# Patient Record
Sex: Female | Born: 1990 | Race: White | Hispanic: No | Marital: Married | Smoking: Former smoker
Health system: Southern US, Community
[De-identification: ages and names within clinical notes are randomized; demographics above are authoritative.]

## PROBLEM LIST (undated history)

## (undated) ENCOUNTER — Inpatient Hospital Stay (HOSPITAL_COMMUNITY): Payer: Self-pay

## (undated) DIAGNOSIS — F50029 Anorexia nervosa, binge eating/purging type, unspecified: Secondary | ICD-10-CM

## (undated) DIAGNOSIS — F5002 Anorexia nervosa, binge eating/purging type: Secondary | ICD-10-CM

## (undated) DIAGNOSIS — F32A Depression, unspecified: Secondary | ICD-10-CM

## (undated) DIAGNOSIS — F329 Major depressive disorder, single episode, unspecified: Secondary | ICD-10-CM

## (undated) DIAGNOSIS — E669 Obesity, unspecified: Secondary | ICD-10-CM

## (undated) DIAGNOSIS — B1920 Unspecified viral hepatitis C without hepatic coma: Secondary | ICD-10-CM

## (undated) DIAGNOSIS — F419 Anxiety disorder, unspecified: Secondary | ICD-10-CM

## (undated) DIAGNOSIS — N750 Cyst of Bartholin's gland: Secondary | ICD-10-CM

## (undated) DIAGNOSIS — E119 Type 2 diabetes mellitus without complications: Secondary | ICD-10-CM

## (undated) DIAGNOSIS — F4310 Post-traumatic stress disorder, unspecified: Secondary | ICD-10-CM

## (undated) HISTORY — PX: TONSILLECTOMY: SUR1361

## (undated) HISTORY — DX: Post-traumatic stress disorder, unspecified: F43.10

## (undated) HISTORY — DX: Cyst of Bartholin's gland: N75.0

## (undated) HISTORY — PX: WISDOM TOOTH EXTRACTION: SHX21

## (undated) HISTORY — DX: Unspecified viral hepatitis C without hepatic coma: B19.20

---
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
## 2012-05-31 ENCOUNTER — Telehealth (INDEPENDENT_AMBULATORY_CARE_PROVIDER_SITE_OTHER): Payer: Self-pay

## 2012-05-31 ENCOUNTER — Encounter (INDEPENDENT_AMBULATORY_CARE_PROVIDER_SITE_OTHER): Payer: Self-pay

## 2012-05-31 ENCOUNTER — Ambulatory Visit (INDEPENDENT_AMBULATORY_CARE_PROVIDER_SITE_OTHER): Payer: Medicaid Other | Admitting: General Surgery

## 2012-05-31 ENCOUNTER — Encounter (INDEPENDENT_AMBULATORY_CARE_PROVIDER_SITE_OTHER): Payer: Self-pay | Admitting: General Surgery

## 2012-05-31 VITALS — BP 126/78 | HR 86 | Resp 18 | Ht 67.0 in | Wt 289.0 lb

## 2012-05-31 DIAGNOSIS — G8929 Other chronic pain: Secondary | ICD-10-CM

## 2012-05-31 DIAGNOSIS — R1011 Right upper quadrant pain: Secondary | ICD-10-CM

## 2012-05-31 MED ORDER — OMEPRAZOLE 20 MG PO CPDR: 20.0000 mg | DELAYED_RELEASE_CAPSULE | Freq: Every day | ORAL | Status: DC

## 2012-05-31 NOTE — Telephone Encounter (Signed)
Called patient with appointment date & time for Hida Scan scheduled for Friday 06/10/12 @ 7:00 am, patient need's to arrive @ 6:45 am, nothing to eat or drink after midnight the night prior to the test also, patient cannot take any pain medication after midnight the night before the test.

## 2012-05-31 NOTE — Progress Notes (Signed)
Patient ID: Crystal Castillo, female   DOB: 10/31/1990, 22 y.o.   MRN: [id number]  Chief Complaint  Patient presents with  . Other    Eval gallbladder    HPI Crystal Castillo is a 22 y.o. female.  This patient was referred by Dr. Cyndia Castillo for evaluation of right upper quadrant pain. She first had this in October and was seen in the emergency room at that time. Ultrasound demonstrated some possible sludge but no obvious gallstones and she was discharged. The pain got better over the next several months but returned about one month ago and now she describes this as a constant "aching" pain located just under her right rib and breast. This is occasionally worsened with eating and is relieved with some Vicodin which was prescribed by her physician but she says that this does not help the nausea and which is associated with pain. She has vomited once. She denies any radiation of discomfort and says that her bowels are normal and denies any blood in stool or melena. She does have some occasional heartburn and takes some of her dad's Prilosec which helped with her heartburn symptoms but is unsure if this has helped with her pain since she does not take this routinely.  HPI  Past Medical History  Diagnosis Date  . Anorexia nervosa with bulimia     just finished treatment in August 2012 for this  . Diabetes mellitus     not currently treated for this. Pt states she was told she had DM, but lost @  150 lbs in 9 months.  . Depression   . Anxiety     Past Surgical History  Procedure Laterality Date  . Tonsillectomy    . Wisdom tooth extraction      History reviewed. No pertinent family history.  Social History History  Substance Use Topics  . Smoking status: Current Every Day Smoker -- 0.50 packs/day    Types: Cigarettes  . Smokeless tobacco: Never Used  . Alcohol Use: Yes     Comment: occasional drinker - 2 or 3 times a week    Allergies  Allergen Reactions  . Sulfa Antibiotics Nausea Only     Current Outpatient Prescriptions  Medication Sig Dispense Refill  . clonazePAM (KLONOPIN) 0.5 MG tablet Take 0.5 mg by mouth 2 (two) times daily as needed. anxiety      . topiramate (TOPAMAX) 100 MG tablet Take 100-200 mg by mouth 2 (two) times daily. Takes 100 mg in the morning and 200 mg at bedtime       . venlafaxine (EFFEXOR-XR) 150 MG 24 hr capsule Take 150 mg by mouth daily.      Marland Kitchen HYDROcodone-acetaminophen (NORCO/VICODIN) 5-325 MG per tablet Take 1 tablet by mouth every 8 (eight) hours as needed for pain.      Marland Kitchen omeprazole (PRILOSEC) 20 MG capsule Take 1 capsule (20 mg total) by mouth daily.  30 capsule  1   No current facility-administered medications for this visit.    Review of Systems Review of Systems All other review of systems negative or noncontributory except as stated in the HPI  Blood pressure 126/78, pulse 86, resp. rate 18, height 5\' 7"  (1.702 m), weight 289 lb (131.09 kg).  Physical Exam Physical Exam Physical Exam  Nursing note and vitals reviewed. Constitutional: She is oriented to person, place, and time. She appears well-developed and well-nourished. No distress.  HENT:  Head: Normocephalic and atraumatic.  Mouth/Throat: No oropharyngeal exudate.  Eyes: Conjunctivae  and EOM are normal. Pupils are equal, round, and reactive to light. Right eye exhibits no discharge. Left eye exhibits no discharge. No scleral icterus.  Neck: Normal range of motion. Neck supple. No tracheal deviation present.  Cardiovascular: Normal rate, regular rhythm, normal heart sounds and intact distal pulses.   Pulmonary/Chest: Effort normal and breath sounds normal. No stridor. No respiratory distress. She has no wheezes.  Abdominal: Soft. Bowel sounds are normal. She exhibits no distension and no mass. There is mild tenderness in the RUQ and right flank. There is no rebound and no guarding.  Musculoskeletal: Normal range of motion. She exhibits no edema and no tenderness.   Neurological: She is alert and oriented to person, place, and time.  Skin: Skin is warm and dry. No rash noted but she has several linear scars on her left arm. She is not diaphoretic. No erythema. No pallor.  Psychiatric: She has a normal mood and affect. Her behavior is normal. Judgment and thought content normal.    Data Reviewed [place], labs, outside records  Assessment    Abdominal pain, biliary dyskinesia GERD I do agree that this is most likely biliary dyskinesia or possibly due to GB sludge. This could be due topeptic ulcer disease or gastritis as well, given the absence of obvious gallstones. I think it is reasonable to obtain a HIDA scan to evaluate for gallbladder function and ejection fraction and if this is abnormal, proceed with cholecystectomy. However, she does have some reflux of air this could be gastritis as well and I will go ahead and prescribe some Prilosec In the meantime. I will see her back in about 2 weeks and we will see how she has done with the Prilosec. If she has improvement with Prilosec and her high scan is negative then we will hold off on surgery for now unless we can get some more objective evidence that this is the gallbladder. If she gets no improvement with Prilosec, and her HIDA scan is negative, then we will have to have a discussion about whether it is worth attempting cholecystectomy for possible relief.       Plan    Prilosec 20 mg daily HIDA scan Followup in 2 weeks        Genevive Printup DAVID 05/31/2012, 11:51 AM

## 2012-06-10 ENCOUNTER — Encounter (HOSPITAL_COMMUNITY)
Admission: RE | Admit: 2012-06-10 | Discharge: 2012-06-10 | Disposition: A | Discharge status: Home / Self Care | Payer: Medicaid Other | Source: Ambulatory Visit | Attending: General Surgery | Admitting: General Surgery

## 2012-06-10 DIAGNOSIS — R1011 Right upper quadrant pain: Secondary | ICD-10-CM

## 2012-06-10 DIAGNOSIS — R109 Unspecified abdominal pain: Secondary | ICD-10-CM | POA: Insufficient documentation

## 2012-06-10 MED ORDER — SINCALIDE 5 MCG IJ SOLR
INTRAMUSCULAR | Status: AC
  Administered 2012-06-10: 2.63 ug via INTRAVENOUS
  Filled 2012-06-10: qty 10

## 2012-06-10 MED ORDER — TECHNETIUM TC 99M MEBROFENIN IV KIT
5.0000 | PACK | Freq: Once | INTRAVENOUS | Status: AC | PRN
  Administered 2012-06-10: 5 via INTRAVENOUS

## 2012-06-10 MED ORDER — SINCALIDE 5 MCG IJ SOLR
0.0200 ug/kg | Freq: Once | INTRAMUSCULAR | Status: AC
  Administered 2012-06-10: 2.63 ug via INTRAVENOUS

## 2012-09-06 ENCOUNTER — Encounter (HOSPITAL_COMMUNITY): Payer: Self-pay | Admitting: Nurse Practitioner

## 2012-09-06 ENCOUNTER — Emergency Department (HOSPITAL_COMMUNITY): Payer: Medicaid Other

## 2012-09-06 ENCOUNTER — Emergency Department (HOSPITAL_COMMUNITY)
Admission: EM | Admit: 2012-09-06 | Discharge: 2012-09-06 | Disposition: A | Discharge status: Home / Self Care | Payer: Medicaid Other | Attending: Emergency Medicine | Admitting: Emergency Medicine

## 2012-09-06 DIAGNOSIS — R5381 Other malaise: Secondary | ICD-10-CM | POA: Insufficient documentation

## 2012-09-06 DIAGNOSIS — Z79899 Other long term (current) drug therapy: Secondary | ICD-10-CM | POA: Insufficient documentation

## 2012-09-06 DIAGNOSIS — R739 Hyperglycemia, unspecified: Secondary | ICD-10-CM

## 2012-09-06 DIAGNOSIS — R300 Dysuria: Secondary | ICD-10-CM | POA: Insufficient documentation

## 2012-09-06 DIAGNOSIS — R110 Nausea: Secondary | ICD-10-CM | POA: Insufficient documentation

## 2012-09-06 DIAGNOSIS — Z8659 Personal history of other mental and behavioral disorders: Secondary | ICD-10-CM | POA: Insufficient documentation

## 2012-09-06 DIAGNOSIS — R109 Unspecified abdominal pain: Secondary | ICD-10-CM | POA: Insufficient documentation

## 2012-09-06 DIAGNOSIS — Z3202 Encounter for pregnancy test, result negative: Secondary | ICD-10-CM | POA: Insufficient documentation

## 2012-09-06 DIAGNOSIS — N390 Urinary tract infection, site not specified: Secondary | ICD-10-CM | POA: Insufficient documentation

## 2012-09-06 DIAGNOSIS — M545 Low back pain, unspecified: Secondary | ICD-10-CM | POA: Insufficient documentation

## 2012-09-06 DIAGNOSIS — E1169 Type 2 diabetes mellitus with other specified complication: Secondary | ICD-10-CM | POA: Insufficient documentation

## 2012-09-06 DIAGNOSIS — F5000 Anorexia nervosa, unspecified: Secondary | ICD-10-CM | POA: Insufficient documentation

## 2012-09-06 DIAGNOSIS — R61 Generalized hyperhidrosis: Secondary | ICD-10-CM | POA: Insufficient documentation

## 2012-09-06 DIAGNOSIS — F17200 Nicotine dependence, unspecified, uncomplicated: Secondary | ICD-10-CM | POA: Insufficient documentation

## 2012-09-06 DIAGNOSIS — R0789 Other chest pain: Secondary | ICD-10-CM | POA: Insufficient documentation

## 2012-09-06 DIAGNOSIS — R6883 Chills (without fever): Secondary | ICD-10-CM | POA: Insufficient documentation

## 2012-09-06 LAB — URINE MICROSCOPIC-ADD ON

## 2012-09-06 LAB — POCT I-STAT, CHEM 8
Creatinine, Ser: 0.6 mg/dL (ref 0.50–1.10)
Glucose, Bld: 235 mg/dL — ABNORMAL HIGH (ref 70–99)
Hemoglobin: 13.3 g/dL (ref 12.0–15.0)
TCO2: 23 mmol/L (ref 0–100)

## 2012-09-06 LAB — CBC
Hemoglobin: 13.5 g/dL (ref 12.0–15.0)
MCH: 28.5 pg (ref 26.0–34.0)
Platelets: 256 10*3/uL (ref 150–400)
RBC: 4.73 MIL/uL (ref 3.87–5.11)
WBC: 9.6 10*3/uL (ref 4.0–10.5)

## 2012-09-06 LAB — URINALYSIS, ROUTINE W REFLEX MICROSCOPIC
Bilirubin Urine: NEGATIVE
Nitrite: NEGATIVE
Specific Gravity, Urine: 1.031 — ABNORMAL HIGH (ref 1.005–1.030)
Urobilinogen, UA: 0.2 mg/dL (ref 0.0–1.0)

## 2012-09-06 LAB — POCT I-STAT TROPONIN I: Troponin i, poc: 0 ng/mL (ref 0.00–0.08)

## 2012-09-06 LAB — COMPREHENSIVE METABOLIC PANEL
AST: 12 U/L (ref 0–37)
Albumin: 3.4 g/dL — ABNORMAL LOW (ref 3.5–5.2)
Alkaline Phosphatase: 88 U/L (ref 39–117)
BUN: 8 mg/dL (ref 6–23)
CO2: 22 mEq/L (ref 19–32)
Chloride: 98 mEq/L (ref 96–112)
GFR calc non Af Amer: >90 mL/min (ref >90)
Potassium: 3.9 mEq/L (ref 3.5–5.1)
Total Bilirubin: 0.3 mg/dL (ref 0.3–1.2)

## 2012-09-06 LAB — PRO B NATRIURETIC PEPTIDE: Pro B Natriuretic peptide (BNP): 55.4 pg/mL (ref 0–125)

## 2012-09-06 MED ORDER — IBUPROFEN 800 MG PO TABS: 800.0000 mg | ORAL_TABLET | Freq: Three times a day (TID) | ORAL | Status: DC

## 2012-09-06 MED ORDER — HYDROCODONE-ACETAMINOPHEN 5-325 MG PO TABS
1.0000 | ORAL_TABLET | Freq: Once | ORAL | Status: AC
  Administered 2012-09-06: 1 via ORAL
  Filled 2012-09-06: qty 1

## 2012-09-06 MED ORDER — SODIUM CHLORIDE 0.9 % IV BOLUS (SEPSIS)
1500.0000 mL | Freq: Once | INTRAVENOUS | Status: AC
  Administered 2012-09-06: 1500 mL via INTRAVENOUS

## 2012-09-06 MED ORDER — CYCLOBENZAPRINE HCL 10 MG PO TABS: 10.0000 mg | ORAL_TABLET | Freq: Two times a day (BID) | ORAL | Status: DC | PRN

## 2012-09-06 MED ORDER — METFORMIN HCL 500 MG PO TABS: 500.0000 mg | ORAL_TABLET | Freq: Two times a day (BID) | ORAL | Status: DC

## 2012-09-06 MED ORDER — CEPHALEXIN 500 MG PO CAPS: 500.0000 mg | ORAL_CAPSULE | Freq: Four times a day (QID) | ORAL | Status: DC

## 2012-09-06 MED ORDER — SODIUM CHLORIDE 0.9 % IV BOLUS (SEPSIS)
500.0000 mL | Freq: Once | INTRAVENOUS | Status: AC
  Administered 2012-09-06: 500 mL via INTRAVENOUS

## 2012-09-06 MED ORDER — HYDROCODONE-ACETAMINOPHEN 5-325 MG PO TABS
2.0000 | ORAL_TABLET | Freq: Once | ORAL | Status: AC
  Administered 2012-09-06: 2 via ORAL
  Filled 2012-09-06: qty 2

## 2012-09-06 NOTE — ED Provider Notes (Signed)
History    CSN: [id number] Arrival date & time 09/06/12  1429  First MD Initiated Contact with Patient 09/06/12 1508     Chief Complaint  Patient presents with  . Chest Pain    pain with deep breath   (Consider location/radiation/quality/duration/timing/severity/associated sxs/prior Treatment) Patient is a 22 y.o. female presenting with chest pain. The history is provided by the patient and medical records. No language interpreter was used.  Chest Pain Associated symptoms: back pain (low)   Associated symptoms: no abdominal pain, no cough, no diaphoresis, no fatigue, no fever, no headache, no nausea, no shortness of breath and not vomiting     Crystal Castillo is a 22 y.o. female  with a hx of DM (uncontrolled), bulimia, depression, anxiety presents to the Emergency Department complaining of gradual, persistent, progressively worsening riht sided chest pain which radiates to her back.  Pt states the pain began about 11PM while lying in bed withj associated sweating and chills, nausea, myalgias, diarrhea (x1 last PM with normal stool this AM).  Deep breathing increased her pain, and sleep  Made the pain better.  Pain persisted with breathing this morning and has persisted as an ache in the right side.  Pt also c/o dysuria, flank pain, low back pain for 2 weeks.  Vaginal wipes and pyridium both help with the symptoms and nothing makes them worse.  Pt denies fever, SOB, abd pain, nausea, vomiting, weakness, dizziness, syncope, hematuria.  Pt had nexplanon put in in May and has not had a period since that time.     Past Medical History  Diagnosis Date  . Anorexia nervosa with bulimia     just finished treatment in August 2012 for this  . Diabetes mellitus     not currently treated for this. Pt states she was told she had DM, but lost @  150 lbs in 9 months.  . Depression   . Anxiety    Past Surgical History  Procedure Laterality Date  . Tonsillectomy    . Wisdom tooth extraction      Family History  Problem Relation Age of Onset  . Diabetes Mother    History  Substance Use Topics  . Smoking status: Current Every Day Smoker -- 0.50 packs/day    Types: Cigarettes  . Smokeless tobacco: Never Used  . Alcohol Use: Yes     Comment: occasional drinker - 2 or 3 times a week   OB History   Grav Para Term Preterm Abortions TAB SAB Ect Mult Living            0     Review of Systems  Constitutional: Positive for chills. Negative for fever, diaphoresis, appetite change, fatigue and unexpected weight change.  HENT: Negative for mouth sores and neck stiffness.   Eyes: Negative for visual disturbance.  Respiratory: Negative for cough, chest tightness, shortness of breath and wheezing.   Cardiovascular: Positive for chest pain.  Gastrointestinal: Negative for nausea, vomiting, abdominal pain, diarrhea and constipation.  Endocrine: Negative for polydipsia, polyphagia and polyuria.  Genitourinary: Positive for dysuria, urgency, frequency and flank pain. Negative for hematuria.  Musculoskeletal: Positive for back pain (low).  Skin: Negative for rash.  Allergic/Immunologic: Negative for immunocompromised state.  Neurological: Negative for syncope, light-headedness and headaches.  Hematological: Does not bruise/bleed easily.  Psychiatric/Behavioral: Negative for sleep disturbance. The patient is not nervous/anxious.     Allergies  Sulfa antibiotics  Home Medications   Current Outpatient Rx  Name  Route  Sig  Dispense  Refill  . clonazePAM (KLONOPIN) 0.5 MG tablet   Oral   Take 0.5 mg by mouth 2 (two) times daily as needed for anxiety.          Marland Kitchen etonogestrel (NEXPLANON) 68 MG IMPL implant   Subcutaneous   Inject 1 each into the skin once.         . Multiple Vitamin (MULTIVITAMIN WITH MINERALS) TABS   Oral   Take 1 tablet by mouth every morning.         . naproxen sodium (ANAPROX) 220 MG tablet   Oral   Take 440 mg by mouth 2 (two) times daily as needed  (pain).         . cephALEXin (KEFLEX) 500 MG capsule   Oral   Take 1 capsule (500 mg total) by mouth 4 (four) times daily.   40 capsule   0   . cyclobenzaprine (FLEXERIL) 10 MG tablet   Oral   Take 1 tablet (10 mg total) by mouth 2 (two) times daily as needed for muscle spasms.   20 tablet   0   . ibuprofen (ADVIL,MOTRIN) 800 MG tablet   Oral   Take 1 tablet (800 mg total) by mouth 3 (three) times daily.   21 tablet   0   . metFORMIN (GLUCOPHAGE) 500 MG tablet   Oral   Take 1 tablet (500 mg total) by mouth 2 (two) times daily with a meal.   60 tablet   1    BP 130/54  Pulse 109  Temp(Src) 98.1 F (36.7 C) (Oral)  Resp 18  Ht 5\' 7"  (1.702 m)  Wt 300 lb (136.079 kg)  BMI 46.98 kg/m2  SpO2 96%  LMP 06/19/2012 Physical Exam  Nursing note and vitals reviewed. Constitutional: She is oriented to person, place, and time. She appears well-developed and well-nourished. No distress.  Awake, alert, nontoxic appearance Obese  HENT:  Head: Normocephalic and atraumatic.  Mouth/Throat: Oropharynx is clear and moist. No oropharyngeal exudate.  Eyes: Conjunctivae and EOM are normal. Pupils are equal, round, and reactive to light. No scleral icterus.  Neck: Normal range of motion. Neck supple.  Cardiovascular: Normal rate, regular rhythm, normal heart sounds and intact distal pulses.   No murmur heard. Pulmonary/Chest: Effort normal and breath sounds normal. No respiratory distress. She has no wheezes. She has no rales.  Abdominal: Soft. Bowel sounds are normal. She exhibits no distension and no mass. There is no tenderness. There is no rebound and no guarding.  Musculoskeletal: Normal range of motion. She exhibits no edema.  Neurological: She is alert and oriented to person, place, and time. She exhibits normal muscle tone. Coordination normal.  Speech is clear and goal oriented Moves extremities without ataxia  Skin: Skin is warm and dry. She is not diaphoretic.  Well-healed  marks from cutting on the left arm  Psychiatric: She has a normal mood and affect. Her behavior is normal.    ED Course  Procedures (including critical care time) Labs Reviewed  URINALYSIS, ROUTINE W REFLEX MICROSCOPIC - Abnormal; Notable for the following:    APPearance CLOUDY (*)    Specific Gravity, Urine 1.031 (*)    Glucose, UA >1000 (*)    Hgb urine dipstick TRACE (*)    Leukocytes, UA SMALL (*)    All other components within normal limits  COMPREHENSIVE METABOLIC PANEL - Abnormal; Notable for the following:    Glucose, Bld 378 (*)    Albumin 3.4 (*)  All other components within normal limits  POCT I-STAT, CHEM 8 - Abnormal; Notable for the following:    Glucose, Bld 235 (*)    Calcium, Ion 1.09 (*)    All other components within normal limits  CBC  PRO B NATRIURETIC PEPTIDE  URINE MICROSCOPIC-ADD ON  POCT PREGNANCY, URINE  POCT I-STAT TROPONIN I   ECG:  Date: 09/06/2012  Rate: 117  Rhythm: sinus tachycardia  QRS Axis: normal  Intervals: normal  ST/T Wave abnormalities: normal  Conduction Disutrbances:none  Narrative Interpretation: sinus tachycardia, nonischemic ECG, unchanged from preious on 12/09/09  Old EKG Reviewed: unchanged    Dg Chest 2 View  09/06/2012   *RADIOLOGY REPORT*  Clinical Data: Chest pain, shortness of breath  CHEST - 2 VIEW  Comparison:  None.  Findings:  The heart size and mediastinal contours are within normal limits.  Both lungs are clear.  The visualized skeletal structures are unremarkable.  IMPRESSION: No active cardiopulmonary disease.   Original Report Authenticated By: Judie Petit. Shick, M.D.   1. Hyperglycemia without ketosis   2. UTI (lower urinary tract infection)   3. Chest wall pain   4. Atypical chest pain     MDM  Crystal Castillo presents with chest pain made worse with movement.  Pt low risk for cardiac causes and likely chest wall pain based on presentation but will proceed with cardiac work-up.  Pt also with urinary complaints.   A-febrile, without CVA tenderness, unlikely pyelonephritis. Pt also with hx of DM, uncontrolled; will assess for DKA.  Will give fluids and pain control and reassess.    6:18 PM Patient with evidence of urinary tract infection.  BMP within normal limits, CBC without leukocytosis, troponin negative and ECG nonischemic, pregnancy test negative.  CMP with hyperglycemia 378 and anion gap of 16. No ketones and patient's urine. We'll give  Fluids.  Chest x-ray without evidence of infiltration, pneumothorax or cardiomegaly.      8:20 PM Pt with repeat chem 8 just prior to 2L bolus completion with AG of 13.  NO evidence of DKA.  Pt states her CP decreased after narcotic administration.  Will d/c home with medications for chest wall pain and have her f/u with PCP for glucose monitoring.  Will also begin pt with metformin.  Pt to be d/c with abx for UTI.  I personally reviewed the imaging tests through PACS system.  I reviewed available ER/hospitalization records through the EMR.    I explained the diagnosis and have given explicit precautions to return to the ER including for any other new or worsening symptoms. The patient understands and accepts the medical plan as it's been dictated and I have answered their questions. Discharge instructions concerning home care and prescriptions have been given. The patient is STABLE and is discharged to home in good condition.  Dr. Jeraldine Loots was consulted, evaluated this patient with me and agrees with the plan.      Dahlia Client Sylva Overley, PA-C 09/06/12 2021

## 2012-09-06 NOTE — ED Notes (Signed)
Patient transported to X-ray 

## 2012-09-06 NOTE — ED Notes (Addendum)
Per pt:  Last night around 11:30, pt began having sharp chest pains on the right side associated with some nausea, chills, sweating and malaise (achy).  Pt did not take any medication.  Pt states that she has had frequent small amounts of urination, lower back pain and cloudy urine; pt is to see her MD tomorrow for the possible UTI and regular check up.  Months ago, pt had a workup for possible "sludge" build up in gallbladder.  Pt had a test done and she never received information regarding the gallbladder test.  Pt is diabetic and has not been checking glucose for about a year.  Pt is concerned that the frequent urinating may be associated with diabetes.

## 2012-09-06 NOTE — ED Notes (Signed)
Spoke with Danielle in lab; she is running the CMP from the prior blood sent down.

## 2012-09-06 NOTE — ED Provider Notes (Signed)
Medical screening examination/treatment/procedure(s) were conducted as a shared visit with non-physician practitioner(s) and myself.  I personally evaluated the patient during the encounter On my exam this young F w awake, alert, but seemingly uncomfortable.  She had notable hyperglycemia, mild anion gap and glucosuria.  She received IVF, was improved, and d/c to f/u w PMD.  Gerhard Munch, MD 09/06/12 2211

## 2013-12-22 ENCOUNTER — Emergency Department (HOSPITAL_COMMUNITY)
Admission: EM | Admit: 2013-12-22 | Discharge: 2013-12-22 | Disposition: A | Discharge status: Home / Self Care | Payer: Self-pay | Attending: Emergency Medicine | Admitting: Emergency Medicine

## 2013-12-22 ENCOUNTER — Emergency Department (HOSPITAL_COMMUNITY): Payer: Self-pay

## 2013-12-22 ENCOUNTER — Encounter (HOSPITAL_COMMUNITY): Payer: Self-pay | Admitting: *Deleted

## 2013-12-22 DIAGNOSIS — E119 Type 2 diabetes mellitus without complications: Secondary | ICD-10-CM | POA: Insufficient documentation

## 2013-12-22 DIAGNOSIS — R05 Cough: Secondary | ICD-10-CM

## 2013-12-22 DIAGNOSIS — Z720 Tobacco use: Secondary | ICD-10-CM | POA: Insufficient documentation

## 2013-12-22 DIAGNOSIS — Z792 Long term (current) use of antibiotics: Secondary | ICD-10-CM | POA: Insufficient documentation

## 2013-12-22 DIAGNOSIS — J40 Bronchitis, not specified as acute or chronic: Secondary | ICD-10-CM

## 2013-12-22 DIAGNOSIS — R059 Cough, unspecified: Secondary | ICD-10-CM

## 2013-12-22 DIAGNOSIS — R112 Nausea with vomiting, unspecified: Secondary | ICD-10-CM | POA: Insufficient documentation

## 2013-12-22 DIAGNOSIS — Z79899 Other long term (current) drug therapy: Secondary | ICD-10-CM | POA: Insufficient documentation

## 2013-12-22 DIAGNOSIS — J209 Acute bronchitis, unspecified: Secondary | ICD-10-CM | POA: Insufficient documentation

## 2013-12-22 DIAGNOSIS — E669 Obesity, unspecified: Secondary | ICD-10-CM | POA: Insufficient documentation

## 2013-12-22 HISTORY — DX: Obesity, unspecified: E66.9

## 2013-12-22 HISTORY — DX: Type 2 diabetes mellitus without complications: E11.9

## 2013-12-22 LAB — CBG MONITORING, ED: GLUCOSE-CAPILLARY: 105 mg/dL — AB (ref 70–99)

## 2013-12-22 MED ORDER — HYDROCOD POLST-CHLORPHEN POLST 10-8 MG/5ML PO LQCR
5.0000 mL | Freq: Once | ORAL | Status: AC
  Administered 2013-12-22: 5 mL via ORAL

## 2013-12-22 MED ORDER — HYDROCOD POLST-CHLORPHEN POLST 10-8 MG/5ML PO LQCR
5.0000 mL | Freq: Three times a day (TID) | ORAL | Status: DC | PRN
  Filled 2013-12-22: qty 5

## 2013-12-22 MED ORDER — HYDROCODONE-HOMATROPINE 5-1.5 MG/5ML PO SYRP: 5.0000 mL | ORAL_SOLUTION | ORAL | Status: DC | PRN

## 2013-12-22 MED ORDER — CEPHALEXIN 500 MG PO CAPS: 500.0000 mg | ORAL_CAPSULE | Freq: Three times a day (TID) | ORAL | Status: DC

## 2013-12-22 MED ORDER — HYDROCODONE-HOMATROPINE 5-1.5 MG/5ML PO SYRP: 5.0000 mL | ORAL_SOLUTION | Freq: Four times a day (QID) | ORAL | Status: DC | PRN

## 2013-12-22 NOTE — Discharge Instructions (Signed)
Cough, Adult ° A cough is a reflex that helps clear your throat and airways. It can help heal the body or may be a reaction to an irritated airway. A cough may only last 2 or 3 weeks (acute) or may last more than 8 weeks (chronic).  °CAUSES °Acute cough: °· Viral or bacterial infections. °Chronic cough: °· Infections. °· Allergies. °· Asthma. °· Post-nasal drip. °· Smoking. °· Heartburn or acid reflux. °· Some medicines. °· Chronic lung problems (COPD). °· Cancer. °SYMPTOMS  °· Cough. °· Fever. °· Chest pain. °· Increased breathing rate. °· High-pitched whistling sound when breathing (wheezing). °· Colored mucus that you cough up (sputum). °TREATMENT  °· A bacterial cough may be treated with antibiotic medicine. °· A viral cough must run its course and will not respond to antibiotics. °· Your caregiver may recommend other treatments if you have a chronic cough. °HOME CARE INSTRUCTIONS  °· Only take over-the-counter or prescription medicines for pain, discomfort, or fever as directed by your caregiver. Use cough suppressants only as directed by your caregiver. °· Use a cold steam vaporizer or humidifier in your bedroom or home to help loosen secretions. °· Sleep in a semi-upright position if your cough is worse at night. °· Rest as needed. °· Stop smoking if you smoke. °SEEK IMMEDIATE MEDICAL CARE IF:  °· You have pus in your sputum. °· Your cough starts to worsen. °· You cannot control your cough with suppressants and are losing sleep. °· You begin coughing up blood. °· You have difficulty breathing. °· You develop pain which is getting worse or is uncontrolled with medicine. °· You have a fever. °MAKE SURE YOU:  °· Understand these instructions. °· Will watch your condition. °· Will get help right away if you are not doing well or get worse. °Document Released: 08/01/2010 Document Revised: 04/27/2011 Document Reviewed: 08/01/2010 °ExitCare® Patient Information ©2015 [employer]. This information is not intended  to replace advice given to you by your health care provider. Make sure you discuss any questions you have with your health care provider. ° °Fever, Adult °A fever is a higher than normal body temperature. In an adult, an oral temperature around 98.6° F (37° C) is considered normal. A temperature of 100.4° F (38° C) or higher is generally considered a fever. Mild or moderate fevers generally have no long-term effects and often do not require treatment. Extreme fever (greater than or equal to 106° F or 41.1° C) can cause seizures. The sweating that may occur with repeated or prolonged fever may cause dehydration. Elderly people can develop confusion during a fever. °A measured temperature can vary with: °· Age. °· Time of day. °· Method of measurement (mouth, underarm, rectal, or ear). °The fever is confirmed by taking a temperature with a thermometer. Temperatures can be taken different ways. Some methods are accurate and some are not. °· An oral temperature is used most commonly. Electronic thermometers are fast and accurate. °· An ear temperature will only be accurate if the thermometer is positioned as recommended by the manufacturer. °· A rectal temperature is accurate and done for those adults who have a condition where an oral temperature cannot be taken. °· An underarm (axillary) temperature is not accurate and not recommended. °Fever is a symptom, not a disease.  °CAUSES  °· Infections commonly cause fever. °· Some noninfectious causes for fever include: °¨ Some arthritis conditions. °¨ Some thyroid or adrenal gland conditions. °¨ Some immune system conditions. °¨ Some types of cancer. °¨   A medicine reaction. °¨ High doses of certain street drugs such as methamphetamine. °¨ Dehydration. °¨ Exposure to high outside or room temperatures. °· Occasionally, the source of a fever cannot be determined. This is sometimes called a "fever of unknown origin" (FUO). °· Some situations may lead to a temporary rise in body  temperature that may go away on its own. Examples are: °¨ Childbirth. °¨ Surgery. °¨ Intense exercise. °HOME CARE INSTRUCTIONS  °· Take appropriate medicines for fever. Follow dosing instructions carefully. If you use acetaminophen to reduce the fever, be careful to avoid taking other medicines that also contain acetaminophen. Do not take aspirin for a fever if you are younger than age 19. There is an association with Reye's syndrome. Reye's syndrome is a rare but potentially deadly disease. °· If an infection is present and antibiotics have been prescribed, take them as directed. Finish them even if you start to feel better. °· Rest as needed. °· Maintain an adequate fluid intake. To prevent dehydration during an illness with prolonged or recurrent fever, you may need to drink extra fluid. Drink enough fluids to keep your urine clear or pale yellow. °· Sponging or bathing with room temperature water may help reduce body temperature. Do not use ice water or alcohol sponge baths. °· Dress comfortably, but do not over-bundle. °SEEK MEDICAL CARE IF:  °· You are unable to keep fluids down. °· You develop vomiting or diarrhea. °· You are not feeling at least partly better after 3 days. °· You develop new symptoms or problems. °SEEK IMMEDIATE MEDICAL CARE IF:  °· You have shortness of breath or trouble breathing. °· You develop excessive weakness. °· You are dizzy or you faint. °· You are extremely thirsty or you are making little or no urine. °· You develop new pain that was not there before (such as in the head, neck, chest, back, or abdomen). °· You have persistent vomiting and diarrhea for more than 1 to 2 days. °· You develop a stiff neck or your eyes become sensitive to light. °· You develop a skin rash. °· You have a fever or persistent symptoms for more than 2 to 3 days. °· You have a fever and your symptoms suddenly get worse. °MAKE SURE YOU:  °· Understand these instructions. °· Will watch your  condition. °· Will get help right away if you are not doing well or get worse. °Document Released: 07/29/2000 Document Revised: 06/19/2013 Document Reviewed: 12/04/2010 °ExitCare® Patient Information ©2015 [employer]. This information is not intended to replace advice given to you by your health care provider. Make sure you discuss any questions you have with your health care provider. ° °

## 2013-12-22 NOTE — ED Provider Notes (Signed)
CSN: [id number]     Arrival date & time 12/22/13  1347 History   First MD Initiated Contact with Patient 12/22/13 1619     Chief Complaint  Patient presents with  . Cough  . Fever      HPI Pt reports not feeling well for several days. Having sore throat, fever/chills, bodyaches, n/v and productive cough with green sputum. Past Medical History  Diagnosis Date  . Obesity   . Diabetes mellitus without complication    History reviewed. No pertinent past surgical history. History reviewed. No pertinent family history. History  Substance Use Topics  . Smoking status: Current Every Day Smoker    Types: Cigarettes  . Smokeless tobacco: Not on file  . Alcohol Use: Yes     Comment: occ   OB History    No data available     Review of Systems  Constitutional: Negative for fever, chills and unexpected weight change.  HENT: Negative for rhinorrhea and sinus pressure.   Gastrointestinal: Negative for nausea and vomiting.  All other systems reviewed and are negative.    All other systems reviewed and are negative Allergies  Sulfa antibiotics  Home Medications   Prior to Admission medications   Medication Sig Start Date End Date Taking? Authorizing Provider  metFORMIN (GLUCOPHAGE) 500 MG tablet Take 500 mg by mouth 2 (two) times daily with a meal.   Yes Historical Provider, MD  cephALEXin (KEFLEX) 500 MG capsule Take 1 capsule (500 mg total) by mouth 3 (three) times daily. 12/22/13   Nelia Shiobert L Ancil Dewan, MD  HYDROcodone-homatropine [facility](HYCODAN) 5-1.5 MG/5ML syrup Take 5 mLs by mouth every 6 (six) hours as needed for cough. 12/22/13   Nelia Shiobert L Ciana Simmon, MD   BP 133/77 mmHg  Pulse 107  Temp(Src) 97.6 F (36.4 C) (Oral)  Resp 19  SpO2 96% Physical Exam Physical Exam  Nursing note and vitals reviewed. Constitutional: She is oriented to person, place, and time. She appears well-developed and well-nourished. No distress.  HENT:  Head: Normocephalic and atraumatic.  Eyes: Pupils are  equal, round, and reactive to light.  Neck: Normal range of motion.  Cardiovascular: Normal rate and intact distal pulses.   Pulmonary/Chest: No respiratory distress. scattered expiratory wheezes in all lung fields.  No use of accessory muscles. Abdominal: Normal appearance. She exhibits no distension.  Musculoskeletal: Normal range of motion.  Neurological: She is alert and oriented to person, place, and time. No cranial nerve deficit.  Skin: Skin is warm and dry. No rash noted.  Psychiatric: She has a normal mood and affect. Her behavior is normal.   ED Course  Procedures (including critical care time) Labs Review Labs Reviewed  CBG MONITORING, ED - Abnormal; Notable for the following:    Glucose-Capillary 105 (*)    All other components within normal limits    Imaging Review Results for orders placed or performed during the hospital encounter of 12/22/13  POC CBG, ED  Result Value Ref Range   Glucose-Capillary 105 (H) 70 - 99 mg/dL   Dg Chest 2 View  40/9/811911/07/2013   CLINICAL DATA:  Ten day history of cough, worsening over the last 4 days. Patient had fever several days ago. Patient also complaining of nausea and vomiting today. Sore throat, head pressure and dizziness today. History of hypertension, diabetes and smoking.  EXAM: CHEST  2 VIEW  COMPARISON:  None.  FINDINGS: Normal heart, mediastinum hila.  Lungs are clear.  No pleural effusion or pneumothorax.  Bony thorax is intact.  IMPRESSION: No active cardiopulmonary disease.   Electronically Signed   By: Amie Portlandavid  Ormond M.D.   On: 12/22/2013 14:59       MDM   Final diagnoses:  Bronchitis        Nelia Shiobert L Jordann Grime, MD 12/24/13 [phone]

## 2013-12-22 NOTE — ED Notes (Signed)
MD at bedside. 

## 2013-12-22 NOTE — ED Notes (Signed)
Pt reports not feeling well for several days. Having sore throat, fever/chills, bodyaches, n/v and productive cough with green sputum. Airway intact at triage.

## 2013-12-22 NOTE — ED Notes (Signed)
Pt getting undressed and into a gown at this time; family at bedside; Amy, NT and ED, RN aware

## 2014-01-05 ENCOUNTER — Encounter (HOSPITAL_COMMUNITY): Payer: Self-pay | Admitting: Nurse Practitioner

## 2015-01-09 ENCOUNTER — Emergency Department (HOSPITAL_COMMUNITY)
Admission: EM | Admit: 2015-01-09 | Discharge: 2015-01-09 | Disposition: A | Discharge status: Home / Self Care | Payer: Medicaid Other | Attending: Emergency Medicine | Admitting: Emergency Medicine

## 2015-01-09 ENCOUNTER — Encounter (HOSPITAL_COMMUNITY): Payer: Self-pay | Admitting: Emergency Medicine

## 2015-01-09 DIAGNOSIS — Z791 Long term (current) use of non-steroidal anti-inflammatories (NSAID): Secondary | ICD-10-CM | POA: Insufficient documentation

## 2015-01-09 DIAGNOSIS — Z3202 Encounter for pregnancy test, result negative: Secondary | ICD-10-CM | POA: Insufficient documentation

## 2015-01-09 DIAGNOSIS — M545 Low back pain, unspecified: Secondary | ICD-10-CM

## 2015-01-09 DIAGNOSIS — Z79899 Other long term (current) drug therapy: Secondary | ICD-10-CM | POA: Insufficient documentation

## 2015-01-09 DIAGNOSIS — F419 Anxiety disorder, unspecified: Secondary | ICD-10-CM | POA: Insufficient documentation

## 2015-01-09 DIAGNOSIS — F17210 Nicotine dependence, cigarettes, uncomplicated: Secondary | ICD-10-CM | POA: Insufficient documentation

## 2015-01-09 DIAGNOSIS — Z792 Long term (current) use of antibiotics: Secondary | ICD-10-CM | POA: Insufficient documentation

## 2015-01-09 DIAGNOSIS — F329 Major depressive disorder, single episode, unspecified: Secondary | ICD-10-CM | POA: Insufficient documentation

## 2015-01-09 DIAGNOSIS — R197 Diarrhea, unspecified: Secondary | ICD-10-CM | POA: Insufficient documentation

## 2015-01-09 DIAGNOSIS — R109 Unspecified abdominal pain: Secondary | ICD-10-CM | POA: Insufficient documentation

## 2015-01-09 DIAGNOSIS — R000 Tachycardia, unspecified: Secondary | ICD-10-CM | POA: Insufficient documentation

## 2015-01-09 DIAGNOSIS — R112 Nausea with vomiting, unspecified: Secondary | ICD-10-CM | POA: Insufficient documentation

## 2015-01-09 DIAGNOSIS — E119 Type 2 diabetes mellitus without complications: Secondary | ICD-10-CM | POA: Insufficient documentation

## 2015-01-09 LAB — BASIC METABOLIC PANEL
ANION GAP: 9 (ref 5–15)
BUN: 10 mg/dL (ref 6–20)
CALCIUM: 8.7 mg/dL — AB (ref 8.9–10.3)
CO2: 24 mmol/L (ref 22–32)
Chloride: 106 mmol/L (ref 101–111)
Creatinine, Ser: 0.58 mg/dL (ref 0.44–1.00)
Glucose, Bld: 148 mg/dL — ABNORMAL HIGH (ref 65–99)
POTASSIUM: 3.8 mmol/L (ref 3.5–5.1)
SODIUM: 139 mmol/L (ref 135–145)

## 2015-01-09 LAB — URINALYSIS, ROUTINE W REFLEX MICROSCOPIC
Bilirubin Urine: NEGATIVE
Glucose, UA: NEGATIVE mg/dL
Hgb urine dipstick: NEGATIVE
KETONES UR: NEGATIVE mg/dL
LEUKOCYTES UA: NEGATIVE
NITRITE: NEGATIVE
PROTEIN: NEGATIVE mg/dL
Specific Gravity, Urine: 1.031 — ABNORMAL HIGH (ref 1.005–1.030)
pH: 6.5 (ref 5.0–8.0)

## 2015-01-09 LAB — CBC
HEMATOCRIT: 44.4 % (ref 36.0–46.0)
HEMOGLOBIN: 15.3 g/dL — AB (ref 12.0–15.0)
MCH: 31.9 pg (ref 26.0–34.0)
MCHC: 34.5 g/dL (ref 30.0–36.0)
MCV: 92.7 fL (ref 78.0–100.0)
Platelets: 269 10*3/uL (ref 150–400)
RBC: 4.79 MIL/uL (ref 3.87–5.11)
RDW: 12.3 % (ref 11.5–15.5)
WBC: 10 10*3/uL (ref 4.0–10.5)

## 2015-01-09 LAB — I-STAT BETA HCG BLOOD, ED (MC, WL, AP ONLY)

## 2015-01-09 MED ORDER — MORPHINE SULFATE (PF) 4 MG/ML IV SOLN
4.0000 mg | Freq: Once | INTRAVENOUS | Status: AC
  Administered 2015-01-09: 4 mg via INTRAVENOUS
  Filled 2015-01-09: qty 1

## 2015-01-09 MED ORDER — SODIUM CHLORIDE 0.9 % IV BOLUS (SEPSIS)
1000.0000 mL | Freq: Once | INTRAVENOUS | Status: AC
  Administered 2015-01-09: 1000 mL via INTRAVENOUS

## 2015-01-09 MED ORDER — ONDANSETRON HCL 4 MG/2ML IJ SOLN
4.0000 mg | Freq: Once | INTRAMUSCULAR | Status: AC
  Administered 2015-01-09: 4 mg via INTRAVENOUS
  Filled 2015-01-09: qty 2

## 2015-01-09 MED ORDER — METHOCARBAMOL 500 MG PO TABS: 500.0000 mg | ORAL_TABLET | Freq: Two times a day (BID) | ORAL | Status: DC

## 2015-01-09 MED ORDER — NAPROXEN SODIUM 220 MG PO TABS: 440.0000 mg | ORAL_TABLET | Freq: Two times a day (BID) | ORAL | Status: DC | PRN

## 2015-01-09 NOTE — ED Notes (Signed)
Pt ambulated to RR unassisted 

## 2015-01-09 NOTE — ED Provider Notes (Signed)
CSN: [id number]     Arrival date & time 01/09/15  1124 History   First MD Initiated Contact with Patient 01/09/15 1140     Chief Complaint  Patient presents with  . Flank Pain  . Abdominal Pain  . Emesis  . Diarrhea     (Consider location/radiation/quality/duration/timing/severity/associated sxs/prior Treatment) HPI   24 year old obese female with history of non-insulin-dependent diabetes, depression, anxiety presenting with back pain. Patient woke up this morning with sharp pain to her right low back. Pain is persistent, worsening with movement and no specific treatment tried. Pain is currently moderate in intensity and nonradiating. For the past several days she has been having decrease in appetite with occasional nausea and vomiting. She denies having any fever, chills, lightheadedness, chest pain, shortness of breath, productive cough, dysuria, hematuria, vaginal bleeding, vaginal discharge. Her last menstrual period was November 11. Patient felt symptoms reminded her of a muscle pull but denies any specific injury. She report remote history of kidney injury due to anorexia.  Past Medical History  Diagnosis Date  . Anorexia nervosa with bulimia     just finished treatment in August 2012 for this  . Diabetes mellitus     not currently treated for this. Pt states she was told she had DM, but lost @  150 lbs in 9 months.  . Depression   . Anxiety   . Obesity   . Diabetes mellitus without complication [facility])    Past Surgical History  Procedure Laterality Date  . Tonsillectomy    . Wisdom tooth extraction     Family History  Problem Relation Age of Onset  . Diabetes Mother    Social History  Substance Use Topics  . Smoking status: Current Every Day Smoker -- 0.50 packs/day    Types: Cigarettes  . Smokeless tobacco: None  . Alcohol Use: Yes     Comment: occasional drinker - 2 or 3 times a week   OB History    Gravida Para Term Preterm AB TAB SAB Ectopic Multiple Living    0 0 0 0 0 0 0 0       Review of Systems  All other systems reviewed and are negative.     Allergies  Sulfa antibiotics and Sulfa antibiotics  Home Medications   Prior to Admission medications   Medication Sig Start Date End Date Taking? Authorizing Provider  cephALEXin (KEFLEX) 500 MG capsule Take 1 capsule (500 mg total) by mouth 4 (four) times daily. 09/06/12   Hannah Muthersbaugh, PA-C  cephALEXin (KEFLEX) 500 MG capsule Take 1 capsule (500 mg total) by mouth 3 (three) times daily. 12/22/13   Nelva Nay, MD  clonazePAM (KLONOPIN) 0.5 MG tablet Take 0.5 mg by mouth 2 (two) times daily as needed for anxiety.     Historical Provider, MD  cyclobenzaprine (FLEXERIL) 10 MG tablet Take 1 tablet (10 mg total) by mouth 2 (two) times daily as needed for muscle spasms. 09/06/12   Hannah Muthersbaugh, PA-C  etonogestrel (NEXPLANON) 68 MG IMPL implant Inject 1 each into the skin once.    Historical Provider, MD  HYDROcodone-homatropine (HYCODAN) 5-1.5 MG/5ML syrup Take 5 mLs by mouth every 6 (six) hours as needed for cough. 12/22/13   Nelva Nay, MD  ibuprofen (ADVIL,MOTRIN) 800 MG tablet Take 1 tablet (800 mg total) by mouth 3 (three) times daily. 09/06/12   Hannah Muthersbaugh, PA-C  metFORMIN (GLUCOPHAGE) 500 MG tablet Take 1 tablet (500 mg total) by mouth 2 (two) times daily with a  meal. 09/06/12   Hannah Muthersbaugh, PA-C  metFORMIN (GLUCOPHAGE) 500 MG tablet Take 500 mg by mouth 2 (two) times daily with a meal.    Historical Provider, MD  Multiple Vitamin (MULTIVITAMIN WITH MINERALS) TABS Take 1 tablet by mouth every morning.    Historical Provider, MD  naproxen sodium (ANAPROX) 220 MG tablet Take 440 mg by mouth 2 (two) times daily as needed (pain).    Historical Provider, MD   BP 134/91 mmHg  Pulse 105  Temp(Src) 97.7 F (36.5 C) (Oral)  Resp 18  SpO2 99% Physical Exam  Constitutional: She appears well-developed and well-nourished. No distress.  Morbidly obese Caucasian female,  nontoxic  HENT:  Head: Atraumatic.  Eyes: Conjunctivae are normal.  Neck: Neck supple.  Cardiovascular:  Mild tachycardia without murmur rubs or gallops  Pulmonary/Chest: Effort normal and breath sounds normal. No respiratory distress. She has no wheezes.  Abdominal: Soft. Bowel sounds are normal. She exhibits no distension. There is no tenderness. There is no rebound and no guarding.  Negative Murphy sign, no pain at McBurney's point  Genitourinary:  No CVA tenderness. Tenderness to right para spinal muscle on palpation  Neurological: She is alert.  Skin: No rash noted.  Psychiatric: She has a normal mood and affect.  Nursing note and vitals reviewed.   ED Course  Procedures (including critical care time)  Patient with reproducible right low back pain on exam. Does have decrease in appetite with nausea vomiting diarrhea. Suspect viral GI etiology. Workup initiated. Abdomen nontender on exam.  1:11 PM UA shows no evidence of urinary tract infection. Normal renal function. Labs are reassuring. Patient is not pregnant. She is able to ambulate without difficulty. Pain improves. Patient is stable for discharge. Return precaution discussed.  Labs Review Labs Reviewed  URINALYSIS, ROUTINE W REFLEX MICROSCOPIC (NOT AT [facility]) - Abnormal; Notable for the following:    Color, Urine AMBER (*)    APPearance HAZY (*)    Specific Gravity, Urine 1.031 (*)    All other components within normal limits  BASIC METABOLIC PANEL - Abnormal; Notable for the following:    Glucose, Bld 148 (*)    Calcium 8.7 (*)    All other components within normal limits  CBC - Abnormal; Notable for the following:    Hemoglobin 15.3 (*)    All other components within normal limits  I-STAT BETA HCG BLOOD, ED ([facility], [facility], [facility] ONLY)    Imaging Review No results found. I have personally reviewed and evaluated these images and lab results as part of my medical decision-making.   EKG Interpretation None      MDM    Final diagnoses:  Right-sided low back pain without sciatica    BP 134/91 mmHg  Pulse 105  Temp(Src) 97.7 F (36.5 C) (Oral)  Resp 18  SpO2 99%     Fayrene HelperBowie Coral Soler, PA-C 01/09/15 1312  Lorre NickAnthony Allen, MD 01/10/15 [phone]

## 2015-01-09 NOTE — Discharge Instructions (Signed)

## 2015-01-09 NOTE — ED Notes (Signed)
Woke up with excruciating lower right back pain. Hx of kidney problems. States the pain has been coming and going over the last week. Has lost appetite d/t nausea,vomiting, and diarrhea increasing over the last week. Flank and RLQ tenderness present on palpation.

## 2015-09-13 ENCOUNTER — Ambulatory Visit (INDEPENDENT_AMBULATORY_CARE_PROVIDER_SITE_OTHER): Payer: Self-pay | Admitting: Family Medicine

## 2015-09-13 ENCOUNTER — Encounter: Payer: Self-pay | Admitting: Family Medicine

## 2015-09-13 VITALS — BP 119/80 | HR 99 | Ht 67.0 in | Wt 186.7 lb

## 2015-09-13 DIAGNOSIS — L732 Hidradenitis suppurativa: Secondary | ICD-10-CM

## 2015-09-13 DIAGNOSIS — N390 Urinary tract infection, site not specified: Secondary | ICD-10-CM

## 2015-09-13 LAB — POCT URINALYSIS DIP (DEVICE)
BILIRUBIN URINE: NEGATIVE
GLUCOSE, UA: NEGATIVE mg/dL
KETONES UR: NEGATIVE mg/dL
Nitrite: NEGATIVE
PROTEIN: NEGATIVE mg/dL
SPECIFIC GRAVITY, URINE: 1.01 (ref 1.005–1.030)
Urobilinogen, UA: 1 mg/dL (ref 0.0–1.0)
pH: 6 (ref 5.0–8.0)

## 2015-09-13 MED ORDER — FLUCONAZOLE 150 MG PO TABS: ORAL_TABLET | ORAL | 2 refills | Status: DC

## 2015-09-13 MED ORDER — CIPROFLOXACIN HCL 500 MG PO TABS: 500.0000 mg | ORAL_TABLET | Freq: Two times a day (BID) | ORAL | 0 refills | Status: DC

## 2015-09-13 NOTE — Patient Instructions (Addendum)

## 2015-09-13 NOTE — Progress Notes (Signed)
   Subjective:    Patient ID: Crystal Castillo is a 25 y.o. female presenting with Other (Bartholin cyst)  on 09/13/2015  HPI: 3rd recurrence of Bartholin's since February. Seen in urgent care. Has h/o sebaceous cyst. No h/o MRSA. Also, c/o dysuria x 3 days. Has been on Abx and taken Diflucan with no improvement in dysuria. Also, has h/o UTI. Wants to stop Depo due to mood, bleeding profile. Uninterested in child-bearing at this time. Discussed IUD copper vs. Mirena  Review of Systems  Constitutional: Negative for chills and fever.  Respiratory: Negative for shortness of breath.   Cardiovascular: Negative for chest pain.  Gastrointestinal: Negative for abdominal pain, nausea and vomiting.  Genitourinary: Positive for dysuria and vaginal pain.  Skin: Negative for rash.      Objective:    BP 119/80   Pulse 99   Ht 5\' 7"  (1.702 m)   Wt 186 lb 11.2 oz (84.7 kg)   LMP 08/02/2015 Comment: bled for about 3-4 weeks  BMI 29.24 kg/m  Physical Exam  Constitutional: She is oriented to person, place, and time. She appears well-developed and well-nourished.  Pulmonary/Chest: Effort normal.  Genitourinary: Vagina normal.  Genitourinary Comments: Hidradenitis noted no Bartholin's and some excoriation noted  Neurological: She is alert and oriented to person, place, and time.  Skin:  Healed cuts on arms   Urinalysis    Component Value Date/Time   COLORURINE AMBER (A) 01/09/2015 1157   APPEARANCEUR HAZY (A) 01/09/2015 1157   LABSPEC 1.010 09/13/2015 0930   PHURINE 6.0 09/13/2015 0930   GLUCOSEU NEGATIVE 09/13/2015 0930   HGBUR TRACE (A) 09/13/2015 0930   BILIRUBINUR NEGATIVE 09/13/2015 0930   KETONESUR NEGATIVE 09/13/2015 0930   PROTEINUR NEGATIVE 09/13/2015 0930   UROBILINOGEN 1.0 09/13/2015 0930   NITRITE NEGATIVE 09/13/2015 0930   LEUKOCYTESUR LARGE (A) 09/13/2015 0930          Assessment & Plan:   Problem List Items Addressed This Visit      Unprioritized   Hidradenitis      Other Visit Diagnoses    UTI (lower urinary tract infection)    -  Primary   Relevant Medications   ciprofloxacin (CIPRO) 500 MG tablet   fluconazole (DIFLUCAN) 150 MG tablet   Other Relevant Orders   POCT urinalysis dip (device) (Completed)     May use Diflucan prn Total face-to-face time with patient: 30 minutes. Over 50% of encounter was spent on counseling and coordination of care. Follow up as needed To get pap smear with PCP May return for IUD insertion if desires.  PRATT,TANYA S 09/13/2015 9:22 AM

## 2015-09-13 NOTE — Progress Notes (Signed)
Already has a counselor that she sees occasionally, trying to save up money for psychiatrist, medical md managing meds

## 2015-09-13 NOTE — Assessment & Plan Note (Signed)
Antibacterial soap--handout given. Do no shave but trim hair.

## 2015-09-16 ENCOUNTER — Encounter (HOSPITAL_COMMUNITY): Payer: Self-pay | Admitting: Nurse Practitioner

## 2015-09-16 ENCOUNTER — Emergency Department (HOSPITAL_COMMUNITY)
Admission: EM | Admit: 2015-09-16 | Discharge: 2015-09-17 | Disposition: A | Discharge status: Home / Self Care | Payer: BLUE CROSS/BLUE SHIELD | Attending: Emergency Medicine | Admitting: Emergency Medicine

## 2015-09-16 DIAGNOSIS — E119 Type 2 diabetes mellitus without complications: Secondary | ICD-10-CM | POA: Diagnosis not present

## 2015-09-16 DIAGNOSIS — R1084 Generalized abdominal pain: Secondary | ICD-10-CM | POA: Diagnosis present

## 2015-09-16 DIAGNOSIS — K2970 Gastritis, unspecified, without bleeding: Secondary | ICD-10-CM | POA: Diagnosis not present

## 2015-09-16 DIAGNOSIS — Z87891 Personal history of nicotine dependence: Secondary | ICD-10-CM | POA: Insufficient documentation

## 2015-09-16 DIAGNOSIS — R112 Nausea with vomiting, unspecified: Secondary | ICD-10-CM

## 2015-09-16 LAB — URINALYSIS, ROUTINE W REFLEX MICROSCOPIC
GLUCOSE, UA: NEGATIVE mg/dL
HGB URINE DIPSTICK: NEGATIVE
Ketones, ur: >80 mg/dL — AB
Nitrite: NEGATIVE
PH: 5.5 (ref 5.0–8.0)
Protein, ur: 30 mg/dL — AB
SPECIFIC GRAVITY, URINE: 1.034 — AB (ref 1.005–1.030)

## 2015-09-16 LAB — COMPREHENSIVE METABOLIC PANEL
ALBUMIN: 4 g/dL (ref 3.5–5.0)
ALT: 18 U/L (ref 14–54)
ANION GAP: 13 (ref 5–15)
AST: 22 U/L (ref 15–41)
Alkaline Phosphatase: 45 U/L (ref 38–126)
BUN: <5 mg/dL — ABNORMAL LOW (ref 6–20)
CHLORIDE: 106 mmol/L (ref 101–111)
CO2: 20 mmol/L — AB (ref 22–32)
Calcium: 9.6 mg/dL (ref 8.9–10.3)
Creatinine, Ser: 0.69 mg/dL (ref 0.44–1.00)
GFR calc non Af Amer: >60 mL/min (ref >60)
Glucose, Bld: 81 mg/dL (ref 65–99)
Potassium: 3.5 mmol/L (ref 3.5–5.1)
SODIUM: 139 mmol/L (ref 135–145)
Total Bilirubin: 1.1 mg/dL (ref 0.3–1.2)
Total Protein: 6.9 g/dL (ref 6.5–8.1)

## 2015-09-16 LAB — CBC
HEMATOCRIT: 49.7 % — AB (ref 36.0–46.0)
HEMOGLOBIN: 17 g/dL — AB (ref 12.0–15.0)
MCH: 33.4 pg (ref 26.0–34.0)
MCHC: 34.2 g/dL (ref 30.0–36.0)
MCV: 97.6 fL (ref 78.0–100.0)
Platelets: 282 10*3/uL (ref 150–400)
RBC: 5.09 MIL/uL (ref 3.87–5.11)
RDW: 13.3 % (ref 11.5–15.5)
WBC: 10.9 10*3/uL — ABNORMAL HIGH (ref 4.0–10.5)

## 2015-09-16 LAB — I-STAT BETA HCG BLOOD, ED (MC, WL, AP ONLY)

## 2015-09-16 LAB — URINE MICROSCOPIC-ADD ON

## 2015-09-16 MED ORDER — SODIUM CHLORIDE 0.9 % IV BOLUS (SEPSIS)
1000.0000 mL | Freq: Once | INTRAVENOUS | Status: AC
  Administered 2015-09-16: 1000 mL via INTRAVENOUS

## 2015-09-16 MED ORDER — GI COCKTAIL ~~LOC~~
30.0000 mL | Freq: Once | ORAL | Status: AC
  Administered 2015-09-17: 30 mL via ORAL
  Filled 2015-09-16: qty 30

## 2015-09-16 MED ORDER — LORAZEPAM 2 MG/ML IJ SOLN
1.0000 mg | Freq: Once | INTRAMUSCULAR | Status: AC
  Administered 2015-09-17: 1 mg via INTRAVENOUS
  Filled 2015-09-16: qty 1

## 2015-09-16 MED ORDER — PROMETHAZINE HCL 25 MG/ML IJ SOLN
12.5000 mg | Freq: Once | INTRAMUSCULAR | Status: AC
  Administered 2015-09-16: 12.5 mg via INTRAVENOUS
  Filled 2015-09-16 (×2): qty 1

## 2015-09-16 NOTE — ED Triage Notes (Addendum)
She c/o waking this morning with abd pain. She reports n/v, sweats. She denies bowel, bladder changes. She reports poor oral intake this week. She tried zofran today with no relief. She is currently on abx for sebaceous cyst and UTI.She is alert and breathing easily

## 2015-09-16 NOTE — ED Provider Notes (Signed)
[facility]-EMERGENCY DEPT Provider Note   CSN: [id number] Arrival date & time: 09/16/15  1538  First Provider Contact:  First MD Initiated Contact with Patient 09/16/15 2138      History   Chief Complaint Chief Complaint  Patient presents with  . Abdominal Pain    HPI Crystal Castillo is a 25 y.o. female.  The history is provided by the patient. No language interpreter was used.  Abdominal Pain   This is a recurrent problem. The current episode started yesterday. The problem occurs rarely. The problem has not changed since onset.Associated with: taking medications on empty stomach. The pain is located in the generalized abdominal region. Associated symptoms include diarrhea, nausea and vomiting. Pertinent negatives include fever, melena, constipation, dysuria, frequency, hematuria, headaches, arthralgias and myalgias. Nothing aggravates the symptoms. Nothing relieves the symptoms. Past workup includes ultrasound.  Emesis   Associated symptoms include abdominal pain and diarrhea. Pertinent negatives include no arthralgias, no chills, no cough, no fever, no headaches and no myalgias.    Past Medical History:  Diagnosis Date  . Anorexia nervosa with bulimia    just finished treatment in August 2012 for this  . Anxiety   . Bartholin's cyst   . Depression   . Diabetes mellitus    not currently treated for this. Pt states she was told she had DM, but lost @  150 lbs in 9 months.  . Diabetes mellitus without complication ([name])   . Obesity   . PTSD (post-traumatic stress disorder)     Patient Active Problem List   Diagnosis Date Noted  . Hidradenitis 09/13/2015    Past Surgical History:  Procedure Laterality Date  . TONSILLECTOMY    . WISDOM TOOTH EXTRACTION      OB History    Gravida Para Term Preterm AB Living   0 0 0 0 0     SAB TAB Ectopic Multiple Live Births   0 0 0           Home Medications    Prior to Admission medications   Medication Sig Start Date End Date  Taking? Authorizing Provider  buPROPion (WELLBUTRIN SR) 150 MG 12 hr tablet Take 150 mg by mouth 2 (two) times daily.  07/18/15 07/17/16 Yes Historical Provider, MD  ciprofloxacin (CIPRO) 500 MG tablet Take 1 tablet (500 mg total) by mouth 2 (two) times daily. 09/13/15  Yes Reva Bores, MD  clonazePAM (KLONOPIN) 1 MG tablet TAKE ONE TABLET BY MOUTH THREE TIMES A DAY AS NEEDED FOR ANXIETY 08/15/15  Yes Historical Provider, MD  doxycycline (MONODOX) 100 MG capsule Take 100 mg by mouth 2 (two) times daily.  09/09/15 09/19/15 Yes Historical Provider, MD  fluconazole (DIFLUCAN) 150 MG tablet Take one tablet every third day if yeast infection develops. 09/13/15  Yes Reva Bores, MD  topiramate (TOPAMAX) 25 MG tablet TAKE TWO TABLETS (50 MG TOTAL) BY MOUTH 2 (TWO) TIMES DAILY. 07/18/15  Yes Historical Provider, MD  promethazine (PHENERGAN) 25 MG tablet Take 1 tablet (25 mg total) by mouth every 6 (six) hours as needed for nausea or vomiting. 09/17/15   Dan Humphreys, MD    Family History Family History  Problem Relation Age of Onset  . Diabetes Mother     Social History Social History  Substance Use Topics  . Smoking status: Former Smoker    Packs/day: 0.50    Types: Cigarettes  . Smokeless tobacco: Never Used     Comment: rarely smokes  . Alcohol  use Yes     Comment: occasional drinker - 2 or 3 times a week     Allergies   Quetiapine; Sulfa antibiotics; and Sulfa antibiotics   Review of Systems Review of Systems  Constitutional: Negative for chills and fever.  HENT: Negative for ear pain and sore throat.   Eyes: Negative for pain and visual disturbance.  Respiratory: Negative for cough and shortness of breath.   Cardiovascular: Negative for chest pain and palpitations.  Gastrointestinal: Positive for abdominal pain, diarrhea, nausea and vomiting. Negative for constipation and melena.  Genitourinary: Negative for dysuria, frequency and hematuria.  Musculoskeletal: Negative for arthralgias,  back pain and myalgias.  Skin: Negative for color change and rash.  Neurological: Negative for seizures, syncope and headaches.  All other systems reviewed and are negative.    Physical Exam Updated Vital Signs BP 111/87   Pulse 99   Temp 98 F (36.7 C) (Oral)   Resp 20   LMP 08/02/2015 Comment: bled for about 3-4 weeks  SpO2 99%   Physical Exam  Constitutional: She appears well-developed and well-nourished. No distress.  HENT:  Head: Normocephalic and atraumatic.  Eyes: Conjunctivae are normal.  Neck: Neck supple.  Cardiovascular: Normal rate and regular rhythm.   No murmur heard. Pulmonary/Chest: Effort normal and breath sounds normal. No respiratory distress.  Abdominal: Soft. She exhibits no distension and no mass. There is no rebound and no guarding. No hernia.  Musculoskeletal: She exhibits no edema.  Neurological: She is alert.  Skin: Skin is warm and dry. Capillary refill takes less than 2 seconds.  Psychiatric: She has a normal mood and affect.  Nursing note and vitals reviewed.    ED Treatments / Results  Labs (all labs ordered are listed, but only abnormal results are displayed) Labs Reviewed  COMPREHENSIVE METABOLIC PANEL - Abnormal; Notable for the following:       Result Value   CO2 20 (*)    BUN <5 (*)    All other components within normal limits  CBC - Abnormal; Notable for the following:    WBC 10.9 (*)    Hemoglobin 17.0 (*)    HCT 49.7 (*)    All other components within normal limits  URINALYSIS, ROUTINE W REFLEX MICROSCOPIC (NOT AT [facility]) - Abnormal; Notable for the following:    Color, Urine AMBER (*)    APPearance CLOUDY (*)    Specific Gravity, Urine 1.034 (*)    Bilirubin Urine MODERATE (*)    Ketones, ur >80 (*)    Protein, ur 30 (*)    Leukocytes, UA MODERATE (*)    All other components within normal limits  URINE MICROSCOPIC-ADD ON - Abnormal; Notable for the following:    Squamous Epithelial / LPF 6-30 (*)    Bacteria, UA FEW (*)     All other components within normal limits  I-STAT BETA HCG BLOOD, ED ([facility], [facility], [facility] ONLY)    EKG  EKG Interpretation None       Radiology No results found.  Procedures Procedures (including critical care time)  Medications Ordered in ED Medications  sodium chloride 0.9 % bolus 1,000 mL (1,000 mLs Intravenous New Bag/Given 09/16/15 2319)  promethazine (PHENERGAN) injection 12.5 mg (12.5 mg Intravenous Given 09/16/15 2320)  LORazepam (ATIVAN) injection 1 mg (1 mg Intravenous Given 09/17/15 0012)  gi cocktail (Maalox,Lidocaine,Donnatal) (30 mLs Oral Given 09/17/15 0014)     Initial Impression / Assessment and Plan / ED Course  I have reviewed the triage vital  signs and the nursing notes.  Pertinent labs & imaging results that were available during my care of the patient were reviewed by me and considered in my medical decision making (see chart for details).  Clinical Course    Patient is a 25 year old female with history of anorexia, recent UTI (on ciprofloxacin and doxycycline (who presents emergency department for evaluation of nausea, vomiting, diarrhea, diffuse abdominal pain. She has been unwilling to eat over the past 4-5 days due to her anorexia. She continues take her antibiotics.  Vital signs unremarkable. Patient is overall well appearing no acute distress. She is nontender nondistended abdominal exam.  Concern that nausea, vomiting, abdominal pain due to gastritis from taking antibiotics and tramadol on an empty stomach. Do not suspect cholecystitis as patient is nontender in the right upper quadrant. No right lower quadrant tenderness to suggest appendicitis. No pelvic pain or symptoms to suggest PID. Potentially gastroenteritis as well.  Ordered IV hydration as patient's specific gravity is 1.034. Do not suspect ongoing urinary tract infection. White blood cell count mildly elevated and hemoglobin mildly elevated, consistent with contraction of intravascular volume.  I  ordered patient Phenergan for nausea, normal saline bolus, Ativan she had not taken her home Klonopin for several days, GI cocktail. Do not feel that imaging is clinically indicated at this time. Patient with no pelvic pain, discharge to warrant pelvic exam.   Patient reevaluated. Feels improved, wishes to try to drink water.  GI cocktail given, ativan given, will allow fluids to finish.  Patient able to tolerate PO intake.    12:33 AM: Patient reevaluated. Able to drink water, wishes to be discharged home. Patient has a follow-up appointment with her PCP on Wednesday morning exam.  Will discharge with Phenergan, instructions to take previous antibiotics on full stomach.  Discussed with my attending, Dr. Jeraldine Loots.    Final Clinical Impressions(s) / ED Diagnoses   Final diagnoses:  Non-intractable vomiting with nausea, vomiting of unspecified type  Gastritis    New Prescriptions New Prescriptions   PROMETHAZINE (PHENERGAN) 25 MG TABLET    Take 1 tablet (25 mg total) by mouth every 6 (six) hours as needed for nausea or vomiting.     Dan Humphreys, MD 09/17/15 [id number]    Gerhard Munch, MD 09/17/15 [phone]

## 2015-09-17 MED ORDER — PROMETHAZINE HCL 25 MG PO TABS: 25.0000 mg | ORAL_TABLET | Freq: Four times a day (QID) | ORAL | 0 refills | Status: DC | PRN

## 2015-10-11 ENCOUNTER — Encounter: Payer: Self-pay | Admitting: *Deleted

## 2016-05-09 DIAGNOSIS — F1910 Other psychoactive substance abuse, uncomplicated: Secondary | ICD-10-CM | POA: Insufficient documentation

## 2016-07-21 ENCOUNTER — Encounter (HOSPITAL_COMMUNITY): Payer: Self-pay | Admitting: Emergency Medicine

## 2016-07-21 ENCOUNTER — Emergency Department (HOSPITAL_COMMUNITY)
Admission: EM | Admit: 2016-07-21 | Discharge: 2016-07-22 | Disposition: A | Discharge status: Home / Self Care | Payer: BLUE CROSS/BLUE SHIELD | Attending: Emergency Medicine | Admitting: Emergency Medicine

## 2016-07-21 DIAGNOSIS — Z79899 Other long term (current) drug therapy: Secondary | ICD-10-CM | POA: Diagnosis not present

## 2016-07-21 DIAGNOSIS — E119 Type 2 diabetes mellitus without complications: Secondary | ICD-10-CM | POA: Insufficient documentation

## 2016-07-21 DIAGNOSIS — Z87891 Personal history of nicotine dependence: Secondary | ICD-10-CM | POA: Insufficient documentation

## 2016-07-21 DIAGNOSIS — T40601A Poisoning by unspecified narcotics, accidental (unintentional), initial encounter: Secondary | ICD-10-CM

## 2016-07-21 DIAGNOSIS — F1910 Other psychoactive substance abuse, uncomplicated: Secondary | ICD-10-CM

## 2016-07-21 DIAGNOSIS — T401X1A Poisoning by heroin, accidental (unintentional), initial encounter: Secondary | ICD-10-CM | POA: Diagnosis not present

## 2016-07-21 NOTE — ED Provider Notes (Signed)
[facility]-EMERGENCY DEPT Provider Note   CSN: [id number] Arrival date & time: 07/21/16  2146     History   Chief Complaint Chief Complaint  Patient presents with  . Drug Overdose    HPI Crystal Castillo is a 26 y.o. female.  26 year old female presents after ingesting heroin and becoming unresponsive. States that this was not a suicide attempt. She also admits to using alcohol and taking Klonopin. States that she was trying to become intoxicated. Patient found in the parking lot of a pharmacy she is breathing 6 times a minute. She was given 2 mg of Narcan In and became unresponsive. She was also given a milligram of Narcan IV He is brought here by EMS for further management      Past Medical History:  Diagnosis Date  . Anorexia nervosa with bulimia    just finished treatment in August 2012 for this  . Anxiety   . Bartholin's cyst   . Depression   . Diabetes mellitus    not currently treated for this. Pt states she was told she had DM, but lost @  150 lbs in 9 months.  . Diabetes mellitus without complication ([name])   . Obesity   . PTSD (post-traumatic stress disorder)     Patient Active Problem List   Diagnosis Date Noted  . Hidradenitis 09/13/2015    Past Surgical History:  Procedure Laterality Date  . TONSILLECTOMY    . WISDOM TOOTH EXTRACTION      OB History    Gravida Para Term Preterm AB Living   0 0 0 0 0     SAB TAB Ectopic Multiple Live Births   0 0 0           Home Medications    Prior to Admission medications   Medication Sig Start Date End Date Taking? Authorizing Provider  buPROPion (WELLBUTRIN SR) 150 MG 12 hr tablet Take 150 mg by mouth 2 (two) times daily.  07/18/15 07/21/16 Yes [provider]  clonazePAM (KLONOPIN) 1 MG tablet TAKE ONE TABLET BY MOUTH THREE TIMES A DAY AS NEEDED FOR ANXIETY 08/15/15  Yes [provider]  topiramate (TOPAMAX) 25 MG tablet TAKE TWO TABLETS (50 MG TOTAL) BY MOUTH 2 (TWO) TIMES DAILY. 07/18/15  Yes  [provider]  ciprofloxacin (CIPRO) 500 MG tablet Take 1 tablet (500 mg total) by mouth 2 (two) times daily. Patient not taking: Reported on 07/21/2016 09/13/15   Reva BoresPratt, Tanya S, MD  fluconazole (DIFLUCAN) 150 MG tablet Take one tablet every third day if yeast infection develops. Patient not taking: Reported on 07/21/2016 09/13/15   Reva BoresPratt, Tanya S, MD  promethazine (PHENERGAN) 25 MG tablet Take 1 tablet (25 mg total) by mouth every 6 (six) hours as needed for nausea or vomiting. Patient not taking: Reported on 07/21/2016 09/17/15   Dan HumphreysIrick, Michael, MD    Family History Family History  Problem Relation Age of Onset  . Diabetes Mother     Social History Social History  Substance Use Topics  . Smoking status: Former Smoker    Packs/day: 0.50    Types: Cigarettes  . Smokeless tobacco: Never Used     Comment: rarely smokes  . Alcohol use Yes     Comment: occasional drinker - 2 or 3 times a week     Allergies   Quetiapine; Sulfa antibiotics; and Sulfa antibiotics   Review of Systems Review of Systems  All other systems reviewed and are negative.    Physical  Exam Updated Vital Signs BP 126/64 (BP Location: Left Arm)   Pulse 95   Temp 97.8 F (36.6 C) (Oral)   Resp 18   SpO2 100%   Physical Exam  Constitutional: She is oriented to person, place, and time. She appears well-developed and well-nourished.  Non-toxic appearance. No distress.  HENT:  Head: Normocephalic and atraumatic.  Eyes: Conjunctivae, EOM and lids are normal. Pupils are equal, round, and reactive to light.  Neck: Normal range of motion. Neck supple. No tracheal deviation present. No thyroid mass present.  Cardiovascular: Normal rate, regular rhythm and normal heart sounds.  Exam reveals no gallop.   No murmur heard. Pulmonary/Chest: Effort normal and breath sounds normal. No stridor. No respiratory distress. She has no decreased breath sounds. She has no wheezes. She has no rhonchi. She has no rales.    Abdominal: Soft. Normal appearance and bowel sounds are normal. She exhibits no distension. There is no tenderness. There is no rebound and no CVA tenderness.  Musculoskeletal: Normal range of motion. She exhibits no edema or tenderness.  Neurological: She is alert and oriented to person, place, and time. She has normal strength. No cranial nerve deficit or sensory deficit. GCS eye subscore is 4. GCS verbal subscore is 5. GCS motor subscore is 6.  Skin: Skin is warm and dry. No abrasion and no rash noted.  Psychiatric: She has a normal mood and affect. Her speech is normal. She is withdrawn. She expresses no suicidal plans and no homicidal plans.  Nursing note and vitals reviewed.    ED Treatments / Results  Labs (all labs ordered are listed, but only abnormal results are displayed) Labs Reviewed - No data to display  EKG  EKG Interpretation None       Radiology No results found.  Procedures Procedures (including critical care time)  Medications Ordered in ED Medications - No data to display   Initial Impression / Assessment and Plan / ED Course  I have reviewed the triage vital signs and the nursing notes.  Pertinent labs & imaging results that were available during my care of the patient were reviewed by me and considered in my medical decision making (see chart for details).     Patient with polysubstance abuse with alcohol, heroin, Klonopin. Will be monitored here until clinically sober. Care signed out to next provider    Final Clinical Impressions(s) / ED Diagnoses   Final diagnoses:  None    New Prescriptions New Prescriptions   No medications on file     Lorre Nick, MD 07/21/16 2337

## 2016-07-21 NOTE — ED Triage Notes (Signed)
Patient brought in by EMS after overdosing on heroine in the [facility] parking lot. Patient was breathing 6 times a minute when Fire Dept arrived, pt was bagged and given 2mg  narcan via IN. Respirations still shallow 6-10, given 1mg  additional narcan IV by EMS. Pts LOC improved but remains confused. Last VS: BP 146/102, HR 112, 98% RA, R 20, CBG 137. Patiens boyfriend reported pt is prescribed Klonopin and was not sure if she had taken them.

## 2016-07-21 NOTE — ED Notes (Signed)
Bed: [id number] Expected date:  Expected time:  Means of arrival:  Comments: EMS 26 yo female overdose heroin/Narcan 2 mg IN and 1 mg IV

## 2016-07-22 NOTE — ED Notes (Signed)
Patient ambulated well in hallway with standby assist.

## 2016-07-22 NOTE — ED Provider Notes (Signed)
12:50 AM Patient appears tired, but speech is clear. She follows commands. Conversation is thoughtful and patient expresses regret over decision to relapse from 3 month sobriety. She reports recent passing of her father leading to her substance use today.  Patient able to ambulate in the ED with minimal assistance. Friend, at bedside, states that she will stay with the patient at her home tonight. Given that patient is now ~4 hours out since receiving Narcan with stable VS, I believe further outpatient sobering is appropriate. Return precautions discussed and provided. Patient discharged in stable condition with no unaddressed concerns.   Vitals:   07/21/16 2204 07/21/16 2230 07/21/16 2330 07/22/16 0030  BP: 126/64 123/81 113/76 108/63  Pulse: 95 89 90 88  Resp: 18 15 17 20   Temp: 97.8 F (36.6 C)     TempSrc: Oral     SpO2: 100% 95% 94% 93%      Antony MaduraHumes, Kenijah Benningfield, PA-C 07/22/16 0050    Lorre NickAllen, Anthony, MD 07/24/16 [phone]

## 2016-08-16 ENCOUNTER — Inpatient Hospital Stay (HOSPITAL_COMMUNITY)
Admission: EM | Admit: 2016-08-16 | Discharge: 2016-08-18 | DRG: 918 | Payer: BLUE CROSS/BLUE SHIELD | Attending: Internal Medicine | Admitting: Internal Medicine

## 2016-08-16 ENCOUNTER — Encounter (HOSPITAL_COMMUNITY): Payer: Self-pay | Admitting: Oncology

## 2016-08-16 DIAGNOSIS — F418 Other specified anxiety disorders: Secondary | ICD-10-CM | POA: Diagnosis not present

## 2016-08-16 DIAGNOSIS — Y906 Blood alcohol level of 120-199 mg/100 ml: Secondary | ICD-10-CM | POA: Diagnosis present

## 2016-08-16 DIAGNOSIS — E119 Type 2 diabetes mellitus without complications: Secondary | ICD-10-CM | POA: Diagnosis not present

## 2016-08-16 DIAGNOSIS — Z7984 Long term (current) use of oral hypoglycemic drugs: Secondary | ICD-10-CM

## 2016-08-16 DIAGNOSIS — Z888 Allergy status to other drugs, medicaments and biological substances status: Secondary | ICD-10-CM

## 2016-08-16 DIAGNOSIS — F10129 Alcohol abuse with intoxication, unspecified: Secondary | ICD-10-CM | POA: Diagnosis present

## 2016-08-16 DIAGNOSIS — T50902A Poisoning by unspecified drugs, medicaments and biological substances, intentional self-harm, initial encounter: Secondary | ICD-10-CM

## 2016-08-16 DIAGNOSIS — T43222A Poisoning by selective serotonin reuptake inhibitors, intentional self-harm, initial encounter: Secondary | ICD-10-CM | POA: Diagnosis not present

## 2016-08-16 DIAGNOSIS — F10929 Alcohol use, unspecified with intoxication, unspecified: Secondary | ICD-10-CM | POA: Diagnosis not present

## 2016-08-16 DIAGNOSIS — R000 Tachycardia, unspecified: Secondary | ICD-10-CM

## 2016-08-16 DIAGNOSIS — A599 Trichomoniasis, unspecified: Secondary | ICD-10-CM | POA: Diagnosis not present

## 2016-08-16 DIAGNOSIS — Z87891 Personal history of nicotine dependence: Secondary | ICD-10-CM

## 2016-08-16 DIAGNOSIS — F4310 Post-traumatic stress disorder, unspecified: Secondary | ICD-10-CM | POA: Diagnosis present

## 2016-08-16 DIAGNOSIS — Z915 Personal history of self-harm: Secondary | ICD-10-CM

## 2016-08-16 DIAGNOSIS — Z882 Allergy status to sulfonamides status: Secondary | ICD-10-CM

## 2016-08-16 DIAGNOSIS — R441 Visual hallucinations: Secondary | ICD-10-CM | POA: Diagnosis present

## 2016-08-16 DIAGNOSIS — F1910 Other psychoactive substance abuse, uncomplicated: Secondary | ICD-10-CM

## 2016-08-16 DIAGNOSIS — E876 Hypokalemia: Secondary | ICD-10-CM | POA: Diagnosis present

## 2016-08-16 LAB — COMPREHENSIVE METABOLIC PANEL
ALBUMIN: 4.4 g/dL (ref 3.5–5.0)
ALT: 41 U/L (ref 14–54)
ANION GAP: 13 (ref 5–15)
AST: 27 U/L (ref 15–41)
Alkaline Phosphatase: 47 U/L (ref 38–126)
BUN: 9 mg/dL (ref 6–20)
CHLORIDE: 113 mmol/L — AB (ref 101–111)
CO2: 18 mmol/L — AB (ref 22–32)
Calcium: 9.2 mg/dL (ref 8.9–10.3)
Creatinine, Ser: 0.68 mg/dL (ref 0.44–1.00)
GFR calc Af Amer: >60 mL/min (ref >60)
GFR calc non Af Amer: >60 mL/min (ref >60)
GLUCOSE: 154 mg/dL — AB (ref 65–99)
POTASSIUM: 3.3 mmol/L — AB (ref 3.5–5.1)
SODIUM: 144 mmol/L (ref 135–145)
Total Bilirubin: 0.4 mg/dL (ref 0.3–1.2)
Total Protein: 8 g/dL (ref 6.5–8.1)

## 2016-08-16 LAB — CBC
HEMATOCRIT: 45 % (ref 36.0–46.0)
Hemoglobin: 16 g/dL — ABNORMAL HIGH (ref 12.0–15.0)
MCH: 34.6 pg — ABNORMAL HIGH (ref 26.0–34.0)
MCHC: 35.6 g/dL (ref 30.0–36.0)
MCV: 97.4 fL (ref 78.0–100.0)
Platelets: 252 10*3/uL (ref 150–400)
RBC: 4.62 MIL/uL (ref 3.87–5.11)
RDW: 12.4 % (ref 11.5–15.5)
WBC: 7.9 10*3/uL (ref 4.0–10.5)

## 2016-08-16 LAB — ETHANOL: Alcohol, Ethyl (B): 168 mg/dL — ABNORMAL HIGH (ref <5)

## 2016-08-16 LAB — ACETAMINOPHEN LEVEL

## 2016-08-16 LAB — SALICYLATE LEVEL: Salicylate Lvl: <7 mg/dL (ref 2.8–30.0)

## 2016-08-16 LAB — CK: Total CK: 84 U/L (ref 38–234)

## 2016-08-16 MED ORDER — SODIUM CHLORIDE 0.9 % IV BOLUS (SEPSIS)
2000.0000 mL | Freq: Once | INTRAVENOUS | Status: AC
  Administered 2016-08-16: 2000 mL via INTRAVENOUS

## 2016-08-16 MED ORDER — ENOXAPARIN SODIUM 40 MG/0.4ML ~~LOC~~ SOLN
40.0000 mg | SUBCUTANEOUS | Status: DC
  Administered 2016-08-17 – 2016-08-18 (×2): 40 mg via SUBCUTANEOUS
  Filled 2016-08-16 (×2): qty 0.4

## 2016-08-16 MED ORDER — LORAZEPAM 2 MG/ML IJ SOLN
1.0000 mg | Freq: Once | INTRAMUSCULAR | Status: AC
  Administered 2016-08-16: 1 mg via INTRAVENOUS
  Filled 2016-08-16: qty 1

## 2016-08-16 MED ORDER — MAGNESIUM SULFATE 50 % IJ SOLN: 1.0000 g | Freq: Once | INTRAMUSCULAR | Status: DC

## 2016-08-16 MED ORDER — SODIUM CHLORIDE 0.9% FLUSH
3.0000 mL | Freq: Two times a day (BID) | INTRAVENOUS | Status: DC
  Administered 2016-08-17 – 2016-08-18 (×2): 3 mL via INTRAVENOUS

## 2016-08-16 MED ORDER — INSULIN ASPART 100 UNIT/ML ~~LOC~~ SOLN
0.0000 [IU] | SUBCUTANEOUS | Status: DC
  Administered 2016-08-17 (×2): 1 [IU] via SUBCUTANEOUS

## 2016-08-16 MED ORDER — LORAZEPAM 2 MG/ML IJ SOLN
2.0000 mg | INTRAMUSCULAR | Status: DC | PRN
  Administered 2016-08-17 – 2016-08-18 (×10): 2 mg via INTRAVENOUS
  Filled 2016-08-16 (×11): qty 1

## 2016-08-16 MED ORDER — POTASSIUM CHLORIDE IN NACL 20-0.9 MEQ/L-% IV SOLN
INTRAVENOUS | Status: DC
  Administered 2016-08-17: 01:00:00 via INTRAVENOUS
  Filled 2016-08-16 (×2): qty 1000

## 2016-08-16 MED ORDER — MAGNESIUM SULFATE IN D5W 1-5 GM/100ML-% IV SOLN
1.0000 g | Freq: Once | INTRAVENOUS | Status: AC
  Administered 2016-08-17: 1 g via INTRAVENOUS
  Filled 2016-08-16 (×2): qty 100

## 2016-08-16 NOTE — H&P (Signed)
History and Physical    Crystal Castillo [place] ZOX:[id number] DOB: 11/04/1990 DOA: 08/16/2016  PCP: Eartha InchBadger, Michael C, MD   Patient coming from: Home  Chief Complaint: Intentional overdose (Wellbutrin)   HPI: Crystal Castillo [place] is a 26 y.o. female with medical history significant for depression, anxiety, polysubstance abuse, type 2 diabetes mellitus, and recent opiate overdose, now presenting to the emergency department after an intentional overdose on her prescribed Wellbutrin. Patient reports that she was in her usual state of health, but after an argument with her boyfriend, took close to, but not quite 60 tabs of Wellbutrin 150 mg 12 Hr. She had also been drinking alcohol. She adamantly denies attempting to injure herself, but is not able to describe what her intentions were. She reports being in her usual state upon waking this morning, denies recent fevers or chills, denies chest pain or palpitations, and denies dyspnea or cough. Patient also denies headache, change in vision or hearing, or loss of coordination. She does endorse active visual hallucinations, but denies auditory hallucinations. She denies suicidal or homicidal ideation. The ingestion occurred at approximately 7 PM on 08/16/2016.  ED Course: Upon arrival to the ED, patient is found to be afebrile, saturating well on room air, mildly tachypneic, tachycardic in the 140s, and with stable blood pressure. EKG features a sinus tachycardia with rate 155. Chemstrip panels notable for potassium of 3.3 and bicarbonate of 18. Serum glucose is 154. CBC is notable for a hemoglobin of 16.0 and is otherwise normal. Ethanol level is elevated 268, acetaminophen level was undetectable, and salicylates are undetectable. Urinalysis notable for ketonuria, elevated specific gravity, and Trichomonas. UDS remains pending. Patient was treated with 2 Castillo normal saline and 1 mg IV Ativan. Poison control was consulted and advised in-hospital observation for at least 24 hours with  serial EKG every 4 hours, monitoring for possible widening of the QRS or QTc prolongation. Poison control advises treatment with Ativan as needed and bicarbonate if QRS widening develops. Patient will be observed in the stepdown unit for ongoing evaluation and management of intentional Wellbutrin overdose with tachycardia and confusion.  Review of Systems:  All other systems reviewed and apart from HPI, are negative.  Past Medical History:  Diagnosis Date  . Anorexia nervosa with bulimia    just finished treatment in August 2012 for this  . Anxiety   . Bartholin's cyst   . Depression   . Diabetes mellitus    not currently treated for this. Pt states she was told she had DM, but lost @  150 lbs in 9 months.  . Diabetes mellitus without complication ([name])   . Obesity   . PTSD (post-traumatic stress disorder)     Past Surgical History:  Procedure Laterality Date  . TONSILLECTOMY    . WISDOM TOOTH EXTRACTION       reports that she has quit smoking. Her smoking use included Cigarettes. She smoked 0.50 packs per day. She has never used smokeless tobacco. She reports that she drinks alcohol. She reports that she does not use drugs.  Allergies  Allergen Reactions  . Quetiapine Itching  . Sulfa Antibiotics Nausea Only  . Sulfa Antibiotics Nausea Only    Family History  Problem Relation Age of Onset  . Diabetes Mother      Prior to Admission medications   Medication Sig Start Date End Date Taking? Authorizing Provider  buPROPion (WELLBUTRIN SR) 150 MG 12 hr tablet Take 150 mg by mouth 2 (two) times daily.  07/18/15  08/16/16 Yes [provider]  clonazePAM (KLONOPIN) 1 MG tablet TAKE ONE TABLET BY MOUTH THREE TIMES A DAY AS NEEDED FOR ANXIETY 08/15/15  Yes [provider]  topiramate (TOPAMAX) 25 MG tablet TAKE TWO TABLETS (50 MG TOTAL) BY MOUTH 2 (TWO) TIMES DAILY. 07/18/15  Yes [provider]    Physical Exam: Vitals:   08/16/16 2245 08/16/16 2300 08/16/16  2315 08/16/16 2330  BP: 127/73 112/70 123/76 95/66  Pulse: (!) 134 (!) 147 (!) 149 (!) 137  Resp: 20 (!) 28 (!) 27 (!) 22  SpO2: 94% 96% 97% 96%      Constitutional: NAD, calm, comfortable Eyes: PERTLA, lids and conjunctivae normal ENMT: Mucous membranes are moist. Posterior pharynx clear of any exudate or lesions.   Neck: normal, supple, no masses, no thyromegaly Respiratory: clear to auscultation bilaterally, no wheezing, no crackles. Normal respiratory effort.    Cardiovascular: Rate ~140 and regular. No extremity edema. No significant JVD. Abdomen: No distension, no tenderness, no masses palpated. Bowel sounds normal.  Musculoskeletal: no clubbing / cyanosis. No joint deformity upper and lower extremities.   Skin: no significant rashes, lesions, ulcers. Sunburned, poor turgor. Neurologic: CN 2-12 grossly intact. Sensation intact, DTR normal. Strength 5/5 in all 4 limbs.  Psychiatric: Alert and oriented x 3. Actively hallucinating, distracted, redirectable.     Labs on Admission: I have personally reviewed following labs and imaging studies  CBC:  Recent Labs Lab 08/16/16 2140  WBC 7.9  HGB 16.0*  HCT 45.0  MCV 97.4  PLT 252   Basic Metabolic Panel:  Recent Labs Lab 08/16/16 2140  NA 144  K 3.3*  CL 113*  CO2 18*  GLUCOSE 154*  BUN 9  CREATININE 0.68  CALCIUM 9.2   GFR: CrCl cannot be calculated (Unknown ideal weight.). Liver Function Tests:  Recent Labs Lab 08/16/16 2140  AST 27  ALT 41  ALKPHOS 47  BILITOT 0.4  PROT 8.0  ALBUMIN 4.4   No results for input(s): LIPASE, AMYLASE in the last 168 hours. No results for input(s): AMMONIA in the last 168 hours. Coagulation Profile: No results for input(s): INR, PROTIME in the last 168 hours. Cardiac Enzymes:  Recent Labs Lab 08/16/16 2216  CKTOTAL 84   BNP (last 3 results) No results for input(s): PROBNP in the last 8760 hours. HbA1C: No results for input(s): HGBA1C in the last 72  hours. CBG: No results for input(s): GLUCAP in the last 168 hours. Lipid Profile: No results for input(s): CHOL, HDL, LDLCALC, TRIG, CHOLHDL, LDLDIRECT in the last 72 hours. Thyroid Function Tests: No results for input(s): TSH, T4TOTAL, FREET4, T3FREE, THYROIDAB in the last 72 hours. Anemia Panel: No results for input(s): VITAMINB12, FOLATE, FERRITIN, TIBC, IRON, RETICCTPCT in the last 72 hours. Urine analysis:    Component Value Date/Time   COLORURINE AMBER (A) 09/16/2015 2141   APPEARANCEUR CLOUDY (A) 09/16/2015 2141   LABSPEC 1.034 (H) 09/16/2015 2141   PHURINE 5.5 09/16/2015 2141   GLUCOSEU NEGATIVE 09/16/2015 2141   HGBUR NEGATIVE 09/16/2015 2141   BILIRUBINUR MODERATE (A) 09/16/2015 2141   KETONESUR >80 (A) 09/16/2015 2141   PROTEINUR 30 (A) 09/16/2015 2141   UROBILINOGEN 1.0 09/13/2015 0930   NITRITE NEGATIVE 09/16/2015 2141   LEUKOCYTESUR MODERATE (A) 09/16/2015 2141   Sepsis Labs: @LABRCNTIP (procalcitonin:4,lacticidven:4) )No results found for this or any previous visit (from the past 240 hour(s)).   Radiological Exams on Admission: No results found.  EKG: Independently reviewed. Sinus tachycardia (rate 155).   Assessment/Plan  1. Intentional Wellbutrin overdose  - Pt reports ingesting close to 60 tabs of Wellbutrin 150 mg 12 Hr at ~7 pm on 08/16/16  - This was done following an argument with her significant other, and in the setting of alcohol intoxication  - APAP and salicylate levels undetectable  - She denies SI or HI  - She is experiencing visual hallucination and marked sinus tachycardia  - Poison control consulted from ED, advising observation in hospital with serial EKG's, bicarbonate as needed for QRS-widening, prn Ativan  - Social work consultation requested; sitter at bedside  2. Acute alcohol intoxication  - Pt presents with acute intoxication, EtOH level 168  - Monitor for withdrawal, use Ativan prn    3. Polysubstance abuse  - UDS is pending  -  Recently presented to ED with opiate overdose, reversed with Narcan  - Social work consultation requested    4. Depression, anxiety  - Difficult to assess on admission given her clinical state  - Managed with Wellbutrin, Topomax, and Klonopin at home - Continue Topomax as tolerated, hold Wellbutrin, hold Klonopin and use prn Ativan IVP's for now    5. Type II DM  - A1c had been 7.2% in 2017, down to 5.3% in March 2018 after marked wt-loss  - Not currently on medications - Serum glucose 154 on admission  - Check CBG's and use low-intensity Novolog sliding-scale as needed   6. Hypokalemia  - Potassium is 3.3 on admission without EKG changes  - KCl added to her IVF  - Monitoring on telemetry; chem panel will be repeated in am   DVT prophylaxis: sq Lovenox Code Status: Full  Family Communication: Discussed with patient Disposition Plan: Observe in SDU Consults called: PCCM  Admission status: Observation    Briscoe Deutscher, MD [facility] Pager [phone]  If 7PM-7AM, please contact night-coverage [email or web address] Password [email or web address]  08/16/2016, 11:41 PM

## 2016-08-16 NOTE — ED Notes (Signed)
Called poison control:  Ad[facility]  24 hr admission  possible 9 gram Wellbutrin exposure with ETOH \ EKG monitor ever 3 to 4 hrs  __________________- recommendations  Look for QT-C prolongation  Wide QRS  QT mag  Benzo  Bicarb on stand by

## 2016-08-16 NOTE — ED Notes (Signed)
Pt is actively hallucinating.  Attempting to get OOB.  Will continue to monitor.

## 2016-08-16 NOTE — ED Provider Notes (Signed)
Pt seen and evaluated. Ingested massive amount Wellbutrin. Tachy, confused, apparently hallucinating. Tachy, dry MM,/red skin vs sunburn. Anticholinergic toxidrome.   QTC<500. Agree with management--Ativan, ivf, QTC monitoring.   Pt not rigid or hyperthermic, doubt Serotonin syndrome.  Poison control recommends 24 hours of monitoring.   Rolland PorterJames, Willowdean Luhmann, MD 08/16/16 2241

## 2016-08-16 NOTE — ED Provider Notes (Signed)
[facility]-EMERGENCY DEPT Provider Note   CSN: [id number] Arrival date & time: 08/16/16  2114     History   Chief Complaint No chief complaint on file.   HPI Crystal Castillo is a 26 y.o. female with past medical history of substance abuse, overdose, PTSD and previous suicide attempts who presents the emergency department with overdose. The patient took more than 60 tablets of Wellbutrin earlier today along with alcohol in attempt to control herself. She is actively hallucinating. There is a level V caveat due to altered mental status.  HPI  Past Medical History:  Diagnosis Date  . Anorexia nervosa with bulimia    just finished treatment in August 2012 for this  . Anxiety   . Bartholin's cyst   . Depression   . Diabetes mellitus    not currently treated for this. Pt states she was told she had DM, but lost @  150 lbs in 9 months.  . Diabetes mellitus without complication ([name])   . Obesity   . PTSD (post-traumatic stress disorder)     Patient Active Problem List   Diagnosis Date Noted  . Hidradenitis 09/13/2015    Past Surgical History:  Procedure Laterality Date  . TONSILLECTOMY    . WISDOM TOOTH EXTRACTION      OB History    Gravida Para Term Preterm AB Living   0 0 0 0 0     SAB TAB Ectopic Multiple Live Births   0 0 0           Home Medications    Prior to Admission medications   Medication Sig Start Date End Date Taking? Authorizing Provider  buPROPion (WELLBUTRIN SR) 150 MG 12 hr tablet Take 150 mg by mouth 2 (two) times daily.  07/18/15 07/21/16  [provider]  ciprofloxacin (CIPRO) 500 MG tablet Take 1 tablet (500 mg total) by mouth 2 (two) times daily. Patient not taking: Reported on 07/21/2016 09/13/15   Reva Bores, MD  clonazePAM (KLONOPIN) 1 MG tablet TAKE ONE TABLET BY MOUTH THREE TIMES A DAY AS NEEDED FOR ANXIETY 08/15/15   [provider]  fluconazole (DIFLUCAN) 150 MG tablet Take one tablet every third day if yeast infection  develops. Patient not taking: Reported on 07/21/2016 09/13/15   Reva Bores, MD  promethazine (PHENERGAN) 25 MG tablet Take 1 tablet (25 mg total) by mouth every 6 (six) hours as needed for nausea or vomiting. Patient not taking: Reported on 07/21/2016 09/17/15   Dan Humphreys, MD  topiramate (TOPAMAX) 25 MG tablet TAKE TWO TABLETS (50 MG TOTAL) BY MOUTH 2 (TWO) TIMES DAILY. 07/18/15   [provider]    Family History Family History  Problem Relation Age of Onset  . Diabetes Mother     Social History Social History  Substance Use Topics  . Smoking status: Former Smoker    Packs/day: 0.50    Types: Cigarettes  . Smokeless tobacco: Never Used     Comment: rarely smokes  . Alcohol use Yes     Comment: occasional drinker - 2 or 3 times a week     Allergies   Quetiapine; Sulfa antibiotics; and Sulfa antibiotics   Review of Systems Review of Systems Unable to review systems due to her mental status  Physical Exam Updated Vital Signs There were no vitals taken for this visit.  Physical Exam  Constitutional: She appears well-developed and well-nourished.  HENT:  Head: Normocephalic and atraumatic.  Eyes:  Mydriatic pupils  Neck: Normal range of motion. Neck supple.  Cardiovascular: Normal heart sounds.   No murmur heard. Tachycardic to the 150s  Pulmonary/Chest: Effort normal and breath sounds normal.  Abdominal: Soft.  Skin:  Hot, erythematous skin  Psychiatric:  Agitated, actively hallucinating  Nursing note and vitals reviewed.    ED Treatments / Results  Labs (all labs ordered are listed, but only abnormal results are displayed) Labs Reviewed - No data to display  EKG  EKG Interpretation None       Radiology No results found.  Procedures .Critical Care Performed by: Arthor CaptainHARRIS, Jemarcus Dougal Authorized by: Arthor CaptainHARRIS, Elah Avellino   Critical care provider statement:    Critical care time (minutes):  70   Critical care was necessary to treat or prevent  imminent or life-threatening deterioration of the following conditions:  Toxidrome   Critical care was time spent personally by me on the following activities:  Discussions with consultants, evaluation of patient's response to treatment, examination of patient, interpretation of cardiac output measurements, ordering and performing treatments and interventions, ordering and review of laboratory studies, pulse oximetry, re-evaluation of patient's condition and review of old charts   (including critical care time)  Medications Ordered in ED Medications - No data to display   Initial Impression / Assessment and Plan / ED Course  I have reviewed the triage vital signs and the nursing notes.  Pertinent labs & imaging results that were available during my care of the patient were reviewed by me and considered in my medical decision making (see chart for details).     Patient with overdose of Wellbutrin TAKEN 2 HOURS PTA. She is tachycardic, actively hallucinating. I have placed the patient under IVC. No evidence of QT prolongation at this time (QT 279). [facility]. Patient will need at least 24 hours of observation with the potential for QT prolongation and torsades.  needs bicarb if the QRS widens.Patient also appears to be hyper adrenergic.  11:01 PM Patient continues to actively hallucinate. She is persistently tachycardic. She has received 2 mg of IV Ativan. I have ordered a repeat EKG. Patient has not been able to produce urine yet. A bedside ultrasound revealed no urinary retention. Patient is receiving IV fluids.   Patient repeat EKG  Shows no widening of the QRS or QT prolongation. Patient admitted by Dr. To stepdown unit. I have also spoken with Dr. Kendrick FriesMcQuaid who will follow the patient through healing. Final Clinical Impressions(s) / ED Diagnoses   Final diagnoses:  Intentional overdose of selective serotonin reuptake inhibitor  (SSRI), initial encounter [facility]([name])    New Prescriptions New Prescriptions   No medications on file     Arthor CaptainHarris, Kenny Stern, PA-C 08/17/16 0127    Arthor CaptainHarris, Seirra Kos, PA-C 08/17/16 0130    Rolland PorterJames, Mark, MD 08/26/16 1306

## 2016-08-17 DIAGNOSIS — F1020 Alcohol dependence, uncomplicated: Secondary | ICD-10-CM | POA: Diagnosis not present

## 2016-08-17 DIAGNOSIS — T1491XA Suicide attempt, initial encounter: Secondary | ICD-10-CM | POA: Diagnosis not present

## 2016-08-17 DIAGNOSIS — Z87891 Personal history of nicotine dependence: Secondary | ICD-10-CM | POA: Diagnosis not present

## 2016-08-17 DIAGNOSIS — F1120 Opioid dependence, uncomplicated: Secondary | ICD-10-CM | POA: Diagnosis not present

## 2016-08-17 DIAGNOSIS — F418 Other specified anxiety disorders: Secondary | ICD-10-CM | POA: Diagnosis present

## 2016-08-17 DIAGNOSIS — E119 Type 2 diabetes mellitus without complications: Secondary | ICD-10-CM | POA: Diagnosis present

## 2016-08-17 DIAGNOSIS — F1910 Other psychoactive substance abuse, uncomplicated: Secondary | ICD-10-CM | POA: Diagnosis not present

## 2016-08-17 DIAGNOSIS — F10929 Alcohol use, unspecified with intoxication, unspecified: Secondary | ICD-10-CM | POA: Diagnosis not present

## 2016-08-17 DIAGNOSIS — F332 Major depressive disorder, recurrent severe without psychotic features: Secondary | ICD-10-CM | POA: Diagnosis not present

## 2016-08-17 DIAGNOSIS — R441 Visual hallucinations: Secondary | ICD-10-CM | POA: Diagnosis present

## 2016-08-17 DIAGNOSIS — T50902A Poisoning by unspecified drugs, medicaments and biological substances, intentional self-harm, initial encounter: Secondary | ICD-10-CM | POA: Diagnosis not present

## 2016-08-17 DIAGNOSIS — Z7984 Long term (current) use of oral hypoglycemic drugs: Secondary | ICD-10-CM | POA: Diagnosis not present

## 2016-08-17 DIAGNOSIS — T43292A Poisoning by other antidepressants, intentional self-harm, initial encounter: Secondary | ICD-10-CM

## 2016-08-17 DIAGNOSIS — R000 Tachycardia, unspecified: Secondary | ICD-10-CM | POA: Diagnosis not present

## 2016-08-17 DIAGNOSIS — F329 Major depressive disorder, single episode, unspecified: Secondary | ICD-10-CM | POA: Diagnosis not present

## 2016-08-17 DIAGNOSIS — Y906 Blood alcohol level of 120-199 mg/100 ml: Secondary | ICD-10-CM | POA: Diagnosis present

## 2016-08-17 DIAGNOSIS — Z888 Allergy status to other drugs, medicaments and biological substances status: Secondary | ICD-10-CM | POA: Diagnosis not present

## 2016-08-17 DIAGNOSIS — T43222A Poisoning by selective serotonin reuptake inhibitors, intentional self-harm, initial encounter: Secondary | ICD-10-CM | POA: Diagnosis present

## 2016-08-17 DIAGNOSIS — F603 Borderline personality disorder: Secondary | ICD-10-CM | POA: Diagnosis not present

## 2016-08-17 DIAGNOSIS — F17210 Nicotine dependence, cigarettes, uncomplicated: Secondary | ICD-10-CM | POA: Diagnosis not present

## 2016-08-17 DIAGNOSIS — Z915 Personal history of self-harm: Secondary | ICD-10-CM | POA: Diagnosis not present

## 2016-08-17 DIAGNOSIS — F13239 Sedative, hypnotic or anxiolytic dependence with withdrawal, unspecified: Secondary | ICD-10-CM | POA: Diagnosis not present

## 2016-08-17 DIAGNOSIS — F10129 Alcohol abuse with intoxication, unspecified: Secondary | ICD-10-CM | POA: Diagnosis present

## 2016-08-17 DIAGNOSIS — Z882 Allergy status to sulfonamides status: Secondary | ICD-10-CM | POA: Diagnosis not present

## 2016-08-17 DIAGNOSIS — E876 Hypokalemia: Secondary | ICD-10-CM | POA: Diagnosis present

## 2016-08-17 DIAGNOSIS — A599 Trichomoniasis, unspecified: Secondary | ICD-10-CM | POA: Diagnosis present

## 2016-08-17 DIAGNOSIS — T50902D Poisoning by unspecified drugs, medicaments and biological substances, intentional self-harm, subsequent encounter: Secondary | ICD-10-CM | POA: Diagnosis not present

## 2016-08-17 DIAGNOSIS — F4310 Post-traumatic stress disorder, unspecified: Secondary | ICD-10-CM | POA: Diagnosis present

## 2016-08-17 LAB — CBC WITH DIFFERENTIAL/PLATELET
Basophils Absolute: 0 10*3/uL (ref 0.0–0.1)
Basophils Relative: 0 %
Eosinophils Absolute: 0 10*3/uL (ref 0.0–0.7)
Eosinophils Relative: 0 %
HCT: 40.6 % (ref 36.0–46.0)
HEMOGLOBIN: 13.9 g/dL (ref 12.0–15.0)
LYMPHS ABS: 1.9 10*3/uL (ref 0.7–4.0)
LYMPHS PCT: 25 %
MCH: 33.8 pg (ref 26.0–34.0)
MCHC: 34.2 g/dL (ref 30.0–36.0)
MCV: 98.8 fL (ref 78.0–100.0)
Monocytes Absolute: 0.4 10*3/uL (ref 0.1–1.0)
Monocytes Relative: 5 %
NEUTROS PCT: 70 %
Neutro Abs: 5.4 10*3/uL (ref 1.7–7.7)
Platelets: 226 10*3/uL (ref 150–400)
RBC: 4.11 MIL/uL (ref 3.87–5.11)
RDW: 12.4 % (ref 11.5–15.5)
WBC: 7.6 10*3/uL (ref 4.0–10.5)

## 2016-08-17 LAB — COMPREHENSIVE METABOLIC PANEL
ALK PHOS: 39 U/L (ref 38–126)
ALT: 35 U/L (ref 14–54)
AST: 28 U/L (ref 15–41)
Albumin: 3.8 g/dL (ref 3.5–5.0)
Anion gap: 11 (ref 5–15)
BUN: 8 mg/dL (ref 6–20)
CALCIUM: 8 mg/dL — AB (ref 8.9–10.3)
CO2: 16 mmol/L — AB (ref 22–32)
CREATININE: 0.63 mg/dL (ref 0.44–1.00)
Chloride: 117 mmol/L — ABNORMAL HIGH (ref 101–111)
GFR calc non Af Amer: >60 mL/min (ref >60)
Glucose, Bld: 132 mg/dL — ABNORMAL HIGH (ref 65–99)
Potassium: 3.6 mmol/L (ref 3.5–5.1)
SODIUM: 144 mmol/L (ref 135–145)
Total Bilirubin: 0.6 mg/dL (ref 0.3–1.2)
Total Protein: 6.9 g/dL (ref 6.5–8.1)

## 2016-08-17 LAB — GLUCOSE, CAPILLARY
GLUCOSE-CAPILLARY: 119 mg/dL — AB (ref 65–99)
GLUCOSE-CAPILLARY: 136 mg/dL — AB (ref 65–99)
GLUCOSE-CAPILLARY: 144 mg/dL — AB (ref 65–99)
GLUCOSE-CAPILLARY: 146 mg/dL — AB (ref 65–99)
GLUCOSE-CAPILLARY: 96 mg/dL (ref 65–99)
Glucose-Capillary: 137 mg/dL — ABNORMAL HIGH (ref 65–99)

## 2016-08-17 LAB — URINALYSIS, ROUTINE W REFLEX MICROSCOPIC
Bilirubin Urine: NEGATIVE
GLUCOSE, UA: NEGATIVE mg/dL
HGB URINE DIPSTICK: NEGATIVE
Ketones, ur: NEGATIVE mg/dL
LEUKOCYTES UA: NEGATIVE
Nitrite: NEGATIVE
Protein, ur: NEGATIVE mg/dL
SPECIFIC GRAVITY, URINE: 1.024 (ref 1.005–1.030)
pH: 5 (ref 5.0–8.0)

## 2016-08-17 LAB — MRSA PCR SCREENING: MRSA by PCR: NEGATIVE

## 2016-08-17 LAB — RAPID URINE DRUG SCREEN, HOSP PERFORMED
Amphetamines: NOT DETECTED
BARBITURATES: NOT DETECTED
BENZODIAZEPINES: POSITIVE — AB
Cocaine: NOT DETECTED
Opiates: NOT DETECTED
TETRAHYDROCANNABINOL: POSITIVE — AB

## 2016-08-17 LAB — ETHANOL: Alcohol, Ethyl (B): <5 mg/dL (ref <5)

## 2016-08-17 LAB — MAGNESIUM: Magnesium: 1.6 mg/dL — ABNORMAL LOW (ref 1.7–2.4)

## 2016-08-17 LAB — HIV ANTIBODY (ROUTINE TESTING W REFLEX): HIV Screen 4th Generation wRfx: NONREACTIVE

## 2016-08-17 MED ORDER — INSULIN ASPART 100 UNIT/ML ~~LOC~~ SOLN
0.0000 [IU] | Freq: Three times a day (TID) | SUBCUTANEOUS | Status: DC
  Administered 2016-08-17 – 2016-08-18 (×3): 1 [IU] via SUBCUTANEOUS

## 2016-08-17 MED ORDER — NICOTINE 14 MG/24HR TD PT24
14.0000 mg | MEDICATED_PATCH | Freq: Every day | TRANSDERMAL | Status: DC
  Administered 2016-08-17 – 2016-08-18 (×2): 14 mg via TRANSDERMAL
  Filled 2016-08-17 (×2): qty 1

## 2016-08-17 MED ORDER — METRONIDAZOLE 500 MG PO TABS: 250.0000 mg | ORAL_TABLET | Freq: Three times a day (TID) | ORAL | Status: DC
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
## 2016-08-17 NOTE — Progress Notes (Signed)
eLink Physician-Brief Progress Note Patient Name: Crystal Castillo DOB: 12/16/1990 MRN: [id number]   Date of Service  08/17/2016  HPI/Events of Note  wellbutrin OD Admitted from ER On camera check: conversant but confused, redirectable, awake, watching television BP OK tachycardic  eICU Interventions  Continue current management     Intervention Category Evaluation Type: New Patient Evaluation  Max FickleDouglas McQuaid 08/17/2016, 12:51 AM

## 2016-08-17 NOTE — Clinical Social Work Note (Signed)
Clinical Social Work Assessment  Patient Details  Name: Crystal Castillo MRN: [id number] Date of Birth: 09-Jun-1990  Date of referral:  08/17/16               Reason for consult:  Substance Use/ETOH Abuse, Suicide Risk/Attempt                Permission sought to share information with:  Psychiatrist Permission granted to share information::     Name::        Agency::     Relationship::     Contact Information:     Housing/Transportation Living arrangements for the past 2 months:  Single Family Home Source of Information:  Patient Patient Interpreter Needed:  None Criminal Activity/Legal Involvement Pertinent to Current Situation/Hospitalization:  No - Comment as needed Significant Relationships:  Significant Other Lives with:  Friends Do you feel safe going back to the place where you live?  Yes Need for family participation in patient care:  No (Coment)  Care giving concerns:  Patient admitted for intentional Overdose (Wellbutrin)   Social Worker assessment / plan: CSW and psychiatrist met with patient at bedside, explain role and reason for consult. Patient alert, oriented and agreeable to talk.  She reports, "I did not know what I was thinking, I wasn't thinking." Patient denies intentional overdose, She reports she was drinking with friends then took pills after drinking alcohol.  Patient reports she drinks six beers or more several days out of the week. Patient reports ," I do not have DT's or withdrawals from alcohol, I do not think I have a drinking problem." Patient reports has not been to treatment program for her drinking. Patient has history of  behavioral health hospitalizations "too many times to count" as a child and then one time at the age of 46  "after informing staff at [facility] that I could not live without my medication, they committed me to [facility]." Patient denies any recent hospitalizations.   Patient reports her parents are deceased, her mother died in  1999-04-28 from health complications and her father died in May 29, 2022 of this year from pneumonia. Patient became tearful, "I have tried not to think about it."  She reports haivng a sister locally but not close to, she currenlty lives with her boyfriend.   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 Patient reports she was only using for six months. Patient reports trying other things.    Plan: Inpatient psychiatric hospital once medically stable. Patient is under IVC. IVC paper signed and completed on chart.   Employment status:  Unemployed Forensic scientist:  Other (Comment Required) Nurse, mental health) PT Recommendations:  Not assessed at this time Information / Referral to community resources:  Inpatient Psychiatric Care Patient received information about outpatient [place] but has not followed up. She reports she has put effort to make appointment.   Patient/Family's Response to care:  Patient reports her boyfriend visited and brought her clothes for when she transfers to [facility]. Patient prefers to go [place] but understands bed placement may be limited and may have to seek placement outside of [place]. Patient appreciative of CSW services.   Patient/Family's Understanding of and Emotional Response to Diagnosis, Current Treatment, and Prognosis:  " I am going to miss my Birthday this [birth year]28[birth year], but I have no choice but to go over there and get help."  Emotional Assessment Appearance:  Developmentally appropriate Attitude/Demeanor/Rapport:    Affect (  typically observed):  Tearful/Crying,  Orientation:  Oriented to Self, Oriented to Place, Oriented to  Time, Oriented to Situation Alcohol / Substance use:  Alcohol Use Psych involvement (Current and /or in the community):  Yes   Discharge Needs  Concerns to be addressed:  Discharge Planning Concerns, Mental Health Concerns, Substance Abuse Concerns Readmission within the last 30  days:  Yes Current discharge risk:  None Barriers to Discharge:  Continued Medical Work up   [employer], [place] 08/17/2016, 08/18/2016  2:49 PM

## 2016-08-18 ENCOUNTER — Inpatient Hospital Stay
Admission: EM | Admit: 2016-08-18 | Discharge: 2016-08-24 | DRG: 885 | Disposition: A | Discharge status: Home / Self Care | Payer: BLUE CROSS/BLUE SHIELD | Attending: Psychiatry | Admitting: Psychiatry

## 2016-08-18 DIAGNOSIS — Z79899 Other long term (current) drug therapy: Secondary | ICD-10-CM

## 2016-08-18 DIAGNOSIS — F13239 Sedative, hypnotic or anxiolytic dependence with withdrawal, unspecified: Secondary | ICD-10-CM | POA: Diagnosis present

## 2016-08-18 DIAGNOSIS — F17200 Nicotine dependence, unspecified, uncomplicated: Secondary | ICD-10-CM

## 2016-08-18 DIAGNOSIS — Z811 Family history of alcohol abuse and dependence: Secondary | ICD-10-CM

## 2016-08-18 DIAGNOSIS — N760 Acute vaginitis: Secondary | ICD-10-CM | POA: Diagnosis present

## 2016-08-18 DIAGNOSIS — N390 Urinary tract infection, site not specified: Secondary | ICD-10-CM | POA: Diagnosis present

## 2016-08-18 DIAGNOSIS — F4310 Post-traumatic stress disorder, unspecified: Secondary | ICD-10-CM | POA: Diagnosis present

## 2016-08-18 DIAGNOSIS — T50902A Poisoning by unspecified drugs, medicaments and biological substances, intentional self-harm, initial encounter: Secondary | ICD-10-CM | POA: Diagnosis present

## 2016-08-18 DIAGNOSIS — F603 Borderline personality disorder: Secondary | ICD-10-CM

## 2016-08-18 DIAGNOSIS — F17210 Nicotine dependence, cigarettes, uncomplicated: Secondary | ICD-10-CM | POA: Diagnosis present

## 2016-08-18 DIAGNOSIS — E119 Type 2 diabetes mellitus without complications: Secondary | ICD-10-CM

## 2016-08-18 DIAGNOSIS — T50902D Poisoning by unspecified drugs, medicaments and biological substances, intentional self-harm, subsequent encounter: Secondary | ICD-10-CM

## 2016-08-18 DIAGNOSIS — F10929 Alcohol use, unspecified with intoxication, unspecified: Secondary | ICD-10-CM

## 2016-08-18 DIAGNOSIS — Z62810 Personal history of physical and sexual abuse in childhood: Secondary | ICD-10-CM | POA: Diagnosis present

## 2016-08-18 DIAGNOSIS — G4700 Insomnia, unspecified: Secondary | ICD-10-CM | POA: Diagnosis present

## 2016-08-18 DIAGNOSIS — F332 Major depressive disorder, recurrent severe without psychotic features: Secondary | ICD-10-CM | POA: Diagnosis present

## 2016-08-18 DIAGNOSIS — F13939 Sedative, hypnotic or anxiolytic use, unspecified with withdrawal, unspecified: Secondary | ICD-10-CM

## 2016-08-18 DIAGNOSIS — F1020 Alcohol dependence, uncomplicated: Secondary | ICD-10-CM

## 2016-08-18 DIAGNOSIS — F1120 Opioid dependence, uncomplicated: Secondary | ICD-10-CM

## 2016-08-18 DIAGNOSIS — Z915 Personal history of self-harm: Secondary | ICD-10-CM | POA: Diagnosis not present

## 2016-08-18 LAB — GLUCOSE, CAPILLARY
GLUCOSE-CAPILLARY: 138 mg/dL — AB (ref 65–99)
GLUCOSE-CAPILLARY: 101 mg/dL — AB (ref 65–99)
GLUCOSE-CAPILLARY: 93 mg/dL (ref 65–99)
Glucose-Capillary: 126 mg/dL — ABNORMAL HIGH (ref 65–99)

## 2016-08-18 LAB — BASIC METABOLIC PANEL
Anion gap: 8 (ref 5–15)
BUN: 5 mg/dL — AB (ref 6–20)
CALCIUM: 8.6 mg/dL — AB (ref 8.9–10.3)
CHLORIDE: 109 mmol/L (ref 101–111)
CO2: 22 mmol/L (ref 22–32)
Creatinine, Ser: 0.58 mg/dL (ref 0.44–1.00)
GFR calc Af Amer: >60 mL/min (ref >60)
GFR calc non Af Amer: >60 mL/min (ref >60)
Glucose, Bld: 110 mg/dL — ABNORMAL HIGH (ref 65–99)
Potassium: 2.9 mmol/L — ABNORMAL LOW (ref 3.5–5.1)
SODIUM: 139 mmol/L (ref 135–145)

## 2016-08-18 LAB — MAGNESIUM: MAGNESIUM: 1.9 mg/dL (ref 1.7–2.4)

## 2016-08-18 LAB — POTASSIUM: POTASSIUM: 4.1 mmol/L (ref 3.5–5.1)

## 2016-08-18 MED ORDER — ALUM & MAG HYDROXIDE-SIMETH 200-200-20 MG/5ML PO SUSP: 30.0000 mL | ORAL | Status: DC | PRN

## 2016-08-18 MED ORDER — POTASSIUM CHLORIDE CRYS ER 20 MEQ PO TBCR
40.0000 meq | EXTENDED_RELEASE_TABLET | Freq: Two times a day (BID) | ORAL | Status: DC
  Administered 2016-08-18: 40 meq via ORAL
  Filled 2016-08-18: qty 2

## 2016-08-18 MED ORDER — POTASSIUM CHLORIDE CRYS ER 20 MEQ PO TBCR
40.0000 meq | EXTENDED_RELEASE_TABLET | Freq: Two times a day (BID) | ORAL | Status: AC
  Administered 2016-08-18: 40 meq via ORAL
  Filled 2016-08-18: qty 2

## 2016-08-18 MED ORDER — ONDANSETRON HCL 4 MG/2ML IJ SOLN
4.0000 mg | Freq: Four times a day (QID) | INTRAMUSCULAR | Status: DC | PRN
  Administered 2016-08-18: 4 mg via INTRAVENOUS
  Filled 2016-08-18: qty 2

## 2016-08-18 MED ORDER — INSULIN ASPART 100 UNIT/ML ~~LOC~~ SOLN: 0.0000 [IU] | Freq: Three times a day (TID) | SUBCUTANEOUS | Status: DC

## 2016-08-18 MED ORDER — NICOTINE 14 MG/24HR TD PT24
14.0000 mg | MEDICATED_PATCH | Freq: Every day | TRANSDERMAL | Status: DC
  Administered 2016-08-19 – 2016-08-24 (×6): 14 mg via TRANSDERMAL
  Filled 2016-08-18 (×6): qty 1

## 2016-08-18 MED ORDER — LORAZEPAM 2 MG PO TABS
2.0000 mg | ORAL_TABLET | Freq: Three times a day (TID) | ORAL | Status: DC | PRN
  Administered 2016-08-18 – 2016-08-19 (×2): 2 mg via ORAL
  Filled 2016-08-18 (×2): qty 1

## 2016-08-18 MED ORDER — MAGNESIUM HYDROXIDE 400 MG/5ML PO SUSP: 30.0000 mL | Freq: Every day | ORAL | Status: DC | PRN

## 2016-08-18 MED ORDER — ACETAMINOPHEN 325 MG PO TABS: 650.0000 mg | ORAL_TABLET | Freq: Four times a day (QID) | ORAL | Status: DC | PRN

## 2016-08-18 MED ORDER — INSULIN ASPART 100 UNIT/ML ~~LOC~~ SOLN: 0.0000 [IU] | Freq: Every day | SUBCUTANEOUS | Status: DC

## 2016-08-18 NOTE — Discharge Summary (Signed)
Physician Discharge Summary  Crystal Castillo ZOX:[id number] DOB: May 23, 1990 DOA: 08/16/2016  PCP: Eartha Inch, MD  Admit date: 08/16/2016 Discharge date: 08/18/2016  Admitted From: home.  Disposition:  [facility]  Recommendations for Outpatient Follow-up:  1. Follow up with PCP in 1-2 weeks 2. Please obtain BMP/CBC in one week 3. Please follow up with psychiatry at [facility].    Discharge Condition:stable.  CODE STATUS:full code.  Diet recommendation: carb modified diet.    Brief/Interim Summary: 26 year old with h/o type 2 DM, polysubstance abuse, depression, anxiety admitted for intentional overdose of wellbutrin, in addition to taking alcohol. She was monitored for 48 hours in step down and transferred to floor. She remains asymptomatic. Psychiatry consulted and recommended inpatient psychiatry hospitalization.  Discharge Diagnoses:  Principal Problem:   Drug overdose, intentional ([name]) Active Problems:   Sinus tachycardia   Acute alcohol intoxication ([name])   Depression with anxiety   Polysubstance abuse   Diabetes mellitus type II, non insulin dependent ([name])   Drug overdose, intentional, initial encounter ([name])   Trichimoniasis   Drug overdose  Intentional Wellbutrin Overdose:  Pt ingested 60 tablets of wellbutrin, poison control consulted and plan for serial EKG's, .last EKG Qtc is 494. Suicidal precautions.  Sitter at bedside.  Pt appears medically stable to be discharged when bed available at [facility], probably in am.    Hypokalemia: repleted.  Repeat and if improved K, can d/c to [facility].   Alcohol intoxication:  No signs of withdrawal.  Prn ativan.    Nausea: resolved with zofran.    POlysubstance abuse:  UDS positive for benzo and THC,  Counseled.  SW on board.    Depression and anxiety:  Pt denies any symptoms of depression or anxiety a this time.    Type 2 DM:  CBG (last 3)   Recent Labs (last 2 labs)    Recent Labs  08/17/16 1949 08/18/16 0804  08/18/16 1204  GLUCAP 146* 126* 138*      RESUME SSI while inpatient. Recommended oral metformin, pt wants to talk to her PCP before starting the medication.  A1c IS around 7.    Hypomagnesemia: replaced.     Discharge Instructions   Allergies as of 08/18/2016      Reactions   Quetiapine Itching   Sulfa Antibiotics Nausea Only   Sulfa Antibiotics Nausea Only      Medication List    STOP taking these medications   buPROPion 150 MG 12 hr tablet Commonly known as:  WELLBUTRIN SR   clonazePAM 1 MG tablet Commonly known as:  KLONOPIN   topiramate 25 MG tablet Commonly known as:  TOPAMAX       Allergies  Allergen Reactions  . Quetiapine Itching  . Sulfa Antibiotics Nausea Only  . Sulfa Antibiotics Nausea Only    Consultations:  Psychiatry.    Procedures/Studies: No results found.    Subjective: No new complaints. No chest pain or sob.  Slight nausea earlier today. None at this time.  No vomiting or abd pain.  No headache or dizziness.  No palpitations.   Discharge Exam: Vitals:   08/18/16 1443 08/18/16 1600  BP: 125/73   Pulse: 98   Resp: (!) 22   Temp: 97.6 F (36.4 C) 98.4 F (36.9 C)   Vitals:   08/18/16 0800 08/18/16 1200 08/18/16 1443 08/18/16 1600  BP: 111/64  125/73   Pulse: 92  98   Resp: (!) 25  (!) 22   Temp: 98.3 F (36.8  C) 97.8 F (36.6 C) 97.6 F (36.4 C) 98.4 F (36.9 C)  TempSrc: Oral Oral Oral Oral  SpO2: 96%  99%   Weight:      Height:        General: Pt is alert, awake, not in acute distress Cardiovascular: RRR, S1/S2 +, no rubs, no gallops Respiratory: CTA bilaterally, no wheezing, no rhonchi Abdominal: Soft, NT, ND, bowel sounds + Extremities: no edema, no cyanosis    The results of significant diagnostics from this hospitalization (including imaging, microbiology, ancillary and laboratory) are listed below for reference.     Microbiology: Recent Results (from the past 240 hour(s))  MRSA PCR  Screening     Status: None   Collection Time: 08/17/16 12:44 AM  Result Value Ref Range Status   MRSA by PCR NEGATIVE NEGATIVE Final    Comment:        The GeneXpert MRSA Assay (FDA approved for NASAL specimens only), is one component of a comprehensive MRSA colonization surveillance program. It is not intended to diagnose MRSA infection nor to guide or monitor treatment for MRSA infections.      Labs: BNP (last 3 results) No results for input(s): BNP in the last 8760 hours. Basic Metabolic Panel:  Recent Labs Lab 08/16/16 2140 08/17/16 0340 08/18/16 0317  NA 144 144 139  K 3.3* 3.6 2.9*  CL 113* 117* 109  CO2 18* 16* 22  GLUCOSE 154* 132* 110*  BUN 9 8 5*  CREATININE 0.68 0.63 0.58  CALCIUM 9.2 8.0* 8.6*  MG  --  1.6* 1.9   Liver Function Tests:  Recent Labs Lab 08/16/16 2140 08/17/16 0340  AST 27 28  ALT 41 35  ALKPHOS 47 39  BILITOT 0.4 0.6  PROT 8.0 6.9  ALBUMIN 4.4 3.8   No results for input(s): LIPASE, AMYLASE in the last 168 hours. No results for input(s): AMMONIA in the last 168 hours. CBC:  Recent Labs Lab 08/16/16 2140 08/17/16 0340  WBC 7.9 7.6  NEUTROABS  --  5.4  HGB 16.0* 13.9  HCT 45.0 40.6  MCV 97.4 98.8  PLT 252 226   Cardiac Enzymes:  Recent Labs Lab 08/16/16 2216  CKTOTAL 84   BNP: Invalid input(s): POCBNP CBG:  Recent Labs Lab 08/17/16 1709 08/17/16 1949 08/18/16 0804 08/18/16 1204 08/18/16 1629  GLUCAP 136* 146* 126* 138* 101*   D-Dimer No results for input(s): DDIMER in the last 72 hours. Hgb A1c No results for input(s): HGBA1C in the last 72 hours. Lipid Profile No results for input(s): CHOL, HDL, LDLCALC, TRIG, CHOLHDL, LDLDIRECT in the last 72 hours. Thyroid function studies No results for input(s): TSH, T4TOTAL, T3FREE, THYROIDAB in the last 72 hours.  Invalid input(s): FREET3 Anemia work up No results for input(s): VITAMINB12, FOLATE, FERRITIN, TIBC, IRON, RETICCTPCT in the last 72  hours. Urinalysis    Component Value Date/Time   COLORURINE YELLOW 08/16/2016 2337   APPEARANCEUR CLEAR 08/16/2016 2337   LABSPEC 1.024 08/16/2016 2337   PHURINE 5.0 08/16/2016 2337   GLUCOSEU NEGATIVE 08/16/2016 2337   HGBUR NEGATIVE 08/16/2016 2337   BILIRUBINUR NEGATIVE 08/16/2016 2337   KETONESUR NEGATIVE 08/16/2016 2337   PROTEINUR NEGATIVE 08/16/2016 2337   UROBILINOGEN 1.0 09/13/2015 0930   NITRITE NEGATIVE 08/16/2016 2337   LEUKOCYTESUR NEGATIVE 08/16/2016 2337   Sepsis Labs Invalid input(s): PROCALCITONIN,  WBC,  LACTICIDVEN Microbiology Recent Results (from the past 240 hour(s))  MRSA PCR Screening     Status: None   Collection Time: 08/17/16  12:44 AM  Result Value Ref Range Status   MRSA by PCR NEGATIVE NEGATIVE Final    Comment:        The GeneXpert MRSA Assay (FDA approved for NASAL specimens only), is one component of a comprehensive MRSA colonization surveillance program. It is not intended to diagnose MRSA infection nor to guide or monitor treatment for MRSA infections.      Time coordinating discharge: Over 30 minutes  SIGNED:   Kathlen ModyAKULA,Norberta Stobaugh, MD  [facility] 08/18/2016, 5:10 PM Pager   If 7PM-7AM, please contact night-coverage [email or web address] Password [email or web address]

## 2016-08-18 NOTE — Progress Notes (Signed)
PROGRESS NOTE    Crystal Castillo  ZOX:[id number] DOB: 12/21/1990 DOA: 08/16/2016 PCP: Eartha InchBadger, Michael C, MD   Brief Narrative: 26 year old with h/o type 2 DM, polysubstance abuse, depression, anxiety admitted for intentional overdose of wellbutrin, in addition to taking alcohol.  She was admitted to step down for close monitoring.   Assessment & Plan:   Principal Problem:   Drug overdose, intentional ([name]) Active Problems:   Sinus tachycardia   Acute alcohol intoxication ([name])   Depression with anxiety   Polysubstance abuse   Diabetes mellitus type II, non insulin dependent ([name])   Drug overdose, intentional, initial encounter ([name])   Trichimoniasis   Drug overdose   Intentional Wellbutrin Overdose:  Pt ingested 60 tablets of wellbutrin, poison control consulted and plan for serial EKG's, . Suicidal precautions.  Sitter at bedside.  Pt appears medically stable to be [facility].    Hypokalemia: repleted.  Repeat in am.    Alcohol intoxication:  No signs of withdrawal.  Prn ativan.    Nausea: unclear etiology.  Prn zofran.    POlysubstance abuse:  UDS positive for benzo and THC,  Counseled.  SW on board.    Depression and anxiety:  Pt denies any symptoms of depression or anxiety a this time.    Type 2 DM:  CBG (last 3)   Recent Labs  08/17/16 1949 08/18/16 0804 08/18/16 1204  GLUCAP 146* 126* 138*    RESUME SSI while inpatient. Recommended oral metformin, pt wants to talk to her PCP before starting the medication.  A1c IS around 7.    Hypomagnesemia: replaced.    DVT prophylaxis: lovenox.  Code Status:full code.  Family Communication: sitter at bedside, no family at bedside, discussed the plan of care with pt.  [facility]    Consultants:   Psychiatry.    Procedures: none.    Antimicrobials:none.    Subjective: Slightly nauseated.  Wants to be discharged before  Friday as, its her b day.  No chest pain or sob.  No vomiting No  Diarrhea.    Objective: Vitals:   08/18/16 0700 08/18/16 0800 08/18/16 1200 08/18/16 1443  BP:  111/64  125/73  Pulse: 95 92  98  Resp: (!) 30 (!) 25  (!) 22  Temp:  98.3 F (36.8 C) 97.8 F (36.6 C) 97.6 F (36.4 C)  TempSrc:  Oral Oral Oral  SpO2: 97% 96%  99%  Weight:      Height:        Intake/Output Summary (Last 24 hours) at 08/18/16 1518 Last data filed at 08/18/16 1227  Gross per 24 hour  Intake              530 ml  Output             2700 ml  Net            -2170 ml   Filed Weights   08/17/16 0000 08/18/16 0500  Weight: 104.2 kg (229 lb 11.5 oz) 101.9 kg (224 lb 10.4 oz)    Examination:  General exam: Appears calm and comfortable  Respiratory system: Clear to auscultation. Respiratory effort normal. Cardiovascular system: S1 & S2 heard, RRR. No JVD, murmurs, rubs, gallops or clicks. No pedal edema. Gastrointestinal system: Abdomen is nondistended, soft and nontender. No organomegaly or masses felt. Normal bowel sounds heard. Central nervous system: Alert and oriented. No focal neurological deficits. Extremities: Symmetric  5 x 5 power. Skin: No rashes, lesions or ulcers Psychiatry: Judgement and insight appear normal. Mood & affect appropriate.     Data Reviewed: I have personally reviewed following labs and imaging studies  CBC:  Recent Labs Lab 08/16/16 2140 08/17/16 0340  WBC 7.9 7.6  NEUTROABS  --  5.4  HGB 16.0* 13.9  HCT 45.0 40.6  MCV 97.4 98.8  PLT 252 226   Basic Metabolic Panel:  Recent Labs Lab 08/16/16 2140 08/17/16 0340 08/18/16 0317  NA 144 144 139  K 3.3* 3.6 2.9*  CL 113* 117* 109  CO2 18* 16* 22  GLUCOSE 154* 132* 110*  BUN 9 8 5*  CREATININE 0.68 0.63 0.58  CALCIUM 9.2 8.0* 8.6*  MG  --  1.6* 1.9   GFR: Estimated Creatinine Clearance: 134.2 mL/min (by C-G formula based on SCr of 0.58 mg/dL). Liver Function Tests:  Recent Labs Lab  08/16/16 2140 08/17/16 0340  AST 27 28  ALT 41 35  ALKPHOS 47 39  BILITOT 0.4 0.6  PROT 8.0 6.9  ALBUMIN 4.4 3.8   No results for input(s): LIPASE, AMYLASE in the last 168 hours. No results for input(s): AMMONIA in the last 168 hours. Coagulation Profile: No results for input(s): INR, PROTIME in the last 168 hours. Cardiac Enzymes:  Recent Labs Lab 08/16/16 2216  CKTOTAL 84   BNP (last 3 results) No results for input(s): PROBNP in the last 8760 hours. HbA1C: No results for input(s): HGBA1C in the last 72 hours. CBG:  Recent Labs Lab 08/17/16 1157 08/17/16 1709 08/17/16 1949 08/18/16 0804 08/18/16 1204  GLUCAP 96 136* 146* 126* 138*   Lipid Profile: No results for input(s): CHOL, HDL, LDLCALC, TRIG, CHOLHDL, LDLDIRECT in the last 72 hours. Thyroid Function Tests: No results for input(s): TSH, T4TOTAL, FREET4, T3FREE, THYROIDAB in the last 72 hours. Anemia Panel: No results for input(s): VITAMINB12, FOLATE, FERRITIN, TIBC, IRON, RETICCTPCT in the last 72 hours. Sepsis Labs: No results for input(s): PROCALCITON, LATICACIDVEN in the last 168 hours.  Recent Results (from the past 240 hour(s))  MRSA PCR Screening     Status: None   Collection Time: 08/17/16 12:44 AM  Result Value Ref Range Status   MRSA by PCR NEGATIVE NEGATIVE Final    Comment:        The GeneXpert MRSA Assay (FDA approved for NASAL specimens only), is one component of a comprehensive MRSA colonization surveillance program. It is not intended to diagnose MRSA infection nor to guide or monitor treatment for MRSA infections.          Radiology Studies: No results found.      Scheduled Meds: . enoxaparin (LOVENOX) injection  40 mg Subcutaneous Q24H  . insulin aspart  0-9 Units Subcutaneous TID WC  . nicotine  14 mg Transdermal Daily  . potassium chloride  40 mEq Oral BID  . sodium chloride flush  3 mL Intravenous Q12H   Continuous Infusions:   LOS: 1 day    Time spent: 35  minutes.     Kathlen Mody, MD [facility] Pager [phone]   If 7PM-7AM, please contact night-coverage [email or web address] Password [email or web address] 08/18/2016, 3:18 PM

## 2016-08-18 NOTE — Progress Notes (Signed)
CSW informed physician [facility]. She reports  Patient has been [facility].  Patient assigned to room 306 by Kelly ServicesCharge RN Gwen. Accepting physician is Dr. Ardyth HarpsHernandez. Attending physician is Dr.Hernandez.  Call report to [phone].  Pt may not transport until after 1930. IVC  faxed to [phone] prior to pt's transport.  CSW called and [place]icemail for [place]. CSW provided nurses contact information and provided information to evening CSW.  IVC papers at nurses station.    Vivi BarrackNicole Brynnley Castillo, Crystal MajorsLCSWA, MSW Clinical Social Worker 5E and Psychiatric Service Line [phone] 08/18/2016  5:00 PM

## 2016-08-18 NOTE — Progress Notes (Signed)
Report given to Abidun at [facility]. Patient transferred via sheriff. IVC papers sent.

## 2016-08-18 NOTE — BH Assessment (Signed)
Josh, pt access made aware of pt acceptance.

## 2016-08-18 NOTE — BH Assessment (Signed)
[facility].  Patient assigned to room 306 by Kelly ServicesCharge RN Gwen. Accepting physician is Dr. Ardyth HarpsHernandez. Attending physician is Dr.Hernandez.  Call report to [phone].  Pt may not transport until after 1930. IVC papers requested to be faxed to [phone] prior to pt's transport.   Damita LackNicole, LCSWA informed of pt acceptance.

## 2016-08-19 DIAGNOSIS — F1120 Opioid dependence, uncomplicated: Secondary | ICD-10-CM

## 2016-08-19 DIAGNOSIS — F13939 Sedative, hypnotic or anxiolytic use, unspecified with withdrawal, unspecified: Secondary | ICD-10-CM

## 2016-08-19 DIAGNOSIS — F17200 Nicotine dependence, unspecified, uncomplicated: Secondary | ICD-10-CM

## 2016-08-19 DIAGNOSIS — F603 Borderline personality disorder: Secondary | ICD-10-CM

## 2016-08-19 DIAGNOSIS — F1020 Alcohol dependence, uncomplicated: Secondary | ICD-10-CM

## 2016-08-19 DIAGNOSIS — F13239 Sedative, hypnotic or anxiolytic dependence with withdrawal, unspecified: Secondary | ICD-10-CM

## 2016-08-19 DIAGNOSIS — F332 Major depressive disorder, recurrent severe without psychotic features: Principal | ICD-10-CM

## 2016-08-19 LAB — URINALYSIS, COMPLETE (UACMP) WITH MICROSCOPIC
Bilirubin Urine: NEGATIVE
Glucose, UA: NEGATIVE mg/dL
HGB URINE DIPSTICK: NEGATIVE
Ketones, ur: 5 mg/dL — AB
NITRITE: NEGATIVE
PROTEIN: NEGATIVE mg/dL
SPECIFIC GRAVITY, URINE: 1.02 (ref 1.005–1.030)
pH: 5 (ref 5.0–8.0)

## 2016-08-19 LAB — GLUCOSE, CAPILLARY
GLUCOSE-CAPILLARY: 94 mg/dL (ref 65–99)
GLUCOSE-CAPILLARY: 89 mg/dL (ref 65–99)
Glucose-Capillary: 105 mg/dL — ABNORMAL HIGH (ref 65–99)

## 2016-08-19 LAB — TSH: TSH: 1.569 u[IU]/mL (ref 0.350–4.500)

## 2016-08-19 MED ORDER — METRONIDAZOLE 500 MG PO TABS
500.0000 mg | ORAL_TABLET | Freq: Two times a day (BID) | ORAL | Status: DC
  Administered 2016-08-19 – 2016-08-24 (×10): 500 mg via ORAL
  Filled 2016-08-19 (×14): qty 1

## 2016-08-19 MED ORDER — NITROFURANTOIN MONOHYD MACRO 100 MG PO CAPS
100.0000 mg | ORAL_CAPSULE | Freq: Two times a day (BID) | ORAL | Status: DC
  Administered 2016-08-19 – 2016-08-21 (×5): 100 mg via ORAL
  Filled 2016-08-19 (×4): qty 1

## 2016-08-19 MED ORDER — ACETAMINOPHEN 500 MG PO TABS
1000.0000 mg | ORAL_TABLET | Freq: Four times a day (QID) | ORAL | Status: DC | PRN
  Administered 2016-08-19 – 2016-08-22 (×4): 1000 mg via ORAL
  Filled 2016-08-19 (×4): qty 2

## 2016-08-19 MED ORDER — CHLORDIAZEPOXIDE HCL 25 MG PO CAPS
50.0000 mg | ORAL_CAPSULE | Freq: Three times a day (TID) | ORAL | Status: DC
  Administered 2016-08-19 – 2016-08-20 (×3): 50 mg via ORAL
  Filled 2016-08-19 (×3): qty 2

## 2016-08-19 NOTE — Progress Notes (Signed)
Skin assessment on admission was performed by Clinical research associatewriter with assistance from ArnoldLaguanda, MHT.  Belongings check by EritreaLaquanda, MHT and Malinda,Security was done and no contraband was found.  Two bras with wires were sent home with Patient's partner.
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
## 2016-08-19 NOTE — Progress Notes (Signed)
Patient admitted to the unit at 2000 [facility] for treatment of SI by OD of "about 50 Wellbutrin last Sunday".  Skin assessment completed and revealed reddened skin on upper arms and face which Patient states is a "sun burn".  She has multiple Tatoo's but otherwise assessment is unremarkable. No contraband was found.  Patient states she is here for "overdose, but I did not mean to hurt myself and I did not know what I was doing".  Patient denies current SI and contracts for safety.  She denies HI/AVH.  She is observed to be mildly anxious and verbalizes "not needing to be admitted here".  She smokes a pack of cigarettes daily and is wearing a Nicotine patch.  She is vague about alcohol intake: "some days I have 2-3 beers and some days I have a whole case".  She admitted that on the day Sunday, "I had been drinking a lot".  She was tearful when talking about the loss of her Father in April. She was oriented to the unit and had a snack.  Gait is observed to be steady. Safety is maintained.

## 2016-08-19 NOTE — Tx Team (Signed)
Initial Treatment Plan 08/19/2016 1:35 AM Crystal Castillo [id number]:[id number]    PATIENT STRESSORS: Loss of Father in April 2018 Substance abuse   PATIENT STRENGTHS: Capable of independent living Motivation for treatment/growth   PATIENT IDENTIFIED PROBLEMS:                      DISCHARGE CRITERIA:  Improved stabilization in mood, thinking, and/or behavior Motivation to continue treatment in a less acute level of care Reduction of life-threatening or endangering symptoms to within safe limits  PRELIMINARY DISCHARGE PLAN: Attend aftercare/continuing care group Return to previous living arrangement  PATIENT/FAMILY INVOLVEMENT: This treatment plan has been presented to and reviewed with the patient, Crystal Castillo.  The patient has been given the opportunity to ask questions and make suggestions.  Milon ScoreValinda Kristine Tiley, RN 08/19/2016, 1:35 AM

## 2016-08-19 NOTE — Plan of Care (Signed)
Problem: Education: Goal: Emotional status will improve Outcome: Progressing Attending unit programing  For verbalization of feelings

## 2016-08-19 NOTE — Tx Team (Signed)
Interdisciplinary Treatment and Diagnostic Plan Update  08/19/2016 Time of Session: 11:00 AM Crystal Castillo MRN: [id number]  Principal Diagnosis: MDD (major depressive disorder)  Secondary Diagnoses: Principal Problem:   MDD (major depressive disorder) Active Problems:   Drug overdose, intentional ([name])   Diabetes mellitus type II, non insulin dependent ([name])   Alcohol use disorder, moderate, dependence ([name])   Opioid use disorder, moderate, dependence ([name])   Tobacco use disorder   Borderline personality disorder   Benzodiazepine withdrawal ([name])   Current Medications:  Current Facility-Administered Medications  Medication Dose Route Frequency Provider Last Rate Last Dose  . acetaminophen (TYLENOL) tablet 1,000 mg  1,000 mg Oral Q6H PRN Jimmy Footman, MD      . alum & mag hydroxide-simeth (MAALOX/MYLANTA) 200-200-20 MG/5ML suspension 30 mL  30 mL Oral Q4H PRN Jimmy Footman, MD      . chlordiazePOXIDE (LIBRIUM) capsule 50 mg  50 mg Oral TID Jimmy Footman, MD      . insulin aspart (novoLOG) injection 0-5 Units  0-5 Units Subcutaneous QHS Jimmy Footman, MD      . insulin aspart (novoLOG) injection 0-9 Units  0-9 Units Subcutaneous TID WC Hernandez-Gonzalez, Sue Lush, MD      . magnesium hydroxide (MILK OF MAGNESIA) suspension 30 mL  30 mL Oral Daily PRN Jimmy Footman, MD      . metroNIDAZOLE (FLAGYL) tablet 500 mg  500 mg Oral Q12H Jimmy Footman, MD   500 mg at 08/19/16 0953  . nicotine (NICODERM CQ - dosed in mg/24 hours) patch 14 mg  14 mg Transdermal Daily Jimmy Footman, MD   14 mg at 08/19/16 7829   PTA Medications: No prescriptions prior to admission.    Patient Stressors: Loss of Father in April 2018 Substance abuse  Patient Strengths: Capable of independent living Motivation for treatment/growth  Treatment Modalities: Medication Management, Group therapy, Case management,  1 to 1  session with clinician, Psychoeducation, Recreational therapy.   Physician Treatment Plan for Primary Diagnosis: MDD (major depressive disorder) Long Term Goal(s): Improvement in symptoms so as ready for discharge Improvement in symptoms so as ready for discharge   Short Term Goals: Ability to identify changes in lifestyle to reduce recurrence of condition will improve Ability to verbalize feelings will improve Ability to identify and develop effective coping behaviors will improve Ability to identify triggers associated with substance abuse/mental health issues will improve Ability to demonstrate self-control will improve  Medication Management: Evaluate patient's response, side effects, and tolerance of medication regimen.  Therapeutic Interventions: 1 to 1 sessions, Unit Group sessions and Medication administration.  Evaluation of Outcomes: Progressing  Physician Treatment Plan for Secondary Diagnosis: Principal Problem:   MDD (major depressive disorder) Active Problems:   Drug overdose, intentional ([name])   Diabetes mellitus type II, non insulin dependent ([name])   Alcohol use disorder, moderate, dependence ([name])   Opioid use disorder, moderate, dependence ([name])   Tobacco use disorder   Borderline personality disorder   Benzodiazepine withdrawal ([name])  Long Term Goal(s): Improvement in symptoms so as ready for discharge Improvement in symptoms so as ready for discharge   Short Term Goals: Ability to identify changes in lifestyle to reduce recurrence of condition will improve Ability to verbalize feelings will improve Ability to identify and develop effective coping behaviors will improve Ability to identify triggers associated with substance abuse/mental health issues will improve Ability to demonstrate self-control will improve     Medication Management: Evaluate patient's response, side effects, and tolerance of medication regimen.  Therapeutic Interventions: 1 to 1  sessions, Unit Group sessions and Medication administration.  Evaluation of Outcomes: Progressing   RN Treatment Plan for Primary Diagnosis: MDD (major depressive disorder) Long Term Goal(s): Knowledge of disease and therapeutic regimen to maintain health will improve  Short Term Goals: Ability to verbalize feelings will improve, Ability to identify and develop effective coping behaviors will improve and Compliance with prescribed medications will improve  Medication Management: RN will administer medications as ordered by provider, will assess and evaluate patient's response and provide education to patient for prescribed medication. RN will report any adverse and/or side effects to prescribing provider.  Therapeutic Interventions: 1 on 1 counseling sessions, Psychoeducation, Medication administration, Evaluate responses to treatment, Monitor vital signs and CBGs as ordered, Perform/monitor CIWA, COWS, AIMS and Fall Risk screenings as ordered, Perform wound care treatments as ordered.  Evaluation of Outcomes: Progressing   LCSW Treatment Plan for Primary Diagnosis: MDD (major depressive disorder) Long Term Goal(s): Safe transition to appropriate next level of care at discharge, Engage patient in therapeutic group addressing interpersonal concerns.  Short Term Goals: Engage patient in aftercare planning with referrals and resources, Increase social support, Increase emotional regulation and Identify triggers associated with mental health/substance abuse issues  Therapeutic Interventions: Assess for all discharge needs, 1 to 1 time with Social worker, Explore available resources and support systems, Assess for adequacy in community support network, Educate family and significant other(s) on suicide prevention, Complete Psychosocial Assessment, Interpersonal group therapy.  Evaluation of Outcomes: Progressing   Progress in Treatment: Attending groups: Yes. Participating in groups:  Yes. Taking medication as prescribed: Yes. Toleration medication: Yes. Family/Significant other contact made: No, will contact:  Pt refused family contact. Patient understands diagnosis: Yes. Discussing patient identified problems/goals with staff: Yes. Medical problems stabilized or resolved: Yes. Denies suicidal/homicidal ideation: Yes. Issues/concerns per patient self-inventory: No.  New problem(s) identified: No, Describe:  None.  New Short Term/Long Term Goal(s): Patient stated that her goal is to learn more appropriate coping mechanisms.  Discharge Plan or Barriers: Patient will return home and follow-up with outpatient providers.  Reason for Continuation of Hospitalization: Anxiety Depression Other; describe substance abuse   Estimated Length of Stay: 3-5 days   Attendees: Patient: Crystal Castillo  08/19/2016 11:12 AM  Physician: Dr. Radene JourneyAndrea Hernandez, MD  08/19/2016 11:12 AM  Nursing: Hulan AmatoGwen Farrish, RN  08/19/2016 11:12 AM  RN Care Manager: 08/19/2016 11:12 AM  Social Worker: Hampton AbbotKadijah Burma Ketcher, MSW, LCSW-A 08/19/2016 11:12 AM  Recreational Therapist: Princella IonElizabeth Greene, LRT, CTRS  08/19/2016 11:12 AM  Other:  08/19/2016 11:12 AM  Other:  08/19/2016 11:12 AM  Other: 08/19/2016 11:12 AM    Scribe for Treatment Team: Lynden OxfordKadijah R Tito Ausmus, LCSWA 08/19/2016 11:12 AM

## 2016-08-19 NOTE — [facility] Suicide Risk Assessment (Signed)
[facility] INPATIENT:  Family/Significant Other Suicide Prevention Education  Suicide Prevention Education:  Patient Refusal for Family/Significant Other Suicide Prevention Education: The patient Crystal Castillo has refused to provide written consent for family/significant other to be provided Family/Significant Other Suicide Prevention Education during admission and/or prior to discharge.  Physician notified.  Lynden OxfordKadijah R Jahziah Simonin, MSW, LCSW-A 08/19/2016, 10:54 AM

## 2016-08-19 NOTE — Plan of Care (Signed)
Problem: Education: Goal: Knowledge of [place] Education information/materials will improve Outcome: Progressing Patient able to verbalize understanding of information  receive
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
## 2016-08-19 NOTE — BHH Group Notes (Signed)
B[facility] Group Notes:  (Nursing/MHT/Case Management/Adjunct)  Date:  08/19/2016  Time:  11:19 PM  Type of Therapy:  Psychoeducational Skills  Participation Level:  Active  Participation Quality:  Attentive and Sharing  Affect:  Appropriate  Cognitive:  Alert and Oriented  Insight:  Good  Engagement in Group:  Developing/Improving and Engaged  Modes of Intervention:  Discussion and Exploration  Summary of Progress/Problems:  Foy GuadalajaraJasmine R Kimberly Nieland 08/19/2016, 11:19 PM

## 2016-08-19 NOTE — [facility] Suicide Risk Assessment (Signed)
[facility]   Nursing information obtained from:    Demographic factors:    Current Mental Status:    Loss Factors:    Historical Factors:    Risk Reduction Factors:     Total Time spent with patient: 1 hour Principal Problem: MDD (major depressive disorder) Diagnosis:   Patient Active Problem List   Diagnosis Date Noted  . Alcohol use disorder, moderate, dependence ([name]) [F10.20] 08/19/2016  . Opioid use disorder, moderate, dependence ([name]) [F11.20] 08/19/2016  . Tobacco use disorder [F17.200] 08/19/2016  . Borderline personality disorder [F60.3] 08/19/2016  . Benzodiazepine withdrawal ([name]) [F13.239] 08/19/2016  . MDD (major depressive disorder) [F32.9] 08/18/2016  . Drug overdose, intentional ([name]) [T50.902A] 08/16/2016  . Diabetes mellitus type II, non insulin dependent ([name]) [E11.9] 08/16/2016   Subjective Data:   Continued Clinical Symptoms:  Alcohol Use Disorder Identification Test Final Score (AUDIT): 9 The "Alcohol Use Disorders Identification Test", Guidelines for Use in Primary Care, Second Edition.  World Science writerHealth Organization [facility](WHO). Score between 0-7:  no or low risk or alcohol related problems. Score between 8-15:  moderate risk of alcohol related problems. Score between 16-19:  high risk of alcohol related problems. Score 20 or above:  warrants further diagnostic evaluation for alcohol dependence and treatment.   CLINICAL FACTORS:   Alcohol/Substance Abuse/Dependencies Previous Psychiatric Diagnoses and Treatments    Psychiatric Specialty Exam: Physical Exam  ROS  Blood pressure 117/71, pulse 88, temperature 98.3 F (36.8 C), temperature source Oral, resp. rate 18, height 5\' 7"  (1.702 m), weight 97.1 kg (214 lb), last menstrual period 07/20/2015.Body mass index is 33.52 kg/m.                                                    Sleep:  Number of Hours: 6.3      COGNITIVE FEATURES THAT CONTRIBUTE TO RISK:   Closed-mindedness    SUICIDE RISK:   Moderate:  Frequent suicidal ideation with limited intensity, and duration, some specificity in terms of plans, no associated intent, good self-control, limited dysphoria/symptomatology, some risk factors present, and identifiable protective factors, including available and accessible social support.  [facility]  I certify that inpatient services furnished can reasonably be expected to improve the patient's condition.   Jimmy FootmanHernandez-Gonzalez,  Thurza Kwiecinski, MD 08/19/2016, 9:00 AM

## 2016-08-19 NOTE — Progress Notes (Signed)
D: Patient stated slept fair last night .Stated appetite is good and energy level  Is normal. Stated concentration is good . Stated on Depression scale 5 , hopeless 2 and anxiety 8 .( low 0-10 high) Denies suicidal  homicidal ideations  .  No auditory hallucinations  No pain concerns . Appropriate ADL'S. Interacting with peers and staff.  Patient informed of having a UTI and started on Macrobid  A: Encourage patient participation with unit programming . Instruction  Given on  Medication , verbalize understanding. R: Voice no other concerns. Staff continue to monitor

## 2016-08-20 LAB — HEMOGLOBIN A1C
Hgb A1c MFr Bld: 5.5 % (ref 4.8–5.6)
Mean Plasma Glucose: 111 mg/dL

## 2016-08-20 LAB — URINE CULTURE
Culture: <10000 — AB
Special Requests: NORMAL

## 2016-08-20 MED ORDER — CHLORDIAZEPOXIDE HCL 25 MG PO CAPS
25.0000 mg | ORAL_CAPSULE | Freq: Four times a day (QID) | ORAL | Status: DC
  Administered 2016-08-20 – 2016-08-21 (×4): 25 mg via ORAL
  Filled 2016-08-20 (×4): qty 1

## 2016-08-20 MED ORDER — PHENAZOPYRIDINE HCL 100 MG PO TABS
100.0000 mg | ORAL_TABLET | Freq: Three times a day (TID) | ORAL | Status: DC
  Administered 2016-08-20 – 2016-08-21 (×3): 100 mg via ORAL
  Filled 2016-08-20 (×4): qty 1

## 2016-08-20 NOTE — Plan of Care (Signed)
Problem: Activity: Goal: Sleeping patterns will improve Outcome: Progressing Patient slept for Estimated Hours of 7.15; q15 minutes safety round maintained, no injury or falls during this shift.    

## 2016-08-20 NOTE — Progress Notes (Signed)
Patient ID: Crystal LankAdria L Castillo, female   DOB: 10/02/1990, 26 y.o.   MRN: [id number] A&Ox3, c/o HA 6/10, Tylenol 1,000 mg (2 tablets) given at 1955; verbalized reduced pain 2/10 on re-assessment. Pleasant, polite, ashame of OD in attempted suicide, tearful but quickly regain composure, "I have a niece now, I will never try that again .." Interacting well with peers, appropriate, neat, hopeful and optimistic.

## 2016-08-20 NOTE — Progress Notes (Signed)
[facility] MD Progress Note  09/10/2016 11:40 AM Crystal Castillo  MRN:  [id number] Subjective:  Patient is a 26 year old female who presented to the emergency department at [facility] on July 1 after overdosing on Wellbutrin (>60 tabs)  At initial presentation patient was tachycardic, confused and was hallucinating. Ethanol level is elevated 168, acetaminophen level was undetectable, and salicylates are undetectable. Urine toxicology was positive for benzodiazepines and cannabis.  Pt was seen at [facility] ER on 6/5 after she was found with decreased respiratory rate in the parking lot of a pharmacy. Patient had ingested heroin, alcohol and klonopin.  Stated this was not a suicidal attempt.   Per records she is f/u by Dr Cyndia Bent primary care for MDD and anxiety. Prescribed wellbutrin, topamax and klonopin.   Today the patient states this was not a suicidal attempt. Patient stated that she had been drinking that day with friends and she thought "screw it, I'm going to take some pills".   Patient says she has a lot of stressors currently. Her father passed away in 06-16-2022 of this year, she has been unemployed since Sep 2017.  Apparently she was fired after having a fracturing her ankle ankle. Peer her family has been helping with the bills and she also inherited some money after her father passed away.   09/11/2022 patient continues to minimize her substance use and frequent overdoses. She says she feels now fine and would rather be discharge than having to stay here any longer. She is upset because tomorrow is her birthday. Patient says she is no longer interested in going to an intensive outpatient substance abuse program. Patient feels she is not addicted to any substances, feels she can control her use and stop at any time.  Denies suicidality, homicidality or auditory or visual hallucinations. Denies side effects from medications. As far as physical complaints she continues to complain of lower  abdominal pain which is secondary to UTI. She said that she urinated blood this morning.   Per nursing: A&Ox3, c/o HA 6/10, Tylenol 1,000 mg (2 tablets) given at 1955; verbalized reduced pain 2/10 on re-assessment. Pleasant, polite, ashame of OD in attempted suicide, tearful but quickly regain composure, "I have a niece now, I will never try that again .." Interacting well with peers, appropriate, neat, hopeful and optimistic.  Principal Problem: MDD (major depressive disorder) Diagnosis:   Patient Active Problem List   Diagnosis Date Noted  . Alcohol use disorder, moderate, dependence ([name]) [F10.20] 08/19/2016  . Opioid use disorder, moderate, dependence ([name]) [F11.20] 08/19/2016  . Tobacco use disorder [F17.200] 08/19/2016  . Borderline personality disorder [F60.3] 08/19/2016  . Benzodiazepine withdrawal ([name]) [F13.239] 08/19/2016  . MDD (major depressive disorder) [F32.9] 08/18/2016  . Drug overdose, intentional ([name]) [T50.902A] 08/16/2016  . Diabetes mellitus type II, non insulin dependent ([name]) [E11.9] 08/16/2016   Total Time spent with patient: 30 minutes  Past Psychiatric History: Patient has history of depression, eating disorder, obesity, alcohol abuse versus intoxication and history of previous psychiatric hospitalization [place].  From [age] was hospitalized >10 times for OD on her medications--suicidal attempts.  Long history of self injury (cutting)  At age 58 she was hospitalized for 3 months due to eating d/o.    Past Medical History:  Past Medical History:  Diagnosis Date  . Anorexia nervosa with bulimia    just finished treatment in August 2012 for this  . Anxiety   . Bartholin's cyst   . Depression   . Diabetes mellitus  not currently treated for this. Pt states she was told she had DM, but lost @  150 lbs in 9 months.  . Diabetes mellitus without complication ([name])   . Obesity   . PTSD (post-traumatic stress disorder)     Past Surgical  History:  Procedure Laterality Date  . TONSILLECTOMY    . WISDOM TOOTH EXTRACTION     Family History:  Family History  Problem Relation Age of Onset  . Diabetes Mother    Family Psychiatric  History: father was an alcoholic and had bipolar d/o. Her mother had an eating d/o.  Mother died when pt was 89 y/o.   Social History: Patient currently lives with her boyfriend. Her highest level of education is eighth grade. History  Alcohol Use  . Yes    Comment: occasional drinker - 2 or 3 times a week     History  Drug Use No    Social History   Social History  . Marital status: Single    Spouse name: N/A  . Number of children: N/A  . Years of education: N/A   Social History Main Topics  . Smoking status: Former Smoker    Packs/day: 0.50    Types: Cigarettes  . Smokeless tobacco: Never Used     Comment: rarely smokes  . Alcohol use Yes     Comment: occasional drinker - 2 or 3 times a week  . Drug use: No  . Sexual activity: Yes    Birth control/ protection: Injection   Other Topics Concern  . None   Social History Narrative   ** Merged History Encounter **        Current Medications: Current Facility-Administered Medications  Medication Dose Route Frequency Provider Last Rate Last Dose  . acetaminophen (TYLENOL) tablet 1,000 mg  1,000 mg Oral Q6H PRN Jimmy FootmanHernandez-Gonzalez, Triston Lisanti, MD   1,000 mg at 08/20/16 0734  . alum & mag hydroxide-simeth (MAALOX/MYLANTA) 200-200-20 MG/5ML suspension 30 mL  30 mL Oral Q4H PRN Jimmy FootmanHernandez-Gonzalez, Muntaha Vermette, MD      . chlordiazePOXIDE (LIBRIUM) capsule 25 mg  25 mg Oral QID Jimmy FootmanHernandez-Gonzalez, Avaiah Stempel, MD      . magnesium hydroxide (MILK OF MAGNESIA) suspension 30 mL  30 mL Oral Daily PRN Jimmy FootmanHernandez-Gonzalez, Omni Dunsworth, MD      . metroNIDAZOLE (FLAGYL) tablet 500 mg  500 mg Oral Q12H Jimmy FootmanHernandez-Gonzalez, Jonah Gingras, MD   500 mg at 08/20/16 0735  . nicotine (NICODERM CQ - dosed in mg/24 hours) patch 14 mg  14 mg Transdermal Daily Jimmy FootmanHernandez-Gonzalez,  Valary Manahan, MD   14 mg at 08/20/16 0735  . nitrofurantoin (macrocrystal-monohydrate) (MACROBID) capsule 100 mg  100 mg Oral Q12H Jimmy FootmanHernandez-Gonzalez, Rechy Bost, MD   100 mg at 08/20/16 0734  . phenazopyridine (PYRIDIUM) tablet 100 mg  100 mg Oral TID WC Jimmy FootmanHernandez-Gonzalez, Arnaldo Heffron, MD        Lab Results:  Results for orders placed or performed during the hospital encounter of 08/18/16 (from the past 48 hour(s))  Glucose, capillary     Status: None   Collection Time: 08/18/16  9:28 PM  Result Value Ref Range   Glucose-Capillary 93 65 - 99 mg/dL  Glucose, capillary     Status: Abnormal   Collection Time: 08/19/16  6:49 AM  Result Value Ref Range   Glucose-Capillary 105 (H) 65 - 99 mg/dL  Hemoglobin [id number]     Status: None   Collection Time: 08/19/16  8:58 AM  Result Value Ref Range   Hgb A1c MFr Bld 5.5 4.8 -  5.6 %    Comment: (NOTE)         Pre-diabetes: 5.7 - 6.4         Diabetes: >6.4         Glycemic control for adults with diabetes: <7.0    Mean Plasma Glucose 111 mg/dL    Comment: (NOTE) Performed At: [facility] [address] [place], [place] [id number] Mila Homer MD WU:[id number]   TSH     Status: None   Collection Time: 08/19/16  8:58 AM  Result Value Ref Range   TSH 1.569 0.350 - 4.500 uIU/mL    Comment: Performed by a 3rd Generation assay with a functional sensitivity of <=0.01 uIU/mL.  Urinalysis, Complete w Microscopic     Status: Abnormal   Collection Time: 08/19/16  9:17 AM  Result Value Ref Range   Color, Urine YELLOW (A) YELLOW   APPearance HAZY (A) CLEAR   Specific Gravity, Urine 1.020 1.005 - 1.030   pH 5.0 5.0 - 8.0   Glucose, UA NEGATIVE NEGATIVE mg/dL   Hgb urine dipstick NEGATIVE NEGATIVE   Bilirubin Urine NEGATIVE NEGATIVE   Ketones, ur 5 (A) NEGATIVE mg/dL   Protein, ur NEGATIVE NEGATIVE mg/dL   Nitrite NEGATIVE NEGATIVE   Leukocytes, UA LARGE (A) NEGATIVE   RBC / HPF 6-30 0 - 5 RBC/hpf   WBC, UA 6-30 0 - 5 WBC/hpf   Bacteria, UA RARE (A)  NONE SEEN   Squamous Epithelial / LPF 0-5 (A) NONE SEEN   Mucous PRESENT   Glucose, capillary     Status: None   Collection Time: 08/19/16 11:36 AM  Result Value Ref Range   Glucose-Capillary 94 65 - 99 mg/dL   Comment 1 Notify RN   Glucose, capillary     Status: None   Collection Time: 08/19/16  4:30 PM  Result Value Ref Range   Glucose-Capillary 89 65 - 99 mg/dL    Blood Alcohol level:  Lab Results  Component Value Date   ETH <5 08/17/2016   ETH 168 (H) 08/16/2016    Metabolic Disorder Labs: Lab Results  Component Value Date   HGBA1C 5.5 08/19/2016   MPG 111 08/19/2016   No results found for: PROLACTIN No results found for: CHOL, TRIG, HDL, CHOLHDL, VLDL, LDLCALC  Physical Findings: AIMS:  , ,  ,  ,    CIWA:  CIWA-Ar Total: 3 COWS:     Musculoskeletal: Strength & Muscle Tone: within normal limits Gait & Station: normal Patient leans: N/A  Psychiatric Specialty Exam: Physical Exam  Constitutional: She is oriented to person, place, and time. She appears well-developed and well-nourished.  HENT:  Head: Normocephalic and atraumatic.  Eyes: Conjunctivae and EOM are normal.  Neck: Normal range of motion.  Respiratory: Effort normal.  Musculoskeletal: Normal range of motion.  Neurological: She is alert and oriented to person, place, and time.    Review of Systems  Constitutional: Negative.   HENT: Negative.   Eyes: Negative.   Respiratory: Negative.   Cardiovascular: Negative.   Gastrointestinal: Negative.   Genitourinary: Positive for dysuria and flank pain.  Skin: Negative.   Neurological: Negative.   Endo/Heme/Allergies: Negative.   Psychiatric/Behavioral: Positive for depression and substance abuse. Negative for hallucinations, memory loss and suicidal ideas. The patient is not nervous/anxious and does not have insomnia.     Blood pressure 111/65, pulse 87, temperature 98.1 F (36.7 C), resp. rate 18, height 5\' 7"  (1.702 m), weight 97.1 kg (214 lb),  last menstrual  period 07/20/2015.Body mass index is 33.52 kg/m.  General Appearance: Fairly Groomed  Eye Contact:  Good  Speech:  Clear and Coherent  Volume:  Normal  Mood:  Dysphoric  Affect:  Blunt  Thought Process:  Linear and Descriptions of Associations: Intact  Orientation:  Full (Time, Place, and Person)  Thought Content:  Hallucinations: None  Suicidal Thoughts:  No  Homicidal Thoughts:  No  Memory:  Immediate;   Good Recent;   Good Remote;   Good  Judgement:  Poor  Insight:  Lacking  Psychomotor Activity:  Normal  Concentration:  Concentration: Good and Attention Span: Good  Recall:  Good  Fund of Knowledge:  Good  Language:  Good  Akathisia:  No  Handed:    AIMS (if indicated):     Assets:  Manufacturing systems engineer Social Support  ADL's:  Intact  Cognition:  WNL  Sleep:  Number of Hours: 7.15     Treatment Plan Summary: Daily contact with patient to assess and evaluate symptoms and progress in treatment and Medication management   26 year old female with long history of multiple suicidal attempts by overdose, substance abuse and likely borderline personality disorder  MDD: Patient has been tried on a multitude of antidepressants over the years. This time due to her recent overdose on Wellbutrin that cause QTC prolongation I will prefer not to start any medications. Patient is having significant issues with grief with the death of her father. Patient said that was unexpected and they were very close.  PTSD: For right now patient will not be started on any medications due to physician overdose  Borderline personality disorder the patient will be referred to intensive outpatient treatment.  Polysubstance abuse: Patient has minimal insight into the severity of her addiction. She feels she can stop drinking at this time and she also feels she is not addicted to opiates. My recommendation for this patient is a referral for intensive outpatient substance abuse  treatment.  Benzodiazepine withdrawal: Patient says she has been prescribed with Ativan 3 mg a day by her primary care provider for the last year. She takes his medication daily. She will be started on a Librium taper. Today I have decreased her Librium to 25 mg 4 times a day  DM: Does not appear to have diabetes. Hemoglobin A1c is 5.5.  Nicotine use: Patient has orders for a nicotine patch   UTI: UA positive for UTI, pending urine culture. Patient having significant pain and discomfort will start pyridium 100 mg 3 times a day  Rule out bacterial vaginosis: Patient complains of significant discharge or discomfort. She was diagnosed with Trichomonas recently. I will start her on metronidazole  Labs: HbA1c ( 5.5 )TSH (wnl   Jimmy Footman, MD 08/20/2016, 11:40 AM

## 2016-08-20 NOTE — Progress Notes (Signed)
Recreation Therapy Notes  Date: 07.05.18 Time: 1:00 pm Location: Craft Room  Group Topic: Leisure Education  Goal Area(s) Addresses:  Patient will identify activities for each letter of the alphabet. Patient will verbalize ability to integrate positive leisure into life post d/c. Patient will verbalize ability to use leisure as a Associate Professorcoping skill.  Behavioral Response: Attentive, Interactive  Intervention: Leisure Alphabet  Activity: Patients were given a Leisure Information systems managerAlphabet worksheet and were instructed to write healthy leisure activities for each letter of the alphabet.  Education: LRT educated patients on what they need to participate in leisure.  Education Outcome: Acknowledges education/In group clarification offered   Clinical Observations/Feedback: Patient wrote healthy leisure activities. Patient contributed to group discussion by stating healthy leisure activities, and how she can integrate leisure into her life.  Jacquelynn CreeGreene,Susanna Benge M, LRT/CTRS 08/20/2016 2:08 PM

## 2016-08-20 NOTE — [facility] Group Notes (Signed)
[facility] LCSW Group Therapy  08/20/2016 1:56 PM  Type of Therapy:  Group Therapy  Participation Level:  Active  Participation Quality:  Attentive  Affect:  Appropriate and Flat  Cognitive:  Alert  Insight:  Improving  Engagement in Therapy:  Improving  Modes of Intervention:  Activity, Discussion, Education, Problem-solving, Reality Testing, Socialization and Support  Summary of Progress/Problems: Balance in life: Patients will discuss the concept of balance and how it looks and feels to be unbalanced. Pt will identify areas in their life that is unbalanced and ways to become more balanced. They discussed what aspects in their lives has influenced their self care. Patients also discussed self care in the areas of self regulation/control, hygiene/appearance, sleep/relaxation, healthy leisure, healthy eating habits, exercise, inner peace/spirituality, self improvement, sobriety, and health management. They were challenged to identify changes that are needed in order to improve self care.  Dom Haverland G. Garnette CzechSampson MSW, LCSWA 08/20/2016, 1:56 PM

## 2016-08-20 NOTE — Progress Notes (Signed)
Patient pleasant and cooperative with care. Denies SI, HI, AVH. Pt reports OD was not an actual suicide attempt. Could not explain what it was. Pt having extreme discomfort from UTI, md notified. Tylenol given as well as new order for pyridium with effective results. Reports sleeping fair last pm with no medication. Rates depression at 3, hopelessness at 2 and anxiety at 6. Encouragement and support offered. Safety checks maintained. Medications given as prescribed. Pt receptive and remains safe on unit with q 15 min checks.

## 2016-08-20 NOTE — [facility] Group Notes (Signed)
[facility] LCSW Group Therapy Note  Type of Therapy and Topic:  Group Therapy:  Goals Group: SMART Goals  Participation Level:  Patient attended group on this date and minimally participated in the group discussion.   Description of Group:   The purpose of a daily goals group is to assist and guide patients in setting recovery/wellness-related goals.  The objective is to set goals as they relate to the crisis in which they were admitted. Patients will be using SMART goal modalities to set measurable goals.  Characteristics of realistic goals will be discussed and patients will be assisted in setting and processing how one will reach their goal. Facilitator will also assist patients in applying interventions and coping skills learned in psycho-education groups to the SMART goal and process how one will achieve defined goal.  Therapeutic Goals: -Patients will develop and document one goal related to or their crisis in which brought them into treatment. -Patients will be guided by LCSW using SMART goal setting modality in how to set a measurable, attainable, realistic and time sensitive goal.  -Patients will process barriers in reaching goal. -Patients will process interventions in how to overcome and successful in reaching goal.   Summary of Patient Progress:  Patient Goal:  "I want to focus on developing my aftercare plan".   Therapeutic Modalities:   Motivational Interviewing Engineer, manufacturing systemsCognitive Behavioral Therapy Crisis Intervention Model SMART goals setting  Alcario Tinkey G. Garnette CzechSampson MSW, LCSWA 08/20/2016 1:56 PM

## 2016-08-20 NOTE — Progress Notes (Signed)
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; life; finances; being here)  Coping Skills:   Substance Abuse, Avoidance, Self-Injury, Exercise, Art/Dance, Talking, Music  Personal Challenges: Communication, Concentration, Decision-Making, Self-Esteem/Confidence, Stress Management, Substance Abuse, Time Management, Trusting Others  Leisure Interests (2+):  Sports - Swimming, Individual - Other (Comment) (Spend time with friends)  Awareness of Community Resources:  Yes  Community Resources:  Park, Other (Comment) Field seismologist([place])  Current Use: Yes  If no, Barriers?:    Patient Strengths:  Friendly, kind-hearted  Patient Identified Areas of Improvement:  Lots  Current Recreation Participation:  Spend time with friends, camping  Patient Goal for Hospitalization:  To work on Physicist, medicalacceptance and her issues and manage feelings better  [place]  [place]   [place] (including self-harm):  No  Current HI:  No  Consent to Intern Participation: N/A   Jacquelynn CreeGreene,[name] M, LRT/CTRS 08/20/2016, 4:09 PM

## 2016-08-21 LAB — CHLAMYDIA/NGC RT PCR (ARMC ONLY)
CHLAMYDIA TR: NOT DETECTED
N GONORRHOEAE: NOT DETECTED

## 2016-08-21 MED ORDER — NITROFURANTOIN MONOHYD MACRO 100 MG PO CAPS: 100.0000 mg | ORAL_CAPSULE | Freq: Two times a day (BID) | ORAL | Status: DC

## 2016-08-21 MED ORDER — CHLORDIAZEPOXIDE HCL 25 MG PO CAPS
25.0000 mg | ORAL_CAPSULE | Freq: Three times a day (TID) | ORAL | Status: DC
  Administered 2016-08-21 – 2016-08-24 (×10): 25 mg via ORAL
  Filled 2016-08-21 (×10): qty 1

## 2016-08-21 MED ORDER — FLUOXETINE HCL 10 MG PO CAPS
10.0000 mg | ORAL_CAPSULE | Freq: Every day | ORAL | Status: DC
  Administered 2016-08-21 – 2016-08-24 (×4): 10 mg via ORAL
  Filled 2016-08-21 (×4): qty 1

## 2016-08-21 NOTE — Plan of Care (Signed)
Problem: [facility] Participation in Recreation Therapeutic Interventions Goal: STG-Patient will demonstrate improved self esteem by identif STG: Self-Esteem - Within 4 treatment sessions, patient will verbalize at least 5 positive affirmation statements in each of 2 treatment sessions to increase self-esteem.  Outcome: Progressing Treatment Session 1; Completed 1 out of 2: At approximately 2:40 pm, LRT met with patient in consultation room. Patient verbalized 5 positive affirmation statements. Patient reported it felt "fine". LRT encouraged patient to continue saying positive affirmation statements.  Leonette Monarch, LRT/CTRS 07.06.18 3:14 pm Goal: STG-Patient will identify at least five coping skills for ** STG: Coping Skills - Within 4 treatment sessions, patient will verbalize at least 5 coping skills for substance abuse in each of 2 treatment sessions to decrease substance abuse.  Outcome: Progressing Treatment Session 1; Completed 1 out of 2: At approximately 2:40 pm, LRT met with patient in consultation room. Patient verbalized 5 coping skills for substance abuse. LRT educated patient on leisure and why it is important to implement it into her schedule. LRT educated and provided patient with blank schedules to help her plan her day and try to avoid using substances.  Leonette Monarch, LRT/CTRS 07.06.18 3:16 pm

## 2016-08-21 NOTE — Progress Notes (Signed)
[facility] MD Progress Note  08/21/2016 9:39 AM Crystal Castillo  MRN:  [id number] Subjective:  Patient is a 26 year old female who presented to the emergency department at [facility] on July 1 after overdosing on Wellbutrin (>60 tabs)  At initial presentation patient was tachycardic, confused and was hallucinating. Ethanol level is elevated 168, acetaminophen level was undetectable, and salicylates are undetectable. Urine toxicology was positive for benzodiazepines and cannabis.  Pt was seen at [facility] ER on 6/5 after she was found with decreased respiratory rate in the parking lot of a pharmacy. Patient had ingested heroin, alcohol and klonopin.  Stated this was not a suicidal attempt.   Per records she is f/u by Dr Cyndia Bent primary care for MDD and anxiety. Prescribed wellbutrin, topamax and klonopin.   Today the patient states this was not a suicidal attempt. Patient stated that she had been drinking that day with friends and she thought "screw it, I'm going to take some pills".   Patient says she has a lot of stressors currently. Her father passed away in 27-May-2022 of this year, she has been unemployed since Sep 2017.  Apparently she was fired after having a fracturing her ankle ankle. Peer her family has been helping with the bills and she also inherited some money after her father passed away.   2022/08/22 patient continues to minimize her substance use and frequent overdoses. She says she feels now fine and would rather be discharge than having to stay here any longer. She is upset because tomorrow is her birthday. Patient says she is no longer interested in going to an intensive outpatient substance abuse program. Patient feels she is not addicted to any substances, feels she can control her use and stop at any time.  Denies suicidality, homicidality or auditory or visual hallucinations. Denies side effects from medications. As far as physical complaints she continues to complain of lower  abdominal pain which is secondary to UTI. She said that she urinated blood this morning.  7/6 patient reports feeling better. Denies hopelessness, helplessness or suicidal ideation. Denies problems with sleep appetite, energy or concentration. Denies homicidality or hallucinations.   She hopes to be discharged soon. Still very ambivalent about intensive outpatient substance abuse. Continues to minimize the use of substances.  As far as physical complaints continues to complain of discomfort in her urethra and burning sensation with urination. Urine culture negative.  Per nursing:  A&Ox3, no HA, medication compliant, denied SI/HI/AVH, attending group, getting along well with peers; no crying spells, no isolation to room, very well kept in street clothes, logical, appropriate, respect others.  Principal Problem: MDD (major depressive disorder) Diagnosis:   Patient Active Problem List   Diagnosis Date Noted  . Alcohol use disorder, moderate, dependence ([name]) [F10.20] 08/19/2016  . Opioid use disorder, moderate, dependence ([name]) [F11.20] 08/19/2016  . Tobacco use disorder [F17.200] 08/19/2016  . Borderline personality disorder [F60.3] 08/19/2016  . Benzodiazepine withdrawal ([name]) [F13.239] 08/19/2016  . MDD (major depressive disorder) [F32.9] 08/18/2016  . Drug overdose, intentional ([name]) [T50.902A] 08/16/2016  . Diabetes mellitus type II, non insulin dependent ([name]) [E11.9] 08/16/2016   Total Time spent with patient: 30 minutes  Past Psychiatric History: Patient has history of depression, eating disorder, obesity, alcohol abuse versus intoxication and history of previous psychiatric hospitalization [place].  From [age] was hospitalized >10 times for OD on her medications--suicidal attempts.  Long history of self injury (cutting)  At age 48 she was hospitalized for 3 months due to eating  d/o.    Past Medical History:  Past Medical History:  Diagnosis Date  . Anorexia  nervosa with bulimia    just finished treatment in August 2012 for this  . Anxiety   . Bartholin's cyst   . Depression   . Diabetes mellitus    not currently treated for this. Pt states she was told she had DM, but lost @  150 lbs in 9 months.  . Diabetes mellitus without complication ([name])   . Obesity   . PTSD (post-traumatic stress disorder)     Past Surgical History:  Procedure Laterality Date  . TONSILLECTOMY    . WISDOM TOOTH EXTRACTION     Family History:  Family History  Problem Relation Age of Onset  . Diabetes Mother    Family Psychiatric  History: father was an alcoholic and had bipolar d/o. Her mother had an eating d/o.  Mother died when pt was 89 y/o.   Social History: Patient currently lives with her boyfriend. Her highest level of education is eighth grade. History  Alcohol Use  . Yes    Comment: occasional drinker - 2 or 3 times a week     History  Drug Use No    Social History   Social History  . Marital status: Single    Spouse name: N/A  . Number of children: N/A  . Years of education: N/A   Social History Main Topics  . Smoking status: Former Smoker    Packs/day: 0.50    Types: Cigarettes  . Smokeless tobacco: Never Used     Comment: rarely smokes  . Alcohol use Yes     Comment: occasional drinker - 2 or 3 times a week  . Drug use: No  . Sexual activity: Yes    Birth control/ protection: Injection   Other Topics Concern  . None   Social History Narrative   ** Merged History Encounter **        Current Medications: Current Facility-Administered Medications  Medication Dose Route Frequency Provider Last Rate Last Dose  . acetaminophen (TYLENOL) tablet 1,000 mg  1,000 mg Oral Q6H PRN Jimmy FootmanHernandez-Gonzalez, Kinslee Dalpe, MD   1,000 mg at 08/20/16 0734  . alum & mag hydroxide-simeth (MAALOX/MYLANTA) 200-200-20 MG/5ML suspension 30 mL  30 mL Oral Q4H PRN Jimmy FootmanHernandez-Gonzalez, Elan Mcelvain, MD      . chlordiazePOXIDE (LIBRIUM) capsule 25 mg  25 mg Oral TID  Jimmy FootmanHernandez-Gonzalez, Oriyah Lamphear, MD      . magnesium hydroxide (MILK OF MAGNESIA) suspension 30 mL  30 mL Oral Daily PRN Jimmy FootmanHernandez-Gonzalez, Giara Mcgaughey, MD      . metroNIDAZOLE (FLAGYL) tablet 500 mg  500 mg Oral Q12H Jimmy FootmanHernandez-Gonzalez, Lache Dagher, MD   500 mg at 08/21/16 0754  . nicotine (NICODERM CQ - dosed in mg/24 hours) patch 14 mg  14 mg Transdermal Daily Jimmy FootmanHernandez-Gonzalez, Kadan Millstein, MD   14 mg at 08/21/16 0757  . nitrofurantoin (macrocrystal-monohydrate) (MACROBID) capsule 100 mg  100 mg Oral Q12H Jimmy FootmanHernandez-Gonzalez, Shauntae Reitman, MD   100 mg at 08/21/16 0754  . phenazopyridine (PYRIDIUM) tablet 100 mg  100 mg Oral TID WC Jimmy FootmanHernandez-Gonzalez, Jelissa Espiritu, MD   100 mg at 08/21/16 [id number]    Lab Results:  Results for orders placed or performed during the hospital encounter of 08/18/16 (from the past 48 hour(s))  Glucose, capillary     Status: None   Collection Time: 08/19/16 11:36 AM  Result Value Ref Range   Glucose-Capillary 94 65 - 99 mg/dL   Comment 1 Notify RN  Glucose, capillary     Status: None   Collection Time: 08/19/16  4:30 PM  Result Value Ref Range   Glucose-Capillary 89 65 - 99 mg/dL    Blood Alcohol level:  Lab Results  Component Value Date   ETH <5 08/17/2016   ETH 168 (H) 08/16/2016    Metabolic Disorder Labs: Lab Results  Component Value Date   HGBA1C 5.5 08/19/2016   MPG 111 08/19/2016   No results found for: PROLACTIN No results found for: CHOL, TRIG, HDL, CHOLHDL, VLDL, LDLCALC  Physical Findings: AIMS:  , ,  ,  ,    CIWA:  CIWA-Ar Total: 3 COWS:     Musculoskeletal: Strength & Muscle Tone: within normal limits Gait & Station: normal Patient leans: N/A  Psychiatric Specialty Exam: Physical Exam  Constitutional: She is oriented to person, place, and time. She appears well-developed and well-nourished.  HENT:  Head: Normocephalic and atraumatic.  Eyes: Conjunctivae and EOM are normal.  Neck: Normal range of motion.  Respiratory: Effort normal.  Musculoskeletal:  Normal range of motion.  Neurological: She is alert and oriented to person, place, and time.    Review of Systems  Constitutional: Negative.   HENT: Negative.   Eyes: Negative.   Respiratory: Negative.   Cardiovascular: Negative.   Gastrointestinal: Negative.   Genitourinary: Positive for dysuria and flank pain.  Skin: Negative.   Neurological: Negative.   Endo/Heme/Allergies: Negative.   Psychiatric/Behavioral: Positive for depression and substance abuse. Negative for hallucinations, memory loss and suicidal ideas. The patient is not nervous/anxious and does not have insomnia.     Blood pressure 100/60, pulse 90, temperature 98.3 F (36.8 C), temperature source Oral, resp. rate 17, height 5\' 7"  (1.702 m), weight 97.1 kg (214 lb), last menstrual period 07/20/2015, SpO2 98 %.Body mass index is 33.52 kg/m.  General Appearance: Fairly Groomed  Eye Contact:  Good  Speech:  Clear and Coherent  Volume:  Normal  Mood:  Dysphoric  Affect:  Blunt  Thought Process:  Linear and Descriptions of Associations: Intact  Orientation:  Full (Time, Place, and Person)  Thought Content:  Hallucinations: None  Suicidal Thoughts:  No  Homicidal Thoughts:  No  Memory:  Immediate;   Good Recent;   Good Remote;   Good  Judgement:  Poor  Insight:  Lacking  Psychomotor Activity:  Normal  Concentration:  Concentration: Good and Attention Span: Good  Recall:  Good  Fund of Knowledge:  Good  Language:  Good  Akathisia:  No  Handed:    AIMS (if indicated):     Assets:  Manufacturing systems engineer Social Support  ADL's:  Intact  Cognition:  WNL  Sleep:  Number of Hours: 6.45     Treatment Plan Summary: Daily contact with patient to assess and evaluate symptoms and progress in treatment and Medication management   26 year old female with long history of multiple suicidal attempts by overdose, substance abuse and likely borderline personality disorder  MDD: Patient has been tried on a multitude of  antidepressants over the years. We will start low-dose fluoxetine 10 mg a day  PTSD: We'll start the patient on low-dose fluoxetine which was beneficial in the past  Borderline personality disorder the patient will be referred to intensive outpatient treatment.  Polysubstance abuse: Patient has minimal insight into the severity of her addiction. She feels she can stop drinking at this time and she also feels she is not addicted to opiates. My recommendation for this patient is a referral for intensive  outpatient substance abuse treatment---pt is not interested  Benzodiazepine withdrawal: Patient says she has been prescribed with Ativan 3 mg a day by her primary care provider for the last year. She takes his medication daily. She will be started on a Librium taper. Today I have decreased her Librium to 25 mg 3 times a day  DM: Does not appear to have diabetes. Hemoglobin A1c is 5.5.  Nicotine use: Patient has orders for a nicotine patch   UTI: UA positive for UTI but culture neg. Will d/c macrobid  Bacterial vaginosis: Patient complains of significant discharge or discomfort. She was diagnosed with Trichomonas recently. Started on flagyl 500 mg bid   STD: Possible chlamydia versus gonorrhea. We'll order urine test for these.   Labs: HbA1c ( 5.5 )TSH (wnl)  Possible d/c early next week.   Jimmy Footman, MD 08/21/2016, 9:39 AM

## 2016-08-21 NOTE — [facility] Group Notes (Signed)
[facility] LCSW Group Therapy   08/21/2016 9:30 AM   Type of Therapy: Group Therapy   Participation Level: Active   Participation Quality: Attentive, Sharing and Supportive   Affect: Appropriate   Cognitive: Alert and Oriented   Insight: Developing/Improving and Engaged   Engagement in Therapy: Developing/Improving and Engaged   Modes of Intervention: Clarification, Confrontation, Discussion, Education, Exploration, Limit-setting, Orientation, Problem-solving, Rapport Building, Dance movement psychotherapisteality Testing, Socialization and Support   Summary of Progress/Problems: The topic for today was feelings about relapse. Pt discussed what relapse prevention is to them and identified triggers that they are on the path to relapse. Pt processed their feeling towards relapse and was able to relate to peers. Pt discussed coping skills that can be used for relapse prevention. Patient defined mental health relapse and identified triggers for relapse. She stated strategies to reduce relapse such as utilizing emotional regulation skills and therapy.  Hampton AbbotKadijah Tiberius Loftus, MSW, LCSW-A 08/21/2016, 10:19AM

## 2016-08-21 NOTE — Progress Notes (Signed)
Pt to nurse reporting that her vaginal/ureter pain is "better. I think it's just from being in the hospital." In no acute distress. Aware of medication changes. Safety maintained. Will continue to monitor.

## 2016-08-21 NOTE — Progress Notes (Signed)
Recreation Therapy Notes  Date: 07.06.18 Time: 1:00 pm Location: Craft Room  Group Topic: Social Skills  Goal Area(s) Addresses:  Patient will work in teams towards shared goal. Patient will verbalize skills needed to make activity successful. Patient will verbalize benefit of using skills identified to reach post d/c goals.  Behavioral Response: Attentive, Interactive   Intervention: Landing Pad  Activity: Patients were put in teams and given 15 straws and about 2.5 feet of tape and were instructed to build a contraption that would catch and secure a golf ball that was dropped from approximately 4 feet.  Education: LRT educated patients on healthy support systems.  Education Outcome: Acknowledges education/In group clarification offered  Clinical Observations/Feedback: Patient worked with team to build contraption. Patient contributed to group discussion by stating that her team was successful, what skills she used to make them successful, why these skills are important, and what would change for her if she was using these skills post d/c.  Jacquelynn CreeGreene,Shyna Duignan M, LRT/CTRS 08/21/2016 1:43 PM

## 2016-08-21 NOTE — Progress Notes (Signed)
Pt complaining of vaginal pain/pain with urination this morning. Urine sent to lab as ordered. Reports fair sleep last night, good appetite, normal energy, poor concentration. Rates depression 5/10, hopelessness 1/10, anxiety 8/10 (low 0-10 high). Goal today is "to be as positive as possible and try to get up after care" by "try to not let negative thoughts in and talk to s/w." Pleasant, calm, and cooperative. Medication compliant. Denies SI/HI/AVH, but endorses feeling down. Today is the patient's birthday, expects visitors and gifts/cards. Support and encouragment provided. Medications administered as ordered with education. Safety maintained with every 15 minute checks.  Will continue to monitor.

## 2016-08-21 NOTE — Plan of Care (Signed)
Problem: Education: Goal: Knowledge of the prescribed therapeutic regimen will improve Outcome: Progressing Pt voices knowledge of prescribed medications.

## 2016-08-21 NOTE — Plan of Care (Signed)
Problem: Activity: Goal: Sleeping patterns will improve Outcome: Progressing Patient slept for Estimated Hours of 6.45; Precautionary checks every 15 minutes for safety maintained, room free of safety hazards, patient sustains no injury or falls during this shift.    

## 2016-08-21 NOTE — Progress Notes (Signed)
Patient ID: Crystal LankAdria L Castillo, female   DOB: 02/04/1991, 26 y.o.   MRN: [id number] A&Ox3, no HA, medication compliant, denied SI/HI/AVH, attending group, getting along well with peers; no crying spells, no isolation to room, very well kept in street clothes, logical, appropriate, respect others.

## 2016-08-21 NOTE — Plan of Care (Signed)
Problem: Coping: Goal: Ability to cope will improve Outcome: Progressing Denies SI/HI/AVH, endorse feeling "down." Attended group. Support and encouragement provided.

## 2016-08-22 DIAGNOSIS — F329 Major depressive disorder, single episode, unspecified: Secondary | ICD-10-CM

## 2016-08-22 DIAGNOSIS — F17210 Nicotine dependence, cigarettes, uncomplicated: Secondary | ICD-10-CM

## 2016-08-22 DIAGNOSIS — T1491XA Suicide attempt, initial encounter: Secondary | ICD-10-CM

## 2016-08-22 DIAGNOSIS — F13239 Sedative, hypnotic or anxiolytic dependence with withdrawal, unspecified: Secondary | ICD-10-CM

## 2016-08-22 DIAGNOSIS — T43292A Poisoning by other antidepressants, intentional self-harm, initial encounter: Secondary | ICD-10-CM

## 2016-08-22 DIAGNOSIS — F1120 Opioid dependence, uncomplicated: Secondary | ICD-10-CM

## 2016-08-22 DIAGNOSIS — F603 Borderline personality disorder: Secondary | ICD-10-CM

## 2016-08-22 DIAGNOSIS — F1020 Alcohol dependence, uncomplicated: Secondary | ICD-10-CM

## 2016-08-22 NOTE — Plan of Care (Signed)
Problem: Coping: Goal: Ability to cope will improve Outcome: Progressing Pt stated she was doing better, pt seen on the unit and appeared to have a bright affect at times

## 2016-08-22 NOTE — [facility] Group Notes (Signed)
[facility] Group Notes:  (Nursing/MHT/Case Management/Adjunct)  Date:  08/22/2016  Time:  6:17 AM  Type of Therapy:  Psychoeducational Skills  Participation Level:  Active  Participation Quality:  Appropriate, Attentive, Sharing and Supportive  Affect:  Appropriate  Cognitive:  Appropriate  Insight:  Appropriate and Good  Engagement in Group:  Engaged and Improving  Modes of Intervention:  Discussion, Socialization and Support  Summary of Progress/Problems:  Crystal MilroyLaquanda Y Tyarra Castillo 08/22/2016, 6:17 AM

## 2016-08-22 NOTE — Plan of Care (Signed)
Problem: Education: Goal: Mental status will improve Outcome: Progressing Denies SI/HI/AVH. Voices concern over boyfriend and his whereabouts. Spoke on the phone, crying, but reports "everything is fine."

## 2016-08-22 NOTE — Progress Notes (Signed)
[facility] Progress Note  08/22/2016 9:49 AM Crystal Castillo  MRN:  [id number] Subjective:  Patient is a 26 year old female who [facility] department [facility] on July 1 after overdosing on Wellbutrin (>60 tabs)  At initial presentation patient was tachycardic, confused and was hallucinating. Ethanol level is elevated 168, acetaminophen level was undetectable, and salicylates are undetectable. Urine toxicology was positive for benzodiazepines and cannabis.  [facility] after she was found with decreased respiratory rate in the parking lot of a pharmacy. Patient had ingested heroin, alcohol and klonopin.  Stated this was not a suicidal attempt.   Per records she is f/u by Dr Cyndia BentBadger primary care for MDD and anxiety. Prescribed wellbutrin, topamax and klonopin.   Today the patient states this was not a suicidal attempt. Patient stated that she had been drinking that day with friends and she thought "screw it, I'm going to take some pills".   Patient says she has a lot of stressors currently. Her father passed away in April of this year, she has been unemployed since Sep 2017.  Apparently she was fired after having a fracturing her ankle ankle. Peer her family has been helping with the bills and she also inherited some money after her father passed away.   7/5 patient continues to minimize her substance use and frequent overdoses. She says she feels now fine and would rather be discharge than having to stay here any longer. She is upset because tomorrow is her birthday. Patient says she is no longer interested in going to an intensive outpatient substance abuse program. Patient feels she is not addicted to any substances, feels she can control her use and stop at any time.  Denies suicidality, homicidality or auditory or visual hallucinations. Denies side effects from medications. As far as physical complaints she continues to complain of lower  abdominal pain which is secondary to UTI. She said that she urinated blood this morning.  7/6 patient reports feeling better. Denies hopelessness, helplessness or suicidal ideation. Denies problems with sleep appetite, energy or concentration. Denies homicidality or hallucinations.  08/22/2016 patient seen in her room this morning. Reports that she slept well. She wanted to know why she was started back on the Flagyl. We discussed that her psychiatrist saw some evidence off an infection and decided to start it. She denies any suicidal thoughts. We'll continue to discuss her follow-up plans and substance abuse issues.   She hopes to be discharged soon. Still very ambivalent about intensive outpatient substance abuse. Continues to minimize the use of substances.   Per nursing:  A&Ox3, no HA, medication compliant, denied SI/HI/AVH, attending group, getting along well with peers; no crying spells, no isolation to room, very well kept in street clothes, logical, appropriate, respect others.  Principal Problem: MDD (major depressive disorder) Diagnosis:   Patient Active Problem List   Diagnosis Date Noted  . Alcohol use disorder, moderate, dependence ([name]) [F10.20] 08/19/2016  . Opioid use disorder, moderate, dependence ([name]) [F11.20] 08/19/2016  . Tobacco use disorder [F17.200] 08/19/2016  . Borderline personality disorder [F60.3] 08/19/2016  . Benzodiazepine withdrawal ([name]) [F13.239] 08/19/2016  . MDD (major depressive disorder) [F32.9] 08/18/2016  . Drug overdose, intentional ([name]) [T50.902A] 08/16/2016   Total Time spent with patient: 30 minutes  Past Psychiatric History: Patient has history of depression, eating disorder, obesity, alcohol abuse versus intoxication and history of previous psychiatric hospitalization [place].  From [age] was hospitalized >10 times for  OD on her medications--suicidal attempts.  Long history of self injury (cutting)  At age 26 she was hospitalized  for 3 months due to eating d/o.    Past Medical History:  Past Medical History:  Diagnosis Date  . Anorexia nervosa with bulimia    just finished treatment in August 2012 for this  . Anxiety   . Bartholin's cyst   . Depression   . Diabetes mellitus    not currently treated for this. Pt states she was told she had DM, but lost @  150 lbs in 9 months.  . Diabetes mellitus without complication ([name])   . Obesity   . PTSD (post-traumatic stress disorder)     Past Surgical History:  Procedure Laterality Date  . TONSILLECTOMY    . WISDOM TOOTH EXTRACTION     Family History:  Family History  Problem Relation Age of Onset  . Diabetes Mother    Family Psychiatric  History: father was an alcoholic and had bipolar d/o. Her mother had an eating d/o.  Mother died when pt was 58 y/o.   Social History: Patient currently lives with her boyfriend. Her highest level of education is eighth grade. History  Alcohol Use  . Yes    Comment: occasional drinker - 2 or 3 times a week     History  Drug Use No    Social History   Social History  . Marital status: Single    Spouse name: N/A  . Number of children: N/A  . Years of education: N/A   Social History Main Topics  . Smoking status: Former Smoker    Packs/day: 0.50    Types: Cigarettes  . Smokeless tobacco: Never Used     Comment: rarely smokes  . Alcohol use Yes     Comment: occasional drinker - 2 or 3 times a week  . Drug use: No  . Sexual activity: Yes    Birth control/ protection: Injection   Other Topics Concern  . None   Social History Narrative   ** Merged History Encounter **        Current Medications: Current Facility-Administered Medications  Medication Dose Route Frequency Provider Last Rate Last Dose  . acetaminophen (TYLENOL) tablet 1,000 mg  1,000 mg Oral Q6H PRN Jimmy Footman, MD   1,000 mg at 08/22/16 0834  . alum & mag hydroxide-simeth (MAALOX/MYLANTA) 200-200-20 MG/5ML suspension 30 mL   30 mL Oral Q4H PRN Jimmy Footman, MD      . chlordiazePOXIDE (LIBRIUM) capsule 25 mg  25 mg Oral TID Jimmy Footman, MD   25 mg at 08/22/16 0834  . FLUoxetine (PROZAC) capsule 10 mg  10 mg Oral Daily Jimmy Footman, MD   10 mg at 08/22/16 0834  . magnesium hydroxide (MILK OF MAGNESIA) suspension 30 mL  30 mL Oral Daily PRN Jimmy Footman, MD      . metroNIDAZOLE (FLAGYL) tablet 500 mg  500 mg Oral Q12H Jimmy Footman, MD   500 mg at 08/22/16 0834  . nicotine (NICODERM CQ - dosed in mg/24 hours) patch 14 mg  14 mg Transdermal Daily Jimmy Footman, MD   14 mg at 08/22/16 1610    Lab Results:  Results for orders placed or performed during the hospital encounter of 08/18/16 (from the past 48 hour(s))  Chlamydia/NGC rt PCR ([facility] only)     Status: None   Collection Time: 08/21/16 11:46 AM  Result Value Ref Range   Specimen source GC/Chlam URINE, RANDOM  Chlamydia Tr NOT DETECTED NOT DETECTED   N gonorrhoeae NOT DETECTED NOT DETECTED    Comment: (NOTE) 100  This methodology has not been evaluated in pregnant women or in 200  patients with a history of hysterectomy. 300 400  This methodology will not be performed on patients less than 60  years of age.     Blood Alcohol level:  Lab Results  Component Value Date   ETH <5 08/17/2016   ETH 168 (H) 08/16/2016    Metabolic Disorder Labs: Lab Results  Component Value Date   HGBA1C 5.5 08/19/2016   MPG 111 08/19/2016   No results found for: PROLACTIN No results found for: CHOL, TRIG, HDL, CHOLHDL, VLDL, LDLCALC  Physical Findings: AIMS:  , ,  ,  ,    CIWA:  CIWA-Ar Total: 3 COWS:     Musculoskeletal: Strength & Muscle Tone: within normal limits Gait & Station: normal Patient leans: N/A  Psychiatric Specialty Exam: Physical Exam  Constitutional: She is oriented to person, place, and time. She appears well-developed and well-nourished.  HENT:  Head:  Normocephalic and atraumatic.  Eyes: Conjunctivae and EOM are normal.  Neck: Normal range of motion.  Respiratory: Effort normal.  Musculoskeletal: Normal range of motion.  Neurological: She is alert and oriented to person, place, and time.    Review of Systems  Constitutional: Negative.   HENT: Negative.   Eyes: Negative.   Respiratory: Negative.   Cardiovascular: Negative.   Gastrointestinal: Negative.   Genitourinary: Positive for dysuria and flank pain.  Skin: Negative.   Neurological: Negative.   Endo/Heme/Allergies: Negative.   Psychiatric/Behavioral: Positive for depression and substance abuse. Negative for hallucinations, memory loss and suicidal ideas. The patient is not nervous/anxious and does not have insomnia.     Blood pressure 115/68, pulse 81, temperature 98 F (36.7 C), temperature source Oral, resp. rate 18, height 5\' 7"  (1.702 m), weight 214 lb (97.1 kg), last menstrual period 07/20/2015, SpO2 98 %.Body mass index is 33.52 kg/m.  General Appearance: Fairly Groomed  Eye Contact:  Good  Speech:  Clear and Coherent  Volume:  Normal  Mood:  Dysphoric  Affect:  Blunt  Thought Process:  Linear and Descriptions of Associations: Intact  Orientation:  Full (Time, Place, and Person)  Thought Content:  Hallucinations: None  Suicidal Thoughts:  No  Homicidal Thoughts:  No  Memory:  Immediate;   Good Recent;   Good Remote;   Good  Judgement:  Poor  Insight:  Lacking  Psychomotor Activity:  Normal  Concentration:  Concentration: Good and Attention Span: Good  Recall:  Good  Fund of Knowledge:  Good  Language:  Good  Akathisia:  No  Handed:    AIMS (if indicated):     Assets:  Manufacturing systems engineer Social Support  ADL's:  Intact  Cognition:  WNL  Sleep:  Number of Hours: 7     Treatment Plan Summary: Daily contact with patient to assess and evaluate symptoms and progress in treatment and Medication management   26 year old female with long history of  multiple suicidal attempts by overdose, substance abuse and likely borderline personality disorder  MDD: Patient has been tried on a multitude of antidepressants over the years. We will start low-dose fluoxetine 10 mg a day  PTSD: We'll start the patient on low-dose fluoxetine which was beneficial in the past, patient appears to be tolerating well.  Borderline personality disorder the patient will be referred to intensive outpatient treatment.  Polysubstance abuse: Patient has minimal  insight into the severity of her addiction. She feels she can stop drinking at this time and she also feels she is not addicted to opiates. My recommendation for this patient is a referral for intensive outpatient substance abuse treatment---pt is not interested  Benzodiazepine withdrawal: Patient says she has been prescribed with Ativan 3 mg a day by her primary care provider for the last year. She takes his medication daily. She will be started on a Librium taper. Today I have decreased her Librium to 25 mg 3 times a day  DM: Does not appear to have diabetes. Hemoglobin A1c is 5.5.  Nicotine use: Patient has orders for a nicotine patch   UTI: UA positive for UTI but culture neg. Will d/c macrobid  Bacterial vaginosis: Patient complains of significant discharge or discomfort. She was diagnosed with Trichomonas recently. Started on flagyl 500 mg bid   STD: Possible chlamydia versus gonorrhea. We'll order urine test for these.   Labs: HbA1c ( 5.5 )TSH (wnl)  Possible d/c early next week.   Patrick North, MD 08/22/2016, 9:49 AM

## 2016-08-22 NOTE — Progress Notes (Signed)
Pt reporting feeling better as far as pain during urination/vaginal pain goes today. Complained of headache relieved by tylenol this morning. Voiced that she had not heard from her boyfriend since he visited last night, appears and voices anxiety. "He's got my car and my phone. He won't answer at any number I call." Pt at lunch observed crying on the phone, but once she was finished talking, reported to this nurse that "everything is fine." Minimizes problems. Reports fair sleep last night, good appetite, normal energy, good concentration. Rates depression 3/10, hopelessness 1/10, anxiety 7/10 (low 0-10 high). Denies SI/HI/AVH. Goal today is "try and work on discharge plan" by "talk with right people." Pt is medication compliant. Attends group. Isolative to room more today, but appropriately interacts with staff/peers when present in milieu. Support and encouragement provided. Medications administered as ordered with education. Safety maintained with every 15 minute checks. Will continue to monitor.

## 2016-08-22 NOTE — Progress Notes (Signed)
D: Pt denies SI/HI/AVH. Pt is pleasant and cooperative. Pt seen interacting on unit, pt refused her Flagyl, " the doctor said she was going to discontinue both of them"  A: Pt was offered support and encouragement. Pt was given scheduled medications. Pt was encourage to attend groups. Q 15 minute checks were done for safety.   R:Pt attends groups and interacts well with peers and staff. Pt is taking medication. Pt has no complaints.Pt receptive to treatment and safety maintained on unit.

## 2016-08-23 NOTE — Progress Notes (Signed)
[facility] MD Progress Note  08/23/2016 9:42 AM Crystal Castillo  MRN:  [id number] Subjective:  Patient is a 26 year old female who presented to the emergency department at [facility] on July 1 after overdosing on Wellbutrin (>60 tabs)  At initial presentation patient was tachycardic, confused and was hallucinating. Ethanol level is elevated 168, acetaminophen level was undetectable, and salicylates are undetectable. Urine toxicology was positive for benzodiazepines and cannabis.  Pt was seen at [facility] ER on 6/5 after she was found with decreased respiratory rate in the parking lot of a pharmacy. Patient had ingested heroin, alcohol and klonopin.  Stated this was not a suicidal attempt.   Per records she is f/u by Dr Cyndia Bent primary care for MDD and anxiety. Prescribed wellbutrin, topamax and klonopin.   Today the patient states this was not a suicidal attempt. Patient stated that she had been drinking that day with friends and she thought "screw it, I'm going to take some pills".   Patient says she has a lot of stressors currently. Her father passed away in 06/14/2022 of this year, she has been unemployed since Sep 2017.  Apparently she was fired after having a fracturing her ankle ankle. Peer her family has been helping with the bills and she also inherited some money after her father passed away.   2022-09-09 patient continues to minimize her substance use and frequent overdoses. She says she feels now fine and would rather be discharge than having to stay here any longer. She is upset because tomorrow is her birthday. Patient says she is no longer interested in going to an intensive outpatient substance abuse program. Patient feels she is not addicted to any substances, feels she can control her use and stop at any time.  Denies suicidality, homicidality or auditory or visual hallucinations. Denies side effects from medications. As far as physical complaints she continues to complain of lower  abdominal pain which is secondary to UTI. She said that she urinated blood this morning.  7/6 patient reports feeling better. Denies hopelessness, helplessness or suicidal ideation. Denies problems with sleep appetite, energy or concentration. Denies homicidality or hallucinations.  08/22/2016 patient seen in her room this morning. Reports that she slept well. She wanted to know why she was started back on the Flagyl. We discussed that her psychiatrist saw some evidence off an infection and decided to start it. She denies any suicidal thoughts. We'll continue to discuss her follow-up plans and substance abuse issues.  08/23/2016 Patient seen today. States her mood is much better. She would like to follow up with IOP. She is worried about the cost of her hospital stay and wants to be discharged.She reports good sleep and fair appetite. 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 Denies any suicidal thoughts.   She hopes to be discharged soon. Still very ambivalent about intensive outpatient substance abuse. Continues to minimize the use of substances.   Per nursing:  A&Ox3, no HA, medication compliant, denied SI/HI/AVH, attending group, getting along well with peers; no crying spells, no isolation to room, very well kept in street clothes, logical, appropriate, respect others.  Principal Problem: MDD (major depressive disorder) Diagnosis:   Patient Active Problem List   Diagnosis Date Noted  . Alcohol use disorder, moderate, dependence ([name]) [F10.20] 08/19/2016  . Opioid use disorder, moderate, dependence ([name]) [F11.20] 08/19/2016  . Tobacco use disorder [F17.200] 08/19/2016  . Borderline personality disorder [F60.3] 08/19/2016  .  Benzodiazepine withdrawal ([name]) [F13.239] 08/19/2016  . MDD (major depressive disorder) [F32.9] 08/18/2016  . Drug overdose, intentional ([name]) [T50.902A] 08/16/2016   Total Time spent with patient: 30 minutes  Past  Psychiatric History: Patient has history of depression, eating disorder, obesity, alcohol abuse versus intoxication and history of previous psychiatric hospitalization [place].  From [age] was hospitalized >10 times for OD on her medications--suicidal attempts.  Long history of self injury (cutting)  At age 26 she was hospitalized for 3 months due to eating d/o.    Past Medical History:  Past Medical History:  Diagnosis Date  . Anorexia nervosa with bulimia    just finished treatment in August 2012 for this  . Anxiety   . Bartholin's cyst   . Depression   . Diabetes mellitus    not currently treated for this. Pt states she was told she had DM, but lost @  150 lbs in 9 months.  . Diabetes mellitus without complication ([name])   . Obesity   . PTSD (post-traumatic stress disorder)     Past Surgical History:  Procedure Laterality Date  . TONSILLECTOMY    . WISDOM TOOTH EXTRACTION     Family History:  Family History  Problem Relation Age of Onset  . Diabetes Mother    Family Psychiatric  History: father was an alcoholic and had bipolar d/o. Her mother had an eating d/o.  Mother died when pt was 89 y/o.   Social History: Patient currently lives with her boyfriend. Her highest level of education is eighth grade. History  Alcohol Use  . Yes    Comment: occasional drinker - 2 or 3 times a week     History  Drug Use No    Social History   Social History  . Marital status: Single    Spouse name: N/A  . Number of children: N/A  . Years of education: N/A   Social History Main Topics  . Smoking status: Former Smoker    Packs/day: 0.50    Types: Cigarettes  . Smokeless tobacco: Never Used     Comment: rarely smokes  . Alcohol use Yes     Comment: occasional drinker - 2 or 3 times a week  . Drug use: No  . Sexual activity: Yes    Birth control/ protection: Injection   Other Topics Concern  . None   Social History Narrative   ** Merged History Encounter **         Current Medications: Current Facility-Administered Medications  Medication Dose Route Frequency Provider Last Rate Last Dose  . acetaminophen (TYLENOL) tablet 1,000 mg  1,000 mg Oral Q6H PRN Jimmy FootmanHernandez-Gonzalez, Andrea, MD   1,000 mg at 08/22/16 0834  . alum & mag hydroxide-simeth (MAALOX/MYLANTA) 200-200-20 MG/5ML suspension 30 mL  30 mL Oral Q4H PRN Jimmy FootmanHernandez-Gonzalez, Andrea, MD      . chlordiazePOXIDE (LIBRIUM) capsule 25 mg  25 mg Oral TID Jimmy FootmanHernandez-Gonzalez, Andrea, MD   25 mg at 08/23/16 0752  . FLUoxetine (PROZAC) capsule 10 mg  10 mg Oral Daily Jimmy FootmanHernandez-Gonzalez, Andrea, MD   10 mg at 08/23/16 0752  . magnesium hydroxide (MILK OF MAGNESIA) suspension 30 mL  30 mL Oral Daily PRN Jimmy FootmanHernandez-Gonzalez, Andrea, MD      . metroNIDAZOLE (FLAGYL) tablet 500 mg  500 mg Oral Q12H Jimmy FootmanHernandez-Gonzalez, Andrea, MD   500 mg at 08/23/16 0752  . nicotine (NICODERM CQ - dosed in mg/24 hours) patch 14 mg  14 mg Transdermal Daily Jimmy FootmanHernandez-Gonzalez, Andrea, MD  14 mg at 08/23/16 1610    Lab Results:  Results for orders placed or performed during the hospital encounter of 08/18/16 (from the past 48 hour(s))  Chlamydia/NGC rt PCR ([facility] only)     Status: None   Collection Time: 08/21/16 11:46 AM  Result Value Ref Range   Specimen source GC/Chlam URINE, RANDOM    Chlamydia Tr NOT DETECTED NOT DETECTED   N gonorrhoeae NOT DETECTED NOT DETECTED    Comment: (NOTE) 100  This methodology has not been evaluated in pregnant women or in 200  patients with a history of hysterectomy. 300 400  This methodology will not be performed on patients less than 32  years of age.     Blood Alcohol level:  Lab Results  Component Value Date   ETH <5 08/17/2016   ETH 168 (H) 08/16/2016    Metabolic Disorder Labs: Lab Results  Component Value Date   HGBA1C 5.5 08/19/2016   MPG 111 08/19/2016   No results found for: PROLACTIN No results found for: CHOL, TRIG, HDL, CHOLHDL, VLDL, LDLCALC  Physical  Findings: AIMS:  , ,  ,  ,    CIWA:  CIWA-Ar Total: 3 COWS:     Musculoskeletal: Strength & Muscle Tone: within normal limits Gait & Station: normal Patient leans: N/A  Psychiatric Specialty Exam: Physical Exam  Constitutional: She is oriented to person, place, and time. She appears well-developed and well-nourished.  HENT:  Head: Normocephalic and atraumatic.  Eyes: Conjunctivae and EOM are normal.  Neck: Normal range of motion.  Respiratory: Effort normal.  Musculoskeletal: Normal range of motion.  Neurological: She is alert and oriented to person, place, and time.    Review of Systems  Constitutional: Negative.   HENT: Negative.   Eyes: Negative.   Respiratory: Negative.   Cardiovascular: Negative.   Gastrointestinal: Negative.   Genitourinary: Positive for dysuria and flank pain.  Skin: Negative.   Neurological: Negative.   Endo/Heme/Allergies: Negative.   Psychiatric/Behavioral: Positive for depression and substance abuse. Negative for hallucinations, memory loss and suicidal ideas. The patient is not nervous/anxious and does not have insomnia.     Blood pressure 106/61, pulse 84, temperature 98.7 F (37.1 C), temperature source Oral, resp. rate 18, height 5\' 7"  (1.702 m), weight 214 lb (97.1 kg), last menstrual period 07/20/2015, SpO2 98 %.Body mass index is 33.52 kg/m.  General Appearance: Fairly Groomed  Eye Contact:  Good  Speech:  Clear and Coherent  Volume:  Normal  Mood:  improving  Affect:  Blunt  Thought Process:  Linear and Descriptions of Associations: Intact  Orientation:  Full (Time, Place, and Person)  Thought Content:  Hallucinations: None  Suicidal Thoughts:  No  Homicidal Thoughts:  No  Memory:  Immediate;   Good Recent;   Good Remote;   Good  Judgement:  Poor  Insight:  Lacking  Psychomotor Activity:  Normal  Concentration:  Concentration: Good and Attention Span: Good  Recall:  Good  Fund of Knowledge:  Good  Language:  Good   Akathisia:  No  Handed:    AIMS (if indicated):     Assets:  Manufacturing systems engineer Social Support  ADL's:  Intact  Cognition:  WNL  Sleep:  Number of Hours: 7     Treatment Plan Summary: Daily contact with patient to assess and evaluate symptoms and progress in treatment and Medication management   26 year old female with long history of multiple suicidal attempts by overdose, substance abuse and likely borderline personality disorder  MDD: Patient has been tried on a multitude of antidepressants over the years.  Fluoxetine 10 mg a day  PTSD: We'll start the patient on low-dose fluoxetine which was beneficial in the past, patient appears to be tolerating well.  Borderline personality disorder the patient will be referred to intensive outpatient treatment.  Polysubstance abuse: Patient has minimal insight into the severity of her addiction. She feels she can stop drinking at this time and she also feels she is not addicted to opiates. My recommendation for this patient is a referral for intensive outpatient substance abuse treatment---pt is not interested  Benzodiazepine withdrawal: Decrease librium to 25mg  po bid.  DM: Does not appear to have diabetes. Hemoglobin A1c is 5.5.  Nicotine use: Patient has orders for a nicotine patch   UTI: UA positive for UTI but culture neg. Will d/c macrobid  Bacterial vaginosis: Continue flagyl 500 mg bid   STD: Possible chlamydia versus gonorrhea. We'll order urine test for these.   Labs: HbA1c ( 5.5 )TSH (wnl)  Possible d/c early next week.   Patrick North, MD 08/23/2016, 9:42 AM

## 2016-08-23 NOTE — Plan of Care (Signed)
Problem: Education: Goal: Emotional status will improve Outcome: Progressing Pt stated she was doing ok, pt still dealing with her fathers death Goal: Mental status will improve Outcome: Progressing Pt said she has been feeling better by the day  Problem: Education: Goal: Knowledge of the prescribed therapeutic regimen will improve Outcome: Progressing Pt understood the reason for the continued Flagyl use  Problem: Coping: Goal: Ability to cope will improve Outcome: Progressing Pt seen on unit talking to peers and seen on phone talking with friends/ family  Problem: Activity: Goal: Sleeping patterns will improve Outcome: Progressing Pt has been sleeping over 6 hrs a night

## 2016-08-23 NOTE — [facility] Group Notes (Signed)
[facility] Group Notes:  (Nursing/MHT/Case Management/Adjunct)  Date:  08/23/2016  Time:  7:24 AM  Type of Therapy:  Psychoeducational Skills  Participation Level:  Active  Participation Quality:  Appropriate, Attentive and Sharing  Affect:  Appropriate  Cognitive:  Appropriate  Insight:  Appropriate  Engagement in Group:  Engaged  Modes of Intervention:  Discussion, Socialization and Support  Summary of Progress/Problems:  Crystal Castillo 08/23/2016, 7:24 AM

## 2016-08-23 NOTE — Progress Notes (Signed)
Patient pleasant and cooperative with care. Med and group compliant. Denies SI, HI, AVH. Reports ready to go home and wanted to know when her flagyl is going to end. Interacts appropriately with staff and peers, she spent most of the evening in the dayroom with peers. Encouragement and support offered. Safety checks maintained. Meds given as prescribed. Pt remains safe on unit with q 15 min checks. She appear to be resting in bed quietly at this time.

## 2016-08-23 NOTE — Progress Notes (Signed)
Patient pleasant and cooperative with care. Med and group compliant. Denies SI, HI, AVH. Reports ready to go home. Interacts appropriately with staff and peers. Uses coping skills for stress. Encouragement and support offered. Safety checks maintained. Meds given as prescribed. Pt remains safe on unit with q 15 min checks.

## 2016-08-23 NOTE — Progress Notes (Signed)
D: Pt denies SI/HI/AVh. Pt is pleasant and cooperative. Pt stated she was doing better, pt said she was doing better coping with her dads death. Pt stated she has been trying to cope with drugs/ ETOH and said she is going to try to stop  A: Pt was offered support and encouragement. Pt was given scheduled medications. Pt was encourage to attend groups. Q 15 minute checks were done for safety.    R:Pt attends groups and interacts well with peers and staff. Pt is taking medication. Pt has no complaints.Pt receptive to treatment and safety maintained on unit.

## 2016-08-23 NOTE — Plan of Care (Signed)
Problem: Education: Goal: Emotional status will improve Outcome: Progressing Pt reports feeling good. Able to use coping skills in no distress

## 2016-08-23 NOTE — [facility] Group Notes (Signed)
[facility] LCSW Group Therapy  08/23/2016 2:14 PM  Type of Therapy:  Group Therapy  Participation Level:  Active  Participation Quality:  Appropriate  Affect:  Appropriate  Cognitive:  Alert  Insight:  Developing/Improving  Engagement in Therapy:  Developing/Improving  Modes of Intervention:  Activity, Discussion, Education, Problem-solving, Reality Testing, Socialization and Support  Summary of Progress/Problems: Self esteem: Patients discussed self esteem and how it impacts them. They discussed what aspects in their lives has influenced their self esteem. They were challenged to identify changes that are needed in order to improve self esteem. Patients participated in activity where they had to identify positive adjectives they felt described their personality. Patients shared with the group on the following areas: Things I am good at, What I like about my appearance, I've helped others by, What I value the most, compliments I have received, challenges I have overcome, thing that make me unique, and Times I've made others happy.   Merlie Noga G. Garnette CzechSampson MSW, LCSWA 08/23/2016, 2:16 PM

## 2016-08-24 ENCOUNTER — Encounter: Payer: Self-pay | Admitting: Psychiatry

## 2016-08-24 MED ORDER — FLUOXETINE HCL 20 MG PO CAPS: 20.0000 mg | ORAL_CAPSULE | Freq: Every day | ORAL | Status: DC

## 2016-08-24 MED ORDER — NALOXONE HCL 4 MG/0.1ML NA LIQD: NASAL | 1 refills | Status: DC

## 2016-08-24 MED ORDER — FLUOXETINE HCL 20 MG PO CAPS: 20.0000 mg | ORAL_CAPSULE | Freq: Every day | ORAL | 1 refills | Status: DC

## 2016-08-24 MED ORDER — METRONIDAZOLE 500 MG PO TABS: 500.0000 mg | ORAL_TABLET | Freq: Two times a day (BID) | ORAL | 0 refills | Status: DC

## 2016-08-24 NOTE — [facility] Group Notes (Signed)
[facility] Group Notes:  (Nursing/MHT/Case Management/Adjunct)  Date:  08/24/2016  Time:  6:16 AM  Type of Therapy:  Psychoeducational Skills  Participation Level:  Active  Participation Quality:  Appropriate, Attentive and Sharing  Affect:  Appropriate  Cognitive:  Appropriate  Insight:  Appropriate and Good  Engagement in Group:  Engaged  Modes of Intervention:  Discussion, Socialization and Support  Summary of Progress/Problems:  Crystal MilroyLaquanda Y [name] 08/24/2016, 6:16 AM
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
## 2016-08-24 NOTE — Progress Notes (Signed)
  [facility] :  Will you be returning to the same living situation after discharge:  Yes,  with boyfriend At discharge, do you have transportation home?: Yes,  boyfriend Do you have the ability to pay for your medications: Yes,  BCBS  Release of information consent forms completed and in the chart;  Patient's signature needed at discharge.  Patient to Follow up at: Follow-up Information    Eartha InchBadger, Michael C, MD Follow up.   Specialty:  Family Medicine Contact information: [address] [place] [address] [phone]        [facility] Go on 08/28/2016.   Why:  Please attend your therapy appointment with Minus BreedingMelissa Reed on Friday, 08/28/16, 11:30am.  Please attend your medication management appointment with Corky SingLaurie Erena on 10/07/16 at 11am.  Please bring a copy of your hospital discharge paperwork. Contact information: [address] [place] [address] [phone]           Next level of care provider [facility] Link:no  Safety Planning and Suicide Prevention discussed: No.Pt refused.  Have you used any form of tobacco in the last 30 days? (Cigarettes, Smokeless Tobacco, Cigars, and/or Pipes): Yes  Has patient been referred to the Quitline?: Patient refused referral  Patient has been referred for addiction treatment: Yes  Lorri FrederickWierda, Monifa Blanchette Jon, LCSW 08/24/2016, 12:47 PM

## 2016-08-24 NOTE — [facility] Suicide Risk Assessment (Signed)
[facility]   Principal Problem: MDD (major depressive disorder) Discharge Diagnoses:  Patient Active Problem List   Diagnosis Date Noted  . Alcohol use disorder, moderate, dependence ([name]) [F10.20] 08/19/2016  . Opioid use disorder, moderate, dependence ([name]) [F11.20] 08/19/2016  . Tobacco use disorder [F17.200] 08/19/2016  . Borderline personality disorder [F60.3] 08/19/2016  . Benzodiazepine withdrawal ([name]) [F13.239] 08/19/2016  . MDD (major depressive disorder) [F32.9] 08/18/2016  . Drug overdose, intentional ([name]) [T50.902A] 08/16/2016    Total Time spent with patient: 30 minutes  Musculoskeletal: Strength & Muscle Tone: within normal limits Gait & Station: normal Patient leans: N/A  Psychiatric Specialty Exam: Review of Systems  Psychiatric/Behavioral: Positive for substance abuse.  All other systems reviewed and are negative.   Blood pressure 102/63, pulse 63, temperature 98.4 F (36.9 C), temperature source Oral, resp. rate 18, height 5\' 7"  (1.702 m), weight 97.1 kg (214 lb), last menstrual period 07/20/2015, SpO2 98 %.Body mass index is 33.52 kg/m.  General Appearance: Casual  Eye Contact::  Good  Speech:  Clear and Coherent409  Volume:  Normal  Mood:  Euthymic  Affect:  Appropriate  Thought Process:  Goal Directed and Descriptions of Associations: Intact  Orientation:  Full (Time, Place, and Person)  Thought Content:  WDL  Suicidal Thoughts:  No  Homicidal Thoughts:  No  Memory:  Immediate;   Fair Recent;   Fair Remote;   Fair  Judgement:  Impaired  Insight:  Shallow  Psychomotor Activity:  Normal  Concentration:  Fair  Recall:  [employer]  Fund of Knowledge:Fair  Language: Fair  Akathisia:  No  Handed:  Right  AIMS (if indicated):     Assets:  Communication Skills Desire for Improvement Financial Resources/Insurance Housing Intimacy Physical Health Resilience Social Support Transportation  Sleep:  Number of Hours: 7   Cognition: WNL  ADL's:  Intact   Mental Status Per Nursing Assessment::   On Admission:     Demographic Factors:  Adolescent or young adult, Caucasian and Unemployed  Loss Factors: Loss of significant relationship and Financial problems/change in socioeconomic status  Historical Factors: Prior suicide attempts, Family history of mental illness or substance abuse and Impulsivity  Risk Reduction Factors:   Sense of responsibility to family, Living with another person, especially a relative, Positive social support and Positive therapeutic relationship  Continued Clinical Symptoms:  Depression:   Comorbid alcohol abuse/dependence Impulsivity Alcohol/Substance Abuse/Dependencies  Cognitive Features That Contribute To Risk:  None    Suicide Risk:  Minimal: No identifiable suicidal ideation.  Patients presenting with no risk factors but with morbid ruminations; may be classified as minimal risk based on the severity of the depressive symptoms  Follow-up Information    Eartha InchBadger, Michael C, MD Follow up.   Specialty:  Family Medicine Contact information: [address] [place] [address] [phone]           Plan Of [place] tolerated.ollow-up recommendations:  Activity:  as tolerated. Diet:  regular. Other:  keep follow up appointments.  Kristine LineaJolanta Juron Vorhees, MD 08/24/2016, 11:12 AM

## 2016-08-24 NOTE — Progress Notes (Signed)
   08/24/16 1040  Clinical Encounter Type  Visited With Patient;Health care provider  Visit Type Initial;Behavioral Health  Referral From Care management   Chaplain present during treatment team meeting. Patient stated ready to go. Patient plans to follow up with treatment. Chaplain will visit before patient is discharged to follow up.

## 2016-08-24 NOTE — Tx Team (Signed)
Interdisciplinary Treatment and Diagnostic Plan Update  08/24/2016 Time of Session: 51 AM Crystal Castillo MRN: [id number]  Principal Diagnosis: Major depressive disorder, recurrent severe without psychotic features ([place])  Secondary Diagnoses: Principal Problem:   Major depressive disorder, recurrent severe without psychotic features (Crystal Castillo) Active Problems:   Drug overdose, intentional (Crystal Castillo)   Alcohol use disorder, moderate, dependence (Crystal Castillo)   Opioid use disorder, moderate, dependence ([name])   Tobacco use disorder   Borderline personality disorder   Benzodiazepine withdrawal (Crystal Castillo)   Current Medications:  Current Facility-Administered Medications  Medication Dose Route Frequency Provider Last Rate Last Dose  . acetaminophen (TYLENOL) tablet 1,000 mg  1,000 mg Oral Q6H PRN Hildred Priest, MD   1,000 mg at 08/22/16 0834  . alum & mag hydroxide-simeth (MAALOX/MYLANTA) 200-200-20 MG/5ML suspension 30 mL  30 mL Oral Q4H PRN Hildred Priest, MD      . chlordiazePOXIDE (LIBRIUM) capsule 25 mg  25 mg Oral TID Hildred Priest, MD   25 mg at 08/24/16 1145  . [START ON 08/25/2016] FLUoxetine (PROZAC) capsule 20 mg  20 mg Oral Daily [name], [name], MD      . magnesium hydroxide (MILK OF MAGNESIA) suspension 30 mL  30 mL Oral Daily PRN Hildred Priest, MD      . metroNIDAZOLE (FLAGYL) tablet 500 mg  500 mg Oral Q12H Hildred Priest, MD   500 mg at 08/24/16 0743  . nicotine (NICODERM CQ - dosed in mg/24 hours) patch 14 mg  14 mg Transdermal Daily Hildred Priest, MD   14 mg at 08/24/16 0745   Current Outpatient Prescriptions  Medication Sig Dispense Refill  . [START ON 08/25/2016] FLUoxetine (PROZAC) 20 MG capsule Take 1 capsule (20 mg total) by mouth daily. 30 capsule 1  . metroNIDAZOLE (FLAGYL) 500 MG tablet Take 1 tablet (500 mg total) by mouth every 12 (twelve) hours. 6 tablet 0  . naloxone (NARCAN) nasal spray 4 mg/0.1 mL Use  as directed in overdose. 1 kit 1   PTA Medications: No prescriptions prior to admission.    Patient Stressors: Loss of Father in April 2018 Substance abuse  Patient Strengths: Capable of independent living Motivation for treatment/growth  Treatment Modalities: Medication Management, Group therapy, Case management,  1 to 1 session with clinician, Psychoeducation, Recreational therapy.   Physician Treatment Plan for Primary Diagnosis: Major depressive disorder, recurrent severe without psychotic features (Crystal Castillo) Long Term Goal(s): Improvement in symptoms so as ready for discharge Improvement in symptoms so as ready for discharge   Short Term Goals: Ability to identify changes in lifestyle to reduce recurrence of condition will improve Ability to verbalize feelings will improve Ability to identify and develop effective coping behaviors will improve Ability to identify triggers associated with substance abuse/mental health issues will improve Ability to demonstrate self-control will improve  Medication Management: Evaluate patient's response, side effects, and tolerance of medication regimen.  Therapeutic Interventions: 1 to 1 sessions, Unit Group sessions and Medication administration.  Evaluation of Outcomes: Adequate for Discharge  Physician Treatment Plan for Secondary Diagnosis: Principal Problem:   Major depressive disorder, recurrent severe without psychotic features ([name]) Active Problems:   Drug overdose, intentional (Crystal Castillo)   Alcohol use disorder, moderate, dependence (Crystal Castillo)   Opioid use disorder, moderate, dependence ([name])   Tobacco use disorder   Borderline personality disorder   Benzodiazepine withdrawal (Crystal Castillo)  Long Term Goal(s): Improvement in symptoms so as ready for discharge Improvement in symptoms so as ready for discharge   Short Term Goals: Ability to identify  changes in lifestyle to reduce recurrence of condition will improve Ability to verbalize feelings will  improve Ability to identify and develop effective coping behaviors will improve Ability to identify triggers associated with substance abuse/mental health issues will improve Ability to demonstrate self-control will improve     Medication Management: Evaluate patient's response, side effects, and tolerance of medication regimen.  Therapeutic Interventions: 1 to 1 sessions, Unit Group sessions and Medication administration.  Evaluation of Outcomes: Adequate for Discharge   RN Treatment Plan for Primary Diagnosis: Major depressive disorder, recurrent severe without psychotic features (Crystal Castillo) Long Term Goal(s): Knowledge of disease and therapeutic regimen to maintain health will improve  Short Term Goals: Ability to verbalize feelings will improve, Ability to identify and develop effective coping behaviors will improve and Compliance with prescribed medications will improve  Medication Management: RN will administer medications as ordered by provider, will assess and evaluate patient's response and provide education to patient for prescribed medication. RN will report any adverse and/or side effects to prescribing provider.  Therapeutic Interventions: 1 on 1 counseling sessions, Psychoeducation, Medication administration, Evaluate responses to treatment, Monitor vital signs and CBGs as ordered, Perform/monitor CIWA, COWS, AIMS and Fall Risk screenings as ordered, Perform wound care treatments as ordered.  Evaluation of Outcomes: Adequate for Discharge   LCSW Treatment Plan for Primary Diagnosis: Major depressive disorder, recurrent severe without psychotic features (Crystal Castillo) Long Term Goal(s): Safe transition to appropriate next level of care at discharge, Engage patient in therapeutic group addressing interpersonal concerns.  Short Term Goals: Engage patient in aftercare planning with referrals and resources, Increase social support, Increase emotional regulation and Identify triggers associated  with mental health/substance abuse issues  Therapeutic Interventions: Assess for all discharge needs, 1 to 1 time with Social worker, Explore available resources and support systems, Assess for adequacy in community support network, Educate family and significant other(s) on suicide prevention, Complete Psychosocial Assessment, Interpersonal group therapy.  Evaluation of Outcomes: Adequate for Discharge   Progress in Treatment: Attending groups: Yes. Participating in groups: Yes. Taking medication as prescribed: Yes. Toleration medication: Yes. Family/Significant other contact made: No, will contact:  Pt refused family contact. Patient understands diagnosis: Yes. Discussing patient identified problems/goals with staff: Yes. Medical problems stabilized or resolved: Yes. Denies suicidal/homicidal ideation: Yes. Issues/concerns per patient self-inventory: No.  New problem(s) identified: No, Describe:  None.  New Short Term/Long Term Goal(s): Patient stated that her goal is to learn more appropriate coping mechanisms.  Discharge Plan or Barriers: Outpt services at [facility].  Reason for Continuation of Hospitalization: None: discharge today.  Estimated Length of Stay: Discharge today.  Attendees: Patient:Crystal Castillo 08/24/2016   Physician: Dr. Bary Leriche, MD 08/24/2016   Nursing: Elige Radon, RN 08/24/2016   RN Care Manager: 08/24/2016   Social Worker: Lurline Idol, LCSW 08/24/2016   Recreational Therapist: Everitt Amber, LRT/CTRS  08/24/2016   Other: Drue Dun, chaplain 08/24/2016   Other:  08/24/2016   Other: 08/24/2016        Scribe for Treatment Team: Joanne Chars, LCSW 08/24/2016 1:54 PM

## 2016-08-24 NOTE — Progress Notes (Signed)
Recreation Therapy Notes  INPATIENT RECREATION TR PLAN  Patient Details Name: Crystal Castillo MRN: [id number] DOB: 05-Nov-1990 Today's Date: 08/24/2016  Rec Therapy Plan Is patient appropriate for Therapeutic Recreation?: Yes Treatment times per week: At least once a week TR Treatment/Interventions: 1:1 session, Group participation (Comment) (Appropriate participation in daily recreational therapy tx)  Discharge Criteria Pt will be discharged from therapy if:: Treatment goals are met, Discharged Treatment plan/goals/alternatives discussed and agreed upon by:: Patient/family  Discharge Summary Short term goals set: See Care Plan Short term goals met: Adequate for discharge Progress toward goals comments: One-to-one attended Which groups?: Leisure education, Social skills One-to-one attended: Self-esteem, coping skills Reason goals not met: Patient d/c before goals could be met. Therapeutic equipment acquired: None Reason patient discharged from therapy: Discharge from hospital Pt/family agrees with progress & goals achieved: Yes Date patient discharged from therapy: 08/24/16   Leonette Monarch, LRT/CTRS 08/24/2016, 2:45 PM

## 2016-08-24 NOTE — Progress Notes (Signed)
Provided and reviewed discharge paperwork and prescriptions. Verified understanding by use of teach back method. Verbalizes understanding as well. Denies SI/HI/AVH, pain. No belongings present to return to patient, she was given all belongings on admission. Pt's boyfriend on his way to transport patient home. Safety maintained. Will continue to monitor.

## 2016-08-24 NOTE — [facility] Group Notes (Signed)
[facility] LCSW Group Therapy Note  Date/Time: 08/24/16, 0930  Type of Therapy and Topic:  Group Therapy:  Overcoming Obstacles  Participation Level:  active  Description of Group:    In this group patients will be encouraged to explore what they see as obstacles to their own wellness and recovery. They will be guided to discuss their thoughts, feelings, and behaviors related to these obstacles. The group will process together ways to cope with barriers, with attention given to specific choices patients can make. Each patient will be challenged to identify changes they are motivated to make in order to overcome their obstacles. This group will be process-oriented, with patients participating in exploration of their own experiences as well as giving and receiving support and challenge from other group members.  Therapeutic Goals: 1. Patient will identify personal and current obstacles as they relate to admission. 2. Patient will identify barriers that currently interfere with their wellness or overcoming obstacles.  3. Patient will identify feelings, thought process and behaviors related to these barriers. 4. Patient will identify two changes they are willing to make to overcome these obstacles:    Summary of Patient Progress: Pt was very appropriate today and shared a number of obstacles she is facing: addiction, death of her father, medical issues.  Pt is planning to address substance use issues in outpt treatment and is also considering Hospice for a grief group.  Very appropriate.      Therapeutic Modalities:   Cognitive Behavioral Therapy Solution Focused Therapy Motivational Interviewing Relapse Prevention Therapy  Daleen SquibbGreg Clover Feehan, LCSW

## 2017-12-22 ENCOUNTER — Encounter: Payer: Self-pay | Admitting: *Deleted

## 2017-12-27 ENCOUNTER — Telehealth: Payer: Self-pay | Admitting: *Deleted

## 2017-12-27 NOTE — Telephone Encounter (Addendum)
Following consult with Dr. Adrian Blackwater I called pt to discuss her referral for prenatal care. Pt confirms that she was told to stop taking Metformin. Her previous prescribed dose was 1000 mg twice daily however she has not been taking it as prescribed. Since July, she has been taking it sporadically - 1 tablet every 3-4 days. Pt has not been checking her blood sugar because she needs new batteries for her meter which is about 25 years old and has run out of test strips. I advised pt that she needs to meet with our diabetes educator who will give her diet information as well as get her set up to check blood sugars on a regular basis. Pt was advised to begin taking Metformin 500 mg twice daily with a meal.  Pt voiced understanding and agreed to Diabetes education appt on 11/14 @ 1000.

## 2017-12-30 ENCOUNTER — Encounter: Payer: BLUE CROSS/BLUE SHIELD | Attending: Obstetrics and Gynecology | Admitting: *Deleted

## 2017-12-30 ENCOUNTER — Ambulatory Visit: Payer: BLUE CROSS/BLUE SHIELD | Admitting: *Deleted

## 2017-12-30 ENCOUNTER — Other Ambulatory Visit: Payer: Self-pay | Admitting: *Deleted

## 2017-12-30 DIAGNOSIS — O24111 Pre-existing diabetes mellitus, type 2, in pregnancy, first trimester: Secondary | ICD-10-CM

## 2017-12-30 DIAGNOSIS — O24319 Unspecified pre-existing diabetes mellitus in pregnancy, unspecified trimester: Secondary | ICD-10-CM

## 2017-12-30 MED ORDER — ACCU-CHEK FASTCLIX LANCETS MISC: 1.0000 | Freq: Four times a day (QID) | 12 refills | Status: DC

## 2017-12-30 MED ORDER — GLUCOSE BLOOD VI STRP
ORAL_STRIP | 12 refills | Status: DC
Start: 2017-12-30 — End: 2021-03-25

## 2017-12-30 MED ORDER — ACCU-CHEK GUIDE ME W/DEVICE KIT: 1.0000 | PACK | Freq: Once | 0 refills | Status: AC

## 2017-12-30 NOTE — Progress Notes (Signed)
Patient was seen on 12/30/2017 for type 2 Diabetes and pregnancy self-management. 5w, 3d,  EDD 08/29/2018. Patient states history of this diabetes for 10 years, since she was 27 years old.   She states history of extreme weight loss resulting in being hospitalized for anorexia several years ago. She states she has been Rx'd Metformin for many years but when she lost the weight, she didn't need it anymore. She states her Dad passed away last year and she had gained some weight back, but was taking Metformin sporadically. She has a meter that is about 25 years old, will need a new one. . Diet history obtained. Patient eats good variety of all food groups and beverages include water and occasional diet soda. Patient is currently on Metformin diabetes medications.  The following learning objectives were met by the patient :   States the definition of type 2 Diabetes and pregnancy   States why dietary management is important in controlling blood glucose  Describes the effects of carbohydrates on blood glucose levels  Demonstrates ability to create a balanced meal plan  Demonstrates carbohydrate counting   States when to check blood glucose levels  Demonstrates proper blood glucose monitoring techniques  States the effect of stress and exercise on blood glucose levels  States the importance of limiting caffeine and abstaining from alcohol and smoking  Plan:   Aim for 3 Carb Choices per meal (45 grams) +/- 1 either way   Aim for 1-2 Carbs per snack  Begin reading food labels for Total Carbohydrate of foods  Consider  increasing your activity level by walking or other activity daily as tolerated  Begin checking BG before breakfast and 2 hours after first bite of breakfast, lunch and dinner as directed by MD   Bring Log Book/Sheet to every medical appointment  OR  Patient was introduced to [employer], she plans to use it to record BG electronically   Take medication as directed by  MD  Blood glucose monitor Rx called into pharmacy: Accu Check Guide with Fast Clix drums Patient instructed to test pre breakfast and 2 hours each meal as directed by MD  Patient instructed to monitor glucose levels: FBS: 60 - 95 mg/dl 2 hour: <120 mg/dl  Patient received the following handouts:  Nutrition Diabetes and Pregnancy  Carbohydrate Counting List  Patient will be seen for follow-up in 6 weeks, (2 weeks after her initial OB visit at 10 weeks) and as needed.

## 2018-01-03 ENCOUNTER — Encounter: Payer: Self-pay | Admitting: Obstetrics & Gynecology

## 2018-01-05 ENCOUNTER — Other Ambulatory Visit: Payer: Self-pay

## 2018-01-05 ENCOUNTER — Encounter: Payer: Self-pay | Admitting: Family Medicine

## 2018-01-05 ENCOUNTER — Inpatient Hospital Stay (HOSPITAL_COMMUNITY)
Admission: AD | Admit: 2018-01-05 | Discharge: 2018-01-05 | Disposition: A | Discharge status: Home / Self Care | Payer: BLUE CROSS/BLUE SHIELD | Source: Ambulatory Visit | Attending: Obstetrics & Gynecology | Admitting: Obstetrics & Gynecology

## 2018-01-05 ENCOUNTER — Encounter (HOSPITAL_COMMUNITY): Payer: Self-pay

## 2018-01-05 DIAGNOSIS — O99281 Endocrine, nutritional and metabolic diseases complicating pregnancy, first trimester: Secondary | ICD-10-CM

## 2018-01-05 DIAGNOSIS — O210 Mild hyperemesis gravidarum: Secondary | ICD-10-CM | POA: Insufficient documentation

## 2018-01-05 DIAGNOSIS — E639 Nutritional deficiency, unspecified: Secondary | ICD-10-CM | POA: Diagnosis not present

## 2018-01-05 DIAGNOSIS — Z3A01 Less than 8 weeks gestation of pregnancy: Secondary | ICD-10-CM | POA: Insufficient documentation

## 2018-01-05 DIAGNOSIS — O24111 Pre-existing diabetes mellitus, type 2, in pregnancy, first trimester: Secondary | ICD-10-CM | POA: Diagnosis not present

## 2018-01-05 DIAGNOSIS — E119 Type 2 diabetes mellitus without complications: Secondary | ICD-10-CM | POA: Insufficient documentation

## 2018-01-05 DIAGNOSIS — Z87891 Personal history of nicotine dependence: Secondary | ICD-10-CM | POA: Diagnosis not present

## 2018-01-05 DIAGNOSIS — Z7984 Long term (current) use of oral hypoglycemic drugs: Secondary | ICD-10-CM | POA: Diagnosis not present

## 2018-01-05 DIAGNOSIS — R42 Dizziness and giddiness: Secondary | ICD-10-CM | POA: Diagnosis present

## 2018-01-05 LAB — URINALYSIS, ROUTINE W REFLEX MICROSCOPIC
BILIRUBIN URINE: NEGATIVE
Glucose, UA: NEGATIVE mg/dL
Hgb urine dipstick: NEGATIVE
KETONES UR: 5 mg/dL — AB
LEUKOCYTES UA: NEGATIVE
NITRITE: NEGATIVE
PH: 6 (ref 5.0–8.0)
Protein, ur: NEGATIVE mg/dL
Specific Gravity, Urine: 1.011 (ref 1.005–1.030)

## 2018-01-05 LAB — COMPREHENSIVE METABOLIC PANEL
ALK PHOS: 40 U/L (ref 38–126)
ALT: 37 U/L (ref 0–44)
AST: 22 U/L (ref 15–41)
Albumin: 4.2 g/dL (ref 3.5–5.0)
Anion gap: 10 (ref 5–15)
BUN: 8 mg/dL (ref 6–20)
CHLORIDE: 103 mmol/L (ref 98–111)
CO2: 21 mmol/L — AB (ref 22–32)
CREATININE: 0.46 mg/dL (ref 0.44–1.00)
Calcium: 9.4 mg/dL (ref 8.9–10.3)
GFR calc Af Amer: >60 mL/min (ref >60)
Glucose, Bld: 110 mg/dL — ABNORMAL HIGH (ref 70–99)
Potassium: 3.6 mmol/L (ref 3.5–5.1)
SODIUM: 134 mmol/L — AB (ref 135–145)
Total Bilirubin: 0.6 mg/dL (ref 0.3–1.2)
Total Protein: 7.8 g/dL (ref 6.5–8.1)

## 2018-01-05 LAB — CBC
HCT: 44.8 % (ref 36.0–46.0)
HEMOGLOBIN: 15.5 g/dL — AB (ref 12.0–15.0)
MCH: 32.6 pg (ref 26.0–34.0)
MCHC: 34.6 g/dL (ref 30.0–36.0)
MCV: 94.3 fL (ref 80.0–100.0)
NRBC: 0 % (ref 0.0–0.2)
Platelets: 244 10*3/uL (ref 150–400)
RBC: 4.75 MIL/uL (ref 3.87–5.11)
RDW: 12 % (ref 11.5–15.5)
WBC: 10.8 10*3/uL — ABNORMAL HIGH (ref 4.0–10.5)

## 2018-01-05 LAB — GLUCOSE, CAPILLARY: Glucose-Capillary: 105 mg/dL — ABNORMAL HIGH (ref 70–99)

## 2018-01-05 MED ORDER — DOXYLAMINE-PYRIDOXINE 10-10 MG PO TBEC: 2.0000 | DELAYED_RELEASE_TABLET | Freq: Every day | ORAL | 1 refills | Status: DC

## 2018-01-05 NOTE — [facility] Provider Note (Signed)
History     CSN: [id number]  Arrival date and time: 01/05/18 1329   First Provider Initiated Contact with Patient 01/05/18 1442      Chief Complaint  Patient presents with  . Nausea  . Dizziness   G1 _0  here with dizziness when she woke this am. Sx worsened with standing. Denies HA, LOC, SOB, CP, abd pain, or VB. Endorses daily nausea w/o vomiting for a few weeks. States she is drinking well but did not eat today. She recently started consistently checking her BS and taking Metformin 500 bid but took a third dose midday yesterday. She is also reducing tobacco use in hopes to stop completely.    OB History    Gravida  1   Para  0   Term  0   Preterm  0   AB  0   Living        SAB  0   TAB  0   Ectopic  0   Multiple      Live Births              Past Medical History:  Diagnosis Date  . Anorexia nervosa with bulimia    just finished treatment in August 2012 for this  . Anxiety   . Bartholin's cyst   . Depression   . Diabetes mellitus    not currently treated for this. Pt states she was told she had DM, but lost @  150 lbs in 9 months.  . Diabetes mellitus without complication ([place])   . Obesity   . PTSD (post-traumatic stress disorder)     Past Surgical History:  Procedure Laterality Date  . TONSILLECTOMY    . WISDOM TOOTH EXTRACTION      Family History  Problem Relation Age of Onset  . Diabetes Mother     Social History   Tobacco Use  . Smoking status: Former Smoker    Packs/day: 0.50    Types: Cigarettes  . Smokeless tobacco: Never Used  . Tobacco comment: rarely smokes  Substance Use Topics  . Alcohol use: Yes    Comment: occasional drinker - 2 or 3 times a week  . Drug use: No    Allergies:  Allergies  Allergen Reactions  . Quetiapine Itching  . Sulfa Antibiotics Nausea Only  . Sulfa Antibiotics Nausea Only    Medications Prior to Admission  Medication Sig Dispense Refill Last Dose  . calcium carbonate (TUMS - DOSED  IN MG ELEMENTAL CALCIUM) 500 MG chewable tablet Chew 1 tablet by mouth 4 (four) times daily as needed for indigestion or heartburn.   01/04/2018 at Unknown time  . folic acid (FOLVITE) 482 MCG tablet Take 400 mcg by mouth daily.   01/05/2018 at Unknown time  . metFORMIN (GLUCOPHAGE) 500 MG tablet Take 500 mg by mouth 2 (two) times daily with a meal.   01/04/2018 at Unknown time  . Prenatal Vit-Fe Fumarate-FA (PRENATAL MULTIVITAMIN) TABS tablet Take 1 tablet by mouth daily at 12 noon.   01/05/2018 at Unknown time  . ACCU-CHEK FASTCLIX LANCETS MISC 1 Device by Percutaneous route 4 (four) times daily. 100 each 12   . glucose blood test strip Check blood glucose four times per day 100 each 12   . naloxone (NARCAN) nasal spray 4 mg/0.1 mL Use as directed in overdose. 1 kit 1 Emergency    Review of Systems  Constitutional: Negative for fever.  HENT: Negative for congestion and ear pain.   Respiratory: Negative  for cough.   Gastrointestinal: Positive for nausea and vomiting. Negative for abdominal pain, constipation and diarrhea.  Genitourinary: Negative for vaginal bleeding.  Neurological: Positive for dizziness. Negative for syncope.   Physical Exam   Blood pressure 127/61, pulse 81, temperature 97.7 F (36.5 C), temperature source Oral, resp. rate 20, height _0  (1.702 m), weight 125.9 kg, last menstrual period 11/22/2017, SpO2 98 %.  Physical Exam  Nursing note and vitals reviewed. Constitutional: She is oriented to person, place, and time. She appears well-developed and well-nourished. No distress.  HENT:  Head: Normocephalic and atraumatic.  Right Ear: Hearing, tympanic membrane, external ear and ear canal normal.  Left Ear: Hearing, tympanic membrane, external ear and ear canal normal.  Neck: Normal range of motion.  Cardiovascular: Normal rate.  Respiratory: Effort normal. No respiratory distress.  Musculoskeletal: Normal range of motion.  Neurological: She is alert and oriented  to person, place, and time.  Skin: Skin is warm and dry.  Psychiatric: She has a normal mood and affect.   Results for orders placed or performed during the hospital encounter of 01/05/18 (from the past 24 hour(s))  CBC     Status: Abnormal   Collection Time: 01/05/18  3:15 PM  Result Value Ref Range   WBC 10.8 (H) 4.0 - 10.5 K/uL   RBC 4.75 3.87 - 5.11 MIL/uL   Hemoglobin 15.5 (H) 12.0 - 15.0 g/dL   HCT 44.8 36.0 - 46.0 %   MCV 94.3 80.0 - 100.0 fL   MCH 32.6 26.0 - 34.0 pg   MCHC 34.6 30.0 - 36.0 g/dL   RDW 12.0 11.5 - 15.5 %   Platelets 244 150 - 400 K/uL   nRBC 0.0 0.0 - 0.2 %  Comprehensive metabolic panel     Status: Abnormal   Collection Time: 01/05/18  3:15 PM  Result Value Ref Range   Sodium 134 (L) 135 - 145 mmol/L   Potassium 3.6 3.5 - 5.1 mmol/L   Chloride 103 98 - 111 mmol/L   CO2 21 (L) 22 - 32 mmol/L   Glucose, Bld 110 (H) 70 - 99 mg/dL   BUN 8 6 - 20 mg/dL   Creatinine, Ser 0.46 0.44 - 1.00 mg/dL   Calcium 9.4 8.9 - 10.3 mg/dL   Total Protein 7.8 6.5 - 8.1 g/dL   Albumin 4.2 3.5 - 5.0 g/dL   AST 22 15 - 41 U/L   ALT 37 0 - 44 U/L   Alkaline Phosphatase 40 38 - 126 U/L   Total Bilirubin 0.6 0.3 - 1.2 mg/dL   GFR calc non Af Amer >60 >60 mL/min   GFR calc Af Amer >60 >60 mL/min   Anion gap 10 5 - 15  Glucose, capillary     Status: Abnormal   Collection Time: 01/05/18  3:15 PM  Result Value Ref Range   Glucose-Capillary 105 (H) 70 - 99 mg/dL  Urinalysis, Routine w reflex microscopic     Status: Abnormal   Collection Time: 01/05/18  3:27 PM  Result Value Ref Range   Color, Urine YELLOW YELLOW   APPearance CLEAR CLEAR   Specific Gravity, Urine 1.011 1.005 - 1.030   pH 6.0 5.0 - 8.0   Glucose, UA NEGATIVE NEGATIVE mg/dL   Hgb urine dipstick NEGATIVE NEGATIVE   Bilirubin Urine NEGATIVE NEGATIVE   Ketones, ur 5 (A) NEGATIVE mg/dL   Protein, ur NEGATIVE NEGATIVE mg/dL   Nitrite NEGATIVE NEGATIVE   Leukocytes, UA NEGATIVE NEGATIVE   MAU  Course   Procedures  MDM Labs ordered and reviewed. Sx likely d/t poor po intake today, hypoglycemia episode, or poor hydration. Will treat with Diclegis. Stable for discharge home.   Assessment and Plan   1. [redacted] weeks gestation of pregnancy   2. Pre-existing type 2 diabetes mellitus during pregnancy in first trimester   3. Poor nutritional intake affecting pregnancy in first trimester   4. Dizziness   5. Morning sickness    Discharge home Follow up in OB office as scheduled Rx Diclegis Hydrate Eat often  Allergies as of 01/05/2018      Reactions   Quetiapine Itching   Sulfa Antibiotics Nausea Only   Sulfa Antibiotics Nausea Only      Medication List    TAKE these medications   ACCU-CHEK FASTCLIX LANCETS Misc 1 Device by Percutaneous route 4 (four) times daily.   calcium carbonate 500 MG chewable tablet Commonly known as:  TUMS - dosed in mg elemental calcium Chew 1 tablet by mouth 4 (four) times daily as needed for indigestion or heartburn.   Doxylamine-Pyridoxine 10-10 MG Tbec Take 2 tablets by mouth at bedtime. May also use 1 tab in am and 1 tab in afternoon   folic acid 638 MCG tablet Commonly known as:  FOLVITE Take 400 mcg by mouth daily.   glucose blood test strip Check blood glucose four times per day   metFORMIN 500 MG tablet Commonly known as:  GLUCOPHAGE Take 500 mg by mouth 2 (two) times daily with a meal.   naloxone 4 MG/0.1ML Liqd nasal spray kit Commonly known as:  NARCAN Use as directed in overdose.   prenatal multivitamin Tabs tablet Take 1 tablet by mouth daily at 12 noon.      Julianne Handler, CNM 01/05/2018, 3:04 PM

## 2018-01-05 NOTE — Discharge Instructions (Signed)
Eating Plan for Pregnant Women While you are pregnant, your body will require additional nutrition to help support your growing baby. It is recommended that you consume:  150 additional calories each day during your first trimester.  300 additional calories each day during your second trimester.  300 additional calories each day during your third trimester.  Eating a healthy, well-balanced diet is very important for your health and for your baby's health. You also have a higher need for some vitamins and minerals, such as folic acid, calcium, iron, and vitamin D. What do I need to know about eating during pregnancy?  Do not try to lose weight or go on a diet during pregnancy.  Choose healthy, nutritious foods. Choose  of a sandwich with a glass of milk instead of a candy bar or a high-calorie sugar-sweetened beverage.  Limit your overall intake of foods that have "empty calories." These are foods that have little nutritional value, such as sweets, desserts, candies, sugar-sweetened beverages, and fried foods.  Eat a variety of foods, especially fruits and vegetables.  Take a prenatal vitamin to help meet the additional needs during pregnancy, specifically for folic acid, iron, calcium, and vitamin D.  Remember to stay active. Ask your health care provider for exercise recommendations that are specific to you.  Practice good food safety and cleanliness, such as washing your hands before you eat and after you prepare raw meat. This helps to prevent foodborne illnesses, such as listeriosis, that can be very dangerous for your baby. Ask your health care provider for more information about listeriosis. What does 150 extra calories look like? Healthy options for an additional 150 calories each day could be any of the following:  Plain low-fat yogurt (6-8 oz) with  cup of berries.  1 apple with 2 teaspoons of peanut butter.  Cut-up vegetables with  cup of hummus.  Low-fat chocolate milk  (8 oz or 1 cup).  1 string cheese with 1 medium orange.   of a peanut butter and jelly sandwich on whole-wheat bread (1 tsp of peanut butter).  For 300 calories, you could eat two of those healthy options each day. What is a healthy amount of weight to gain? The recommended amount of weight for you to gain is based on your pre-pregnancy BMI. If your pre-pregnancy BMI was:  Less than 18 (underweight), you should gain 28-40 lb.  18-24.9 (normal), you should gain 25-35 lb.  25-29.9 (overweight), you should gain 15-25 lb.  Greater than 30 (obese), you should gain 11-20 lb.  What if I am having twins or multiples? Generally, pregnant women who will be having twins or multiples may need to increase their daily calories by 300-600 calories each day. The recommended range for total weight gain is 25-54 lb, depending on your pre-pregnancy BMI. Talk with your health care provider for specific guidance about additional nutritional needs, weight gain, and exercise during your pregnancy. What foods can I eat? Grains Any grains. Try to choose whole grains, such as whole-wheat bread, oatmeal, or brown rice. Vegetables Any vegetables. Try to eat a variety of colors and types of vegetables to get a full range of vitamins and minerals. Remember to wash your vegetables well before eating. Fruits Any fruits. Try to eat a variety of colors and types of fruit to get a full range of vitamins and minerals. Remember to wash your fruits well before eating. Meats and Other Protein Sources Lean meats, including chicken, [place], fish, and lean cuts of beef, veal,  or pork. Make sure that all meats are cooked to "well done." Tofu. Tempeh. Beans. Eggs. Peanut butter and other nut butters. Seafood, such as shrimp, crab, and lobster. If you choose fish, select types that are higher in omega-3 fatty acids, including salmon, herring, mussels, trout, sardines, and pollock. Make sure that all meats are cooked to food-safe  temperatures. Dairy Pasteurized milk and milk alternatives. Pasteurized yogurt and pasteurized cheese. Cottage cheese. Sour cream. Beverages Water. Juices that contain 100% fruit juice or vegetable juice. Caffeine-free teas and decaffeinated coffee. Drinks that contain caffeine are okay to drink, but it is better to avoid caffeine. Keep your total caffeine intake to less than 200 mg each day (12 oz of coffee, tea, or soda) or as directed by your health care provider. Condiments Any pasteurized condiments. Sweets and Desserts Any sweets and desserts. Fats and Oils Any fats and oils. The items listed above may not be a complete list of recommended foods or beverages. Contact your dietitian for more options. What foods are not recommended? Vegetables Unpasteurized (raw) vegetable juices. Fruits Unpasteurized (raw) fruit juices. Meats and Other Protein Sources Cured meats that have nitrates, such as bacon, salami, and hotdogs. Luncheon meats, bologna, or other deli meats (unless they are reheated until they are steaming hot). Refrigerated pate, meat spreads from a meat counter, smoked seafood that is found in the refrigerated section of a store. Raw fish, such as sushi or sashimi. High mercury content fish, such as tilefish, shark, swordfish, and king mackerel. Raw meats, such as tuna or beef tartare. Undercooked meats and poultry. Make sure that all meats are cooked to food-safe temperatures. Dairy Unpasteurized (raw) milk and any foods that have raw milk in them. Soft cheeses, such as feta, queso blanco, queso fresco, Brie, Camembert cheeses, blue-veined cheeses, and Panela cheese (unless it is made with pasteurized milk, which must be stated on the label). Beverages Alcohol. Sugar-sweetened beverages, such as sodas, teas, or energy drinks. Condiments Homemade fermented foods and drinks, such as pickles, sauerkraut, or kombucha drinks. (Store-bought pasteurized versions of these are  okay.) Other Salads that are made in the store, such as ham salad, chicken salad, egg salad, tuna salad, and seafood salad. The items listed above may not be a complete list of foods and beverages to avoid. Contact your dietitian for more information. This information is not intended to replace advice given to you by your health care provider. Make sure you discuss any questions you have with your health care provider. Document Released: 11/17/2013 Document Revised: 07/11/2015 Document Reviewed: 07/18/2013 Elsevier Interactive Patient Education  2018 [employer].   Morning Sickness Morning sickness is when you feel sick to your stomach (nauseous) during pregnancy. You may feel sick to your stomach and throw up (vomit). You may feel sick in the morning, but you can feel this way any time of day. Some women feel very sick to their stomach and cannot stop throwing up (hyperemesis gravidarum). Follow these instructions at home:  Only take medicines as told by your doctor.  Take multivitamins as told by your doctor. Taking multivitamins before getting pregnant can stop or lessen the harshness of morning sickness.  Eat dry toast or unsalted crackers before getting out of bed.  Eat 5 to 6 small meals a day.  Eat dry and bland foods like rice and baked potatoes.  Do not drink liquids with meals. Drink between meals.  Do not eat greasy, fatty, or spicy foods.  Have someone cook for you if  the smell of food causes you to feel sick or throw up.  If you feel sick to your stomach after taking prenatal vitamins, take them at night or with a snack.  Eat protein when you need a snack (nuts, yogurt, cheese).  Eat unsweetened gelatins for dessert.  Wear a bracelet used for sea sickness (acupressure wristband).  Go to a doctor that puts thin needles into certain body points (acupuncture) to improve how you feel.  Do not smoke.  Use a humidifier to keep the air in your house free of  odors.  Get lots of fresh air. Contact a doctor if:  You need medicine to feel better.  You feel dizzy or lightheaded.  You are losing weight. Get help right away if:  You feel very sick to your stomach and cannot stop throwing up.  You pass out (faint). This information is not intended to replace advice given to you by your health care provider. Make sure you discuss any questions you have with your health care provider. Document Released: 03/12/2004 Document Revised: 07/11/2015 Document Reviewed: 07/20/2012 Elsevier Interactive Patient Education  2017 [employer]

## 2018-01-05 NOTE — MAU Provider Note (Addendum)
History     CSN: [id number]  Arrival date and time: 01/05/18 1329   First Provider Initiated Contact with Patient 01/05/18 1442      Chief Complaint  Patient presents with  . Nausea  . Dizziness   Crystal Castillo is a 27 y/o woman G1P0 at 44w2dwith a PMHx of T2DM, MDD, polysubstance use disorder who presented to the MAU today with new onset dizziness upon awakening this morning. She describes her dizziness as being "like the room is spinning" and worsened with standing. No associated HA, LOC, SOB, chest pain, lightheadedness, abdominal pain, or vaginal bleeding. She has been experiencing intermittent nausea without emesis over the past few weeks with associated decreased appetite. Pt states that she constantly carries a water bottle with her to ensure she is drinking plenty of fluids and water throughout the day. No changes in urine output.  She was recently seen by a dietician 12/30/17 to discuss diabetes management and received a new blood glucose meter to check her sugars at home. Currently takes metformin 500 mg BID with meals. She notes that she measured her BG yesterday and it was high, so she took an additional dose of metformin in the afternoon. Her HgbA1c most recently checked by her PCP 12/20/17 was 7.5 (vs 10.5 in June). Pt notes that she checker her BG levels earlier today. Fasting BG upon awakening was ~140, and BG was 121 prior to arrival to MAU. She has not eaten today but has continued to drink water for hydration. Pt also reports that she has not taken any metformin today since she has not eaten anything.  [facility].   OB History    Gravida  1   Para  0   Term  0   Preterm  0   AB  0   Living        SAB  0   TAB  0   Ectopic  0   Multiple      Live Births              Past Medical History:  Diagnosis Date  . Anorexia nervosa with bulimia    just finished treatment in August 2012 for  this  . Anxiety   . Bartholin's cyst   . Depression   . Diabetes mellitus    not currently treated for this. Pt states she was told she had DM, but lost @  150 lbs in 9 months.  . Diabetes mellitus without complication ([place]   . Obesity   . PTSD (post-traumatic stress disorder)     Past Surgical History:  Procedure Laterality Date  . TONSILLECTOMY    . WISDOM TOOTH EXTRACTION      Family History  Problem Relation Age of Onset  . Diabetes Mother     Social History   Tobacco Use  . Smoking status: Former Smoker    Packs/day: 0.50    Types: Cigarettes  . Smokeless tobacco: Never Used  . Tobacco comment: rarely smokes  Substance Use Topics  . Alcohol use: Yes    Comment: occasional drinker - 2 or 3 times a week  . Drug use: No    Allergies:  Allergies  Allergen Reactions  . Quetiapine Itching  . Sulfa Antibiotics Nausea Only  . Sulfa Antibiotics Nausea Only    Medications Prior to Admission  Medication Sig Dispense Refill Last Dose  . calcium carbonate (TUMS - DOSED  IN MG ELEMENTAL CALCIUM) 500 MG chewable tablet Chew 1 tablet by mouth 4 (four) times daily as needed for indigestion or heartburn.   01/04/2018 at Unknown time  . folic acid (FOLVITE) 809 MCG tablet Take 400 mcg by mouth daily.   01/05/2018 at Unknown time  . metFORMIN (GLUCOPHAGE) 500 MG tablet Take 500 mg by mouth 2 (two) times daily with a meal.   01/04/2018 at Unknown time  . Prenatal Vit-Fe Fumarate-FA (PRENATAL MULTIVITAMIN) TABS tablet Take 1 tablet by mouth daily at 12 noon.   01/05/2018 at Unknown time  . ACCU-CHEK FASTCLIX LANCETS MISC 1 Device by Percutaneous route 4 (four) times daily. 100 each 12   . glucose blood test strip Check blood glucose four times per day 100 each 12   . naloxone (NARCAN) nasal spray 4 mg/0.1 mL Use as directed in overdose. 1 kit 1 Emergency    Review of Systems  Constitutional: Positive for appetite change and fatigue. Negative for chills and fever.        Decreased appetite over past few weeks 2/2 intermittent nausea  Respiratory: Negative for shortness of breath.   Cardiovascular: Negative for chest pain.  Gastrointestinal: Positive for nausea. Negative for abdominal pain and vomiting.  Endocrine: Negative for polydipsia and polyuria.  Genitourinary: Negative for decreased urine volume, difficulty urinating, dysuria, frequency, pelvic pain, urgency and vaginal bleeding.  Neurological: Positive for dizziness. Negative for syncope, light-headedness and headaches.   Physical Exam   Blood pressure 127/61, pulse 81, temperature 97.7 F (36.5 C), temperature source Oral, resp. rate 20, height _0  (1.702 m), weight 125.9 kg, last menstrual period 11/22/2017, SpO2 98 %.  Physical Exam  Constitutional: She is oriented to person, place, and time. She appears well-developed. No distress.  Cardiovascular: Normal rate, regular rhythm and normal heart sounds.  No murmur heard. Respiratory: Effort normal and breath sounds normal. She has no wheezes. She has no rales. She exhibits no tenderness.  GI: She exhibits no distension. There is no tenderness.  Neurological: She is alert and oriented to person, place, and time.  Skin: She is not diaphoretic.  Psychiatric: She has a normal mood and affect. Her behavior is normal. Judgment and thought content normal.    MAU Course  Procedures  MDM 1508: Pt evaluated. Labs ordered to assess for possible hypoglycemia vs DKA vs dehydration 2/2 decreased PO intake vs upper respiratory infection related symptoms vs BPPV. UA, CMP, CBC, and orthostatic BP readings pending.   1615: Orthostatic BP readings WNL. Given normal vital signs and no changes with orthostatic measurements, less c/f dehydration. CMP indicates normal anion gap and no significant electrolyte abnormalities. Capillary glucose mildly elevated 105. UA indicates small presence of ketones with negative nitrites and leukocytes. Due to lack of elevated  anion gap, less concerned pt is experiencing DKA. Since pt's symptoms are not positional, less c/f BPPV. Lack of recent upper respiratory symptoms and normal HEENT exam less c/f upper respiratory etiology.  Assessment and Plan  Diamantina Justus is a 27 y/o woman G1P0 at 40w2dwith a PMHx of T2DM, MDD, and polysubstance use disorder who presented to MAU following new onset dizziness with associated several week hx of intermittent nausea c/w morning sickness and associated fluctuating blood glucose levels supported by pt's history of poorly controlled blood glucose levels and taking an extra dose of metformin yesterday. -- Continue metformin 500 mg BID -- Keep hydrated -- Prescription for anti-emetic provided -- [facility] -- Return  for reevaluation if symptoms worsen or change  Delane Ginger 01/05/2018, 2:55 PM

## 2018-01-05 NOTE — MAU Note (Signed)
Presents with c/o dizziness since she woke up this morning.  Reports feels like things are "spinning'.  Denies passing out.

## 2018-01-10 ENCOUNTER — Telehealth: Payer: Self-pay | Admitting: Family Medicine

## 2018-01-10 ENCOUNTER — Encounter: Payer: Self-pay | Admitting: General Practice

## 2018-01-10 NOTE — Progress Notes (Signed)
Patient triggered in BabyScripts for elevated blood sugars. Per chart review, patient isn't scheduled for new OB visit until 12/31. Will forward to Dr Shawnie PonsPratt for review

## 2018-01-10 NOTE — Progress Notes (Signed)
Per Dr Shawnie PonsPratt, OK needs an insulin start--move up her New OB to next week please and schedule with Bev.--may try doublin her metformin to 1000 bid if tolerates.  Will forward to front office.

## 2018-01-11 NOTE — Telephone Encounter (Signed)
Patient was called .

## 2018-01-17 ENCOUNTER — Telehealth: Payer: Self-pay | Admitting: General Practice

## 2018-01-17 NOTE — Telephone Encounter (Signed)
Called patient to discuss metformin dosing. Patient states she was taking metformin 500mg  BID but 2-3 weeks ago she noticed her blood sugar was increased so she increased her dose to 1000mg  BID. Discussed upcoming appts with patient and that a doctor reviewing her blood sugars in babyscripts feels she might need insulin injections compared to the metformin. Discussed that is the reason for her appt with Bev and why her office appts moved up. Patient verbalized understanding & was very frustrated. She states her appts have been changed 3-4 times and no one has told her why or called her with the changes. Patient states it doesn't give her a lot of confidence in coming in and it makes her feel like no one knows what is going on around here. Apologized to the patient for the confusion in appt changes and lack of communication. Explained reason for the sooner appt is because the doctor was concerned about her appt being so far out with her elevated blood sugars and wanted her to be seen sooner. Apologized again for lack of clarity to her. Patient verbalized understanding & had no questions.

## 2018-01-20 ENCOUNTER — Other Ambulatory Visit (HOSPITAL_COMMUNITY)
Admission: RE | Admit: 2018-01-20 | Discharge: 2018-01-20 | Disposition: A | Discharge status: Home / Self Care | Payer: BLUE CROSS/BLUE SHIELD | Source: Ambulatory Visit | Attending: Obstetrics & Gynecology | Admitting: Obstetrics & Gynecology

## 2018-01-20 ENCOUNTER — Ambulatory Visit: Payer: BLUE CROSS/BLUE SHIELD | Admitting: Clinical

## 2018-01-20 ENCOUNTER — Other Ambulatory Visit: Payer: Self-pay | Admitting: Family Medicine

## 2018-01-20 ENCOUNTER — Ambulatory Visit: Payer: BLUE CROSS/BLUE SHIELD | Admitting: *Deleted

## 2018-01-20 ENCOUNTER — Encounter: Payer: Self-pay | Admitting: Obstetrics & Gynecology

## 2018-01-20 ENCOUNTER — Telehealth: Payer: Self-pay | Admitting: *Deleted

## 2018-01-20 ENCOUNTER — Ambulatory Visit (INDEPENDENT_AMBULATORY_CARE_PROVIDER_SITE_OTHER): Payer: Medicaid Other | Admitting: Obstetrics & Gynecology

## 2018-01-20 VITALS — BP 125/67 | HR 86 | Wt 279.2 lb

## 2018-01-20 DIAGNOSIS — Z23 Encounter for immunization: Secondary | ICD-10-CM

## 2018-01-20 DIAGNOSIS — O0990 Supervision of high risk pregnancy, unspecified, unspecified trimester: Secondary | ICD-10-CM

## 2018-01-20 DIAGNOSIS — O99210 Obesity complicating pregnancy, unspecified trimester: Secondary | ICD-10-CM

## 2018-01-20 DIAGNOSIS — O24111 Pre-existing diabetes mellitus, type 2, in pregnancy, first trimester: Secondary | ICD-10-CM

## 2018-01-20 DIAGNOSIS — O24911 Unspecified diabetes mellitus in pregnancy, first trimester: Secondary | ICD-10-CM

## 2018-01-20 DIAGNOSIS — O99211 Obesity complicating pregnancy, first trimester: Secondary | ICD-10-CM

## 2018-01-20 DIAGNOSIS — IMO0001 Reserved for inherently not codable concepts without codable children: Secondary | ICD-10-CM

## 2018-01-20 DIAGNOSIS — O0910 Supervision of pregnancy with history of ectopic or molar pregnancy, unspecified trimester: Secondary | ICD-10-CM | POA: Diagnosis present

## 2018-01-20 DIAGNOSIS — Z794 Long term (current) use of insulin: Secondary | ICD-10-CM | POA: Diagnosis not present

## 2018-01-20 DIAGNOSIS — O24319 Unspecified pre-existing diabetes mellitus in pregnancy, unspecified trimester: Secondary | ICD-10-CM | POA: Insufficient documentation

## 2018-01-20 LAB — POCT URINALYSIS DIP (DEVICE)
BILIRUBIN URINE: NEGATIVE
Glucose, UA: NEGATIVE mg/dL
Hgb urine dipstick: NEGATIVE
Ketones, ur: 15 mg/dL — AB
Leukocytes, UA: NEGATIVE
NITRITE: NEGATIVE
PH: 6 (ref 5.0–8.0)
PROTEIN: NEGATIVE mg/dL
Specific Gravity, Urine: 1.02 (ref 1.005–1.030)
UROBILINOGEN UA: 0.2 mg/dL (ref 0.0–1.0)

## 2018-01-20 MED ORDER — INSULIN SYRINGES (DISPOSABLE) U-100 0.5 ML MISC: 3 refills | Status: DC

## 2018-01-20 MED ORDER — METFORMIN HCL 500 MG PO TABS: 500.0000 mg | ORAL_TABLET | Freq: Two times a day (BID) | ORAL | 4 refills | Status: DC

## 2018-01-20 MED ORDER — INSULIN NPH (HUMAN) (ISOPHANE) 100 UNIT/ML ~~LOC~~ SUSP: SUBCUTANEOUS | 3 refills | Status: DC

## 2018-01-20 MED ORDER — OMNIPOD DASH PODS (GEN 4) MISC: 1.0000 [IU] | 11 refills | Status: DC

## 2018-01-20 MED ORDER — ASPIRIN EC 81 MG PO TBEC: 81.0000 mg | DELAYED_RELEASE_TABLET | Freq: Every day | ORAL | 6 refills | Status: DC

## 2018-01-20 NOTE — Progress Notes (Signed)
  Subjective:    Crystal Castillo is being seen today for her first obstetrical visit.  This sort of a  a planned pregnancy. She is at 1142w3d gestation. Her obstetrical history is significant for obesity and long standing DM for about 10 years. Relationship with FOB: significant other, living together. Patient does intend to breast feed. Pregnancy history fully reviewed.  Patient reports no complaints.  Review of Systems:   Review of Systems Unemployed Living with fiance  Objective:     BP 125/67   Pulse 86   Wt 279 lb 3.2 oz (126.6 kg)   LMP 11/22/2017 (Approximate)   BMI 43.73 kg/m  Physical Exam  Exam  Breathing, conversing, and ambulating normally Well nourished, well hydrated White female, no apparent distress Abd- benign, morbidly obese Cervix- nulliparous, normal Heart- rrr without murmur Lungs- CTAB    Assessment:    Pregnancy: G1P0000 Patient Active Problem List   Diagnosis Date Noted  . Obesity in pregnancy 01/20/2018  . Pregnancy and insulin-dependent diabetes mellitus in first trimester ([name]) 01/20/2018  . Supervision of high risk pregnancy, antepartum 01/20/2018  . Alcohol use disorder, moderate, dependence ([name]) 08/19/2016  . Opioid use disorder, moderate, dependence ([name]) 08/19/2016  . Tobacco use disorder 08/19/2016  . Borderline personality disorder ([name]) 08/19/2016  . Benzodiazepine withdrawal ([name]) 08/19/2016  . Major depressive disorder, recurrent severe without psychotic features ([name]) 08/18/2016  . Drug overdose, intentional ([name]) 08/16/2016       Plan:     Initial labs drawn. Prenatal vitamins. Problem list reviewed and updated. Role of ultrasound in pregnancy discussed; fetal survey: requested. Amniocentesis discussed: not indicated. Follow up in 1 weeks. After long discussion with Bev (diabetes coordinator), we will leave her on metformin and add NPH 10 until BID and will get Omnipod asap Flu vaccine today Continue putting sugar values in  Baby Scripts, encouraged getting more values Pap smear with cultures Discussed genetic testing  Allie BossierMyra C Azaryah Heathcock 01/20/2018

## 2018-01-20 NOTE — Telephone Encounter (Signed)
I called Crystal Castillo back and we discussed since she is pregnant with history of diabetes that baby aspirin is recommended to decrease risk of preeclampsia. She voices understanding. I encouraged her to let us know if she has other questions.

## 2018-01-20 NOTE — Progress Notes (Signed)
Patient was seen on 01/20/2018 for type 2 Diabetes and pregnancy self-management follow up visit. 8w, 3d,  EDD 08/29/2018. Patient has been using [employer] and based on elevated fasting and post meal blood sugars, she needs to start on insulin.  She reports history of IV drug abuse as well as anorexia in her past so she is concerned about using syringes, but states she is willing to take insulin for her baby.   I informed her of the Omnipod Dash insulin delivery device as an alternate way to deliver insulin and it is now covered by Medicaid in addition to [employer]. She expressed interest in using Omnipod if available to her. I will pursue on her behalf.   Insulin Instruction  The following learning objectives were met by the patient during this visit:   Insulin Action of analog and NPH insulins  Reviewed syringe & vial VS pen including # units per syringe,    length of needles  Hygiene and storage  Drawing up single and mixed doses if using vials   Single dose   Mixed dose:   Rotation of Sites  Hypoglycemia- symptoms, causes , treatment choices  Record keeping and MD follow up  I reviewed patient's BG from Baby Scripts with Dr. Clovia Cuff and we noted that her FBG have improved since she increased her dose of metformin a couple of weeks ago from 130-148 down to 103-123 this week. So we decided instead of weight based insulin with Humalog and NPH, to start with base dose of NPH twice a day @ 10 units each, with follow up for further adjustment as needed. We will find out later today if we can move to Omnipod. That will require only Humalog insulin in the pods, so there will be a switch from NPH to Humalog with the pharmacy at that time.    Patient demonstrated understanding of insulin administration by return demonstration.  Patient received the following handouts:  Insulin Instruction Handout                                        Patient to start on insulin as  Rx'd by MD @ 10 units NPH before breakfast and before bedtime  Patient to continue to monitor glucose levels: FBS: 60 - 95 mg/dl 2 hour: <120 mg/dl  Patient will be seen for follow-up in 1-2 weeks for review of BG now that she is on insulin and to pursue Omnipod Dash

## 2018-01-20 NOTE — BH Specialist Note (Signed)
Integrated Behavioral Health Initial Visit  MRN: [id number] Name: Crystal Castillo  Number of Integrated Behavioral Health Clinician visits:: 1/6 Session Start time: 9:50  Session End time: 10:00 Total time: 15 minutes  Type of Service: Integrated Behavioral Health- Individual/Family Interpretor:No. Interpretor Name and Language: n/a   Warm Hand Off Completed.       SUBJECTIVE: Crystal Castillo is a 27 y.o. female accompanied by n/a Patient was referred by Nicholaus BloomMyra Dove, MD for Initial OB introduction to integrated behavioral health services . Patient reports the following symptoms/concerns: Pt states no particular concern today. Duration of problem: n/a; Severity of problem: n/a  OBJECTIVE: Mood: Normal and Affect: Appropriate Risk of harm to self or others: No plan to harm self or others  LIFE CONTEXT: Family and Social: Pt lives with FOB School/Work: - Self-Care: - Life Changes: Current pregnancy  GOALS ADDRESSED: Patient will: 1. Increase knowledge and/or ability of: healthy habits   INTERVENTIONS: Interventions utilized: Psychoeducation and/or Health Education  Standardized Assessments completed: None given today  ASSESSMENT: Patient currently experiencing Supervision of high risk pregnancy, antenatal.   Patient may benefit from Initial OB introduction to integrated behavioral health services .  PLAN: 1. Follow up with behavioral health clinician on : As needed 2. Behavioral recommendations:  -Continue taking prenatal vitamin, as recommended by medical provider 3. Referral(s): [facility]) 4. "From scale of 1-10, how likely are you to follow plan?": 10  Rae LipsJamie C Zani Kyllonen, LCSW  Depression screen [facility] 2/9 09/13/2015  Decreased Interest 2  Down, Depressed, Hopeless 2  PHQ - 2 Score 4  Altered sleeping 2  Tired, decreased energy 3  Change in appetite 2  Feeling bad or failure about yourself  2  Trouble concentrating 1  Moving  slowly or fidgety/restless 1  Suicidal thoughts 0  PHQ-9 Score 15   GAD 7 : Generalized Anxiety Score 09/13/2015  Nervous, Anxious, on Edge 3  Control/stop worrying 3  Worry too much - different things 3  Trouble relaxing 2  Restless 1  Easily annoyed or irritable 1  Afraid - awful might happen 0  Total GAD 7 Score 13   '

## 2018-01-20 NOTE — Telephone Encounter (Signed)
Crystal Castillo called and left a voice message this am stating she just left our office and went to pharmacy to pick up her prescriptions and was told by the pharmacist she had a prescription for baby aspirin. States that wasn't told to her and wants to know what it is for.

## 2018-01-20 NOTE — Progress Notes (Signed)
Dating ultrasound scheduled for Monday 01/24/18 @ 0830.  [facility] at [phone] with a full bladder

## 2018-01-21 ENCOUNTER — Encounter: Payer: BLUE CROSS/BLUE SHIELD | Attending: Obstetrics and Gynecology | Admitting: *Deleted

## 2018-01-21 DIAGNOSIS — O24111 Pre-existing diabetes mellitus, type 2, in pregnancy, first trimester: Secondary | ICD-10-CM | POA: Insufficient documentation

## 2018-01-21 DIAGNOSIS — Z713 Dietary counseling and surveillance: Secondary | ICD-10-CM | POA: Insufficient documentation

## 2018-01-21 LAB — CYTOLOGY - PAP
Chlamydia: NEGATIVE
Diagnosis: NEGATIVE
Neisseria Gonorrhea: NEGATIVE

## 2018-01-21 LAB — PROTEIN / CREATININE RATIO, URINE
CREATININE, UR: 145.5 mg/dL
PROTEIN/CREAT RATIO: 98 mg/g{creat} (ref 0–200)
Protein, Ur: 14.3 mg/dL

## 2018-01-23 LAB — URINE CULTURE, OB REFLEX

## 2018-01-23 LAB — CULTURE, OB URINE

## 2018-01-24 ENCOUNTER — Other Ambulatory Visit: Payer: Self-pay | Admitting: General Practice

## 2018-01-24 ENCOUNTER — Ambulatory Visit (INDEPENDENT_AMBULATORY_CARE_PROVIDER_SITE_OTHER): Payer: BLUE CROSS/BLUE SHIELD | Admitting: *Deleted

## 2018-01-24 ENCOUNTER — Ambulatory Visit (HOSPITAL_COMMUNITY)
Admission: RE | Admit: 2018-01-24 | Discharge: 2018-01-24 | Disposition: A | Discharge status: Home / Self Care | Payer: BLUE CROSS/BLUE SHIELD | Source: Ambulatory Visit | Attending: Obstetrics & Gynecology | Admitting: Obstetrics & Gynecology

## 2018-01-24 ENCOUNTER — Encounter: Payer: Self-pay | Admitting: General Practice

## 2018-01-24 ENCOUNTER — Telehealth: Payer: Self-pay | Admitting: *Deleted

## 2018-01-24 DIAGNOSIS — O2341 Unspecified infection of urinary tract in pregnancy, first trimester: Secondary | ICD-10-CM

## 2018-01-24 DIAGNOSIS — O24111 Pre-existing diabetes mellitus, type 2, in pregnancy, first trimester: Secondary | ICD-10-CM

## 2018-01-24 DIAGNOSIS — O0990 Supervision of high risk pregnancy, unspecified, unspecified trimester: Secondary | ICD-10-CM

## 2018-01-24 DIAGNOSIS — Z3687 Encounter for antenatal screening for uncertain dates: Secondary | ICD-10-CM | POA: Insufficient documentation

## 2018-01-24 DIAGNOSIS — Z3A09 9 weeks gestation of pregnancy: Secondary | ICD-10-CM | POA: Diagnosis not present

## 2018-01-24 MED ORDER — AMPICILLIN 250 MG PO CAPS: 250.0000 mg | ORAL_CAPSULE | Freq: Four times a day (QID) | ORAL | 0 refills | Status: DC

## 2018-01-24 NOTE — Progress Notes (Signed)
Here for Crystal Castillo results. Reviewed with Dr.Dove and informed patient Crystal Castillo shows live baby at 3957w1d with EDD 08/28/18. She has already started prenatal care in our office and we reviewed her next appointment this Friday 01/28/18.

## 2018-01-24 NOTE — Telephone Encounter (Signed)
Received a voice mail from 01/21/18 pm stating order not signed for US scheduled for today. Per chart patient already at US this am . Informed Dr.Dove order needed to be signed.

## 2018-01-24 NOTE — Progress Notes (Signed)
Received Babyscripts alert regarding Pts Glucose levels.

## 2018-01-25 ENCOUNTER — Telehealth: Payer: Self-pay | Admitting: *Deleted

## 2018-01-25 ENCOUNTER — Other Ambulatory Visit: Payer: Self-pay

## 2018-01-25 NOTE — Telephone Encounter (Signed)
Received a voice message from [place] at [facility] stating is calling re: RX for [employer] Scientificdrian received yesterday for Ampicillin 250mg  from Dr.Dove. States that it is not covered by her insurance and also he doesn't think they can get 250 mg anymore. Wants to know if she wants to call in a substitution?

## 2018-01-26 MED ORDER — AMOXICILLIN 500 MG PO CAPS: 500.0000 mg | ORAL_CAPSULE | Freq: Three times a day (TID) | ORAL | 0 refills | Status: DC

## 2018-01-26 NOTE — Progress Notes (Signed)
I have reviewed this chart and agree with the RN/CMA assessment and management.    Donyea Gafford C Raju Coppolino, MD, FACOG Attending Physician, [facility]  

## 2018-01-26 NOTE — Telephone Encounter (Signed)
Per Dr. Marice Potterove prescribe Amoxicillin 500 mg cap po tid for seven days.  Rx e-prescribed and sent to [facility].

## 2018-01-27 ENCOUNTER — Telehealth: Payer: Self-pay | Admitting: *Deleted

## 2018-01-27 NOTE — Telephone Encounter (Signed)
01/27/2018  I called patient to follow up with her BG numbers since starting on NPH insulin twice daily. She stated her FBG are running 92-98 mg/dl and post meal BG running a bit high in the 130-150 mg/dl range with one excursion to 170 mg/dl. She states she is able to give the insulin injections fine.  She also reports her Omnipod insulin delivery device arrived today. I explained that it will take Humalog insulin, not NPH and asked if she wanted to use up the NPH before switching to Humalog and starting on the Omnipod? She is willing to do either. She is concerned about the post meal numbers, so I mentioned we could increase the AM dose to help with daytime numbers. She has an appointment with Dr. Marice Potterove tomorrow and they can discuss.   So her NPH can be increased and she can wait until January to [facility] the [facility] or she can be trained sooner. I will have the Omnipod Trainer, Cristal DeerLaura Hite call her to decide. Then Cristal DeerLaura Hite or I will get insulin orders from MD once the start date is determined.

## 2018-01-28 ENCOUNTER — Encounter: Payer: Self-pay | Admitting: Family Medicine

## 2018-01-28 ENCOUNTER — Ambulatory Visit (INDEPENDENT_AMBULATORY_CARE_PROVIDER_SITE_OTHER): Payer: BLUE CROSS/BLUE SHIELD | Admitting: Obstetrics & Gynecology

## 2018-01-28 VITALS — BP 127/67 | HR 80 | Wt 283.9 lb

## 2018-01-28 DIAGNOSIS — O0990 Supervision of high risk pregnancy, unspecified, unspecified trimester: Secondary | ICD-10-CM

## 2018-01-28 DIAGNOSIS — O0991 Supervision of high risk pregnancy, unspecified, first trimester: Secondary | ICD-10-CM

## 2018-01-28 DIAGNOSIS — Z794 Long term (current) use of insulin: Secondary | ICD-10-CM

## 2018-01-28 DIAGNOSIS — O24911 Unspecified diabetes mellitus in pregnancy, first trimester: Secondary | ICD-10-CM

## 2018-01-28 DIAGNOSIS — F332 Major depressive disorder, recurrent severe without psychotic features: Secondary | ICD-10-CM

## 2018-01-28 DIAGNOSIS — IMO0001 Reserved for inherently not codable concepts without codable children: Secondary | ICD-10-CM

## 2018-01-28 LAB — COMPREHENSIVE METABOLIC PANEL
ALBUMIN: 4.4 g/dL (ref 3.5–5.5)
ALK PHOS: 53 IU/L (ref 39–117)
ALT: 459 IU/L — AB (ref 0–32)
AST: 201 IU/L — ABNORMAL HIGH (ref 0–40)
Albumin/Globulin Ratio: 1.8 (ref 1.2–2.2)
BUN / CREAT RATIO: 12 (ref 9–23)
BUN: 6 mg/dL (ref 6–20)
Bilirubin Total: 0.4 mg/dL (ref 0.0–1.2)
CO2: 21 mmol/L (ref 20–29)
CREATININE: 0.49 mg/dL — AB (ref 0.57–1.00)
Calcium: 9.4 mg/dL (ref 8.7–10.2)
Chloride: 99 mmol/L (ref 96–106)
GFR calc Af Amer: 157 mL/min/{1.73_m2} (ref >59)
GFR calc non Af Amer: 136 mL/min/{1.73_m2} (ref >59)
GLUCOSE: 129 mg/dL — AB (ref 65–99)
Globulin, Total: 2.5 g/dL (ref 1.5–4.5)
Potassium: 4 mmol/L (ref 3.5–5.2)
Sodium: 135 mmol/L (ref 134–144)
TOTAL PROTEIN: 6.9 g/dL (ref 6.0–8.5)

## 2018-01-28 LAB — OBSTETRIC PANEL, INCLUDING HIV
ANTIBODY SCREEN: NEGATIVE
BASOS: 1 %
Basophils Absolute: 0 10*3/uL (ref 0.0–0.2)
EOS (ABSOLUTE): 0.1 10*3/uL (ref 0.0–0.4)
EOS: 1 %
HEMATOCRIT: 42 % (ref 34.0–46.6)
HEMOGLOBIN: 14.2 g/dL (ref 11.1–15.9)
HEP B S AG: NEGATIVE
HIV Screen 4th Generation wRfx: NONREACTIVE
IMMATURE GRANS (ABS): 0.1 10*3/uL (ref 0.0–0.1)
Immature Granulocytes: 1 %
Lymphocytes Absolute: 2.2 10*3/uL (ref 0.7–3.1)
Lymphs: 25 %
MCH: 32 pg (ref 26.6–33.0)
MCHC: 33.8 g/dL (ref 31.5–35.7)
MCV: 95 fL (ref 79–97)
MONOCYTES: 7 %
MONOS ABS: 0.6 10*3/uL (ref 0.1–0.9)
NEUTROS PCT: 65 %
Neutrophils Absolute: 5.7 10*3/uL (ref 1.4–7.0)
Platelets: 218 10*3/uL (ref 150–450)
RBC: 4.44 x10E6/uL (ref 3.77–5.28)
RDW: 11.8 % — ABNORMAL LOW (ref 12.3–15.4)
RPR: NONREACTIVE
RUBELLA: 1.66 {index} (ref >0.99)
Rh Factor: POSITIVE
WBC: 8.7 10*3/uL (ref 3.4–10.8)

## 2018-01-28 LAB — SMN1 COPY NUMBER ANALYSIS (SMA CARRIER SCREENING)

## 2018-01-28 LAB — HEMOGLOBINOPATHY EVALUATION
FERRITIN: 115 ng/mL (ref 15–150)
HGB A2 QUANT: 2.1 % (ref 1.8–3.2)
HGB F QUANT: 0 % (ref 0.0–2.0)
HGB SOLUBILITY: NEGATIVE
HGB VARIANT: 0 %
Hgb A: 97.9 % (ref 96.4–98.8)
Hgb C: 0 %
Hgb S: 0 %

## 2018-01-28 LAB — HEMOGLOBIN A1C
ESTIMATED AVERAGE GLUCOSE: 157 mg/dL
Hgb A1c MFr Bld: 7.1 % — ABNORMAL HIGH (ref 4.8–5.6)

## 2018-01-28 LAB — TSH: TSH: 1.5 u[IU]/mL (ref 0.450–4.500)

## 2018-01-28 LAB — CYSTIC FIBROSIS GENE TEST

## 2018-01-28 LAB — GLUCOSE, CAPILLARY: GLUCOSE-CAPILLARY: 104 mg/dL — AB (ref 70–99)

## 2018-01-28 NOTE — Patient Instructions (Signed)
AREA PEDIATRIC/FAMILY [place] [address], [address] [place], [place]  [address] Phone - [phone]   Fax - [phone]  [facility] [address] [place], [place] [address] Phone - [phone]   Fax - [place] [address] [place], [place]  [address] Phone - [phone]   Fax - [phone]  [place] Z[address]hl. [address], [place] [address] [place], [place]  [address] Phone - [phone]   Fax - [phone]  [place] [address] [place], [place]  [address] Phone - [phone]   Fax - [phone]  [facility] [address], [address] [place], [place]  [address] Phone - [phone]   Fax - [place] [address] [place]  [address] Phone - [phone]   Fax - [phone]  [place] [address] [place], [place] [address] [place], [place]  [address] Phone - [phone]   Fax - [place] [address] [place], [place]  [address] Phone - [phone]   Fax - [phone] [facility]. [address] [place], [place]  [address] Phone - [phone]   Fax - [phone]  [facility] [place] [address] [place], [place]  [address] Phone - [phone]   Fax - [phone]  [facility] [place], [place], [place]  [address] Phone - [phone]   Fax - [place] [address] [place], [place]  [address] Phone - [phone]   Fax - [phone]  [facility] [address] [place], [place]  [address] Phone - [place], [place]  [address] Phone - [phone]   Fax - [place] [address] [place], [place]  [address] Phone - [phone]   Fax - [phone]  [place] [address] [place], [place]  [address] Phone - [phone]   Fax - [place], [place]  [address] Phone - [phone]   Fax - [place], [place], [place]  [address] Phone - [phone]   Fax - [place] [address], [place], [place]  [address] Phone - [phone]   Fax - [phone]  [facility] RUBIN [address] [place], [place]  [address] Phone - [phone]   Fax - [place] [address] [place], [place]  [address] Phone - [phone]   Fax - [phone]  [place] [address] [place], [place]  [address] Phone - [phone]   Fax - [phone] [place] [address] [place], [place]  [address] Phone - [phone]   Fax - [place] [address] [place], [place]  [address] Phone - [phone]   Fax - [place] [address] [place], [place]  [address] Phone - [phone]   Fax - [phone]  [place] [address] [place] [address] Phone [phone]    Childbirth Education Options: [facility] Department Classes:  Childbirth education classes can help you get  get ready for a positive parenting experience. You can also meet other expectant parents and get free stuff for your baby. Each class runs for five weeks on the same night and costs $45 for the mother-to-be and her support person. Medicaid covers the cost if you are eligible. Call [phone] to register. [facility] Childbirth Education:  336-832-6682 or 336-832-6848 or  [email or web address]  Baby & Me Class: Discuss newborn & infant parenting and family adjustment issues with other new mothers in a relaxed environment. Each week brings a new speaker or baby-centered activity. We encourage new mothers to join us every Thursday at 11:00am. Babies birth until crawling. No registration or fee. Daddy Boot Camp: This course offers Dads-to-be the tools and knowledge needed to feel confident on their journey to becoming new fathers. Experienced dads, who have been trained as coaches, teach dads-to-be how to hold, comfort, diaper, swaddle and play with their infant while being able to support the new mom as well. A class for men taught by men. $25/dad Big Brother/Big Sister: Let your children share in the joy of a new brother or sister in this special class designed just for them. Class includes discussion about how families care for babies: swaddling, holding, diapering, safety as well as how they can be helpful in their new role. This class is designed for children ages 2 to 6, but any age is welcome. Please register each child individually. $5/child  Mom Talk: This mom-led group offers support and connection to mothers as they journey through the adjustments and struggles of that sometimes overwhelming first year after the birth of a child. Tuesdays at 10:00am and Thursdays at 6:00pm. Babies welcome. No registration or fee. Breastfeeding Support Group: This group is a mother-to-mother support circle where moms have the opportunity to share their breastfeeding experiences. A Lactation Consultant is present for questions and concerns. Meets each Tuesday at 11:00am. No fee or registration. Breastfeeding Your Baby: Learn what to expect in the first days of breastfeeding your newborn.  This class will help you feel more confident with the skills needed to begin your breastfeeding experience. Many new mothers are concerned about breastfeeding after leaving the hospital. This class  will also address the most common fears and challenges about breastfeeding during the first few weeks, months and beyond. (call for fee) Comfort Techniques and Tour: This 2 hour interactive class will provide you the opportunity to learn & practice hands-on techniques that can help relieve some of the discomfort of labor and encourage your baby to rotate toward the best position for birth. You and your partner will be able to try a variety of labor positions with birth balls and rebozos as well as practice breathing, relaxation, and visualization techniques. A tour of the [facility] is included with this class. $20 per registrant and support person Childbirth Class- Weekend Option: This class is a Weekend version of our Birth & Baby series. It is designed for parents who have a difficult time fitting several weeks of classes into their schedule. It covers the care of your newborn and the basics of labor and childbirth. It also includes a Maternity Care Center Tour of [facility] and lunch. The class is held two consecutive days: beginning on Friday evening from 6:30 - 8:30 p.m. and the next day, Saturday from 9 a.m. - 4 p.m. (call for fee) Waterbirth Class: Interested in a waterbirth?  This informational class will help you discover whether waterbirth is the right fit for you.   Education about waterbirth itself, supplies you would need and how to assemble your support team is what you can expect from this class. Some obstetrical practices require this class in order to pursue a waterbirth. (Not all obstetrical practices offer waterbirth-check with your healthcare provider.) Register only the expectant mom, but you are encouraged to bring your partner to class! Required if planning waterbirth, no fee. Infant/Child CPR: Parents, grandparents, babysitters, and friends learn Cardio-Pulmonary Resuscitation skills for infants and children. You will also learn how to treat both conscious  and unconscious choking in infants and children. This Family & Friends program does not offer certification. Register each participant individually to ensure that enough mannequins are available. (Call for fee) Grandparent Love: Expecting a grandbaby? This class is for you! Learn about the latest infant care and safety recommendations and ways to support your own child as he or she transitions into the parenting role. Taught by Registered Nurses who are childbirth instructors, but most importantly...they are grandmothers too! $10/person. Childbirth Class- Natural Childbirth: This series of 5 weekly classes is for expectant parents who want to learn and practice natural methods of coping with the process of labor and childbirth. Relaxation, breathing, massage, visualization, role of the partner, and helpful positioning are highlighted. Participants learn how to be confident in their body's ability to give birth. This class will empower and help parents make informed decisions about their own care. Includes discussion that will help new parents transition into the immediate postpartum period. Maternity Care Center Tour of [facility] is included. We suggest taking this class between 25-32 weeks, but it's only a recommendation. $75 per registrant and one support person or $30 Medicaid. Childbirth Class- 3 week Series: This option of 3 weekly classes helps you and your labor partner prepare for childbirth. Newborn care, labor & birth, cesarean birth, pain management, and comfort techniques are discussed and a Maternity Care Center Tour of [facility] is included. The class meets at the same time, on the same day of the week for 3 consecutive weeks beginning with the starting date you choose. $60 for registrant and one support person.  Marvelous Multiples: Expecting twins, triplets, or more? This class covers the differences in labor, birth, parenting, and breastfeeding issues that face multiples' parents.  NICU tour is included. Led by a Certified Childbirth Educator who is the mother of twins. No fee. Caring for Baby: This class is for expectant and adoptive parents who want to learn and practice the most up-to-date newborn care for their babies. Focus is on birth through the first six weeks of life. Topics include feeding, bathing, diapering, crying, umbilical cord care, circumcision care and safe sleep. Parents learn to recognize symptoms of illness and when to call the pediatrician. Register only the mom-to-be and your partner or support person can plan to come with you! $10 per registrant and support person Childbirth Class- online option: This online class offers you the freedom to complete a Birth and Baby series in the comfort of your own home. The flexibility of this option allows you to review sections at your own pace, at times convenient to you and your support people. It includes additional video information, animations, quizzes, and extended activities. Get organized with helpful eClass tools, checklists, and trackers. Once you register online for the class, you will receive an email within a few days to accept the invitation and begin the class when the time is right for you. The content will be available to you for 60 days. $  60 for 60 days of online access for you and your support people.  Local Doulas: Natural Baby Doulas [email or web address] Tel: [phone] [email or web address] [facility] [phone] [email or web address] The Labor Ladies  (also do waterbirth tub rental) [phone] [email or web address] [facility] [email or web address] [phone] [email or web address] Sacred Rhythms  [phone] [email or web address] [place] [facility] [employer] (PADA) [email or web address] http://www.padanc.org/index.htm [place] [facility]   [email or web address] Considering Waterbirth? Guide for patients at [facility]  Why consider waterbirth?  . Gentle birth for babies . Less pain medicine used in labor . May allow for passive descent/less pushing . May reduce perineal tears  . More mobility and instinctive maternal position changes . Increased maternal relaxation . Reduced blood pressure in labor  Is waterbirth safe? What are the risks of infection, drowning or other complications?  . Infection: o Very low risk (3.7 % for tub vs 4.8% for bed) o 7 in 8000 waterbirths with documented infection o Poorly cleaned equipment most common cause o Slightly lower group B strep transmission rate  . Drowning o Maternal:  - Very low risk   - Related to seizures or fainting o Newborn:  - Very low risk. No evidence of increased risk of respiratory problems in multiple large studies - Physiological protection from breathing under water - Avoid underwater birth if there are any fetal complications - Once baby's head is out of the water, keep it out.  . Birth complication o Some reports of cord trauma, but risk decreased by bringing baby to surface gradually o No evidence of increased risk of shoulder dystocia. Mothers can usually change positions faster in water than in a bed, possibly aiding the maneuvers to free the shoulder.   You must attend a Waterbirth class at [facility]  3rd Wednesday of every month from 7-9pm  Free  Register by calling [phone] or online at [email or web address]  Bring us the certificate from the class to your prenatal appointment  Meet with a midwife at 36 weeks to see if you can still plan a waterbirth and to sign the consent.   Purchase or rent the following supplies:   Water Birth Pool (Birth Pool in a Box or [place] for instance)  (Tubs start ~$125)  Single-use disposable tub liner designed for your brand of tub  New garden hose labeled  "lead-free", "suitable for drinking water",  Electric drain pump to remove water (We recommend 792 gallon per hour or greater pump.)   Separate garden hose to remove the dirty water  Fish net  Bathing suit top (optional)  Long-handled mirror (optional)  Places to purchase or rent supplies  Yourwaterbirth.com for tub purchases and supplies  Waterbirthsolutions.com for tub purchases and supplies  The Labor Ladies (www.[email or web address]) $275 for tub rental/set-up & take down/kit   [place] [facility] [employer] (http://www.padanc.org/MeetUs.htm) Information regarding doulas (labor support) who provide pool rentals  Our practice has a Birth Pool in a Box tub at the hospital that you may borrow on a first-come-first-served basis. It is your responsibility to to set up, clean and break down the tub. We cannot guarantee the availability of this tub in advance. You are responsible for bringing all accessories listed above. If you do not have all necessary supplies you cannot have a waterbirth.    Things that would prevent you from having a waterbirth:  Premature, <37wks  Previous cesarean birth  Presence of thick meconium-stained fluid  Multiple gestation (Twins, triplets, etc.)  Uncontrolled diabetes or gestational diabetes requiring medication  Hypertension requiring medication   diagnosis of pre-eclampsia  Heavy vaginal bleeding  Non-reassuring fetal heart rate  Active infection (MRSA, etc.). Group B Strep is NOT a contraindication for  waterbirth.  If your labor has to be induced and induction method requires continuous  monitoring of the baby's heart rate  Other risks/issues identified by your obstetrical provider  Please remember that birth is unpredictable. Under certain unforeseeable circumstances your provider may advise against giving birth in the tub. These decisions will be made on a case-by-case basis and with the safety of you and your baby as our highest  priority.     Fax [phone]

## 2018-01-28 NOTE — Progress Notes (Signed)
   PRENATAL VISIT NOTE  Subjective:  Crystal Castillo is a 27 y.o. G1P0000 at 2026w4d being seen today for ongoing prenatal care.  She is currently monitored for the following issues for this high-risk pregnancy and has Drug overdose, intentional ([name]); Major depressive disorder, recurrent severe without psychotic features ([name]); Alcohol use disorder, moderate, dependence ([name]); Opioid use disorder, moderate, dependence ([name]); Tobacco use disorder; Borderline personality disorder ([name]); Benzodiazepine withdrawal ([name]); Obesity in pregnancy; Pregnancy and insulin-dependent diabetes mellitus in first trimester [facility]([name]); and Supervision of high risk pregnancy, antepartum on their problem list.  Patient reports no complaints.  Contractions: Not present. Vag. Bleeding: None.  Movement: Absent. Denies leaking of fluid.   The following portions of the patient's history were reviewed and updated as appropriate: allergies, current medications, past family history, past medical history, past social history, past surgical history and problem list. Problem list updated.  Objective:   Vitals:   01/28/18 0938  BP: 127/67  Pulse: 80  Weight: 283 lb 14.4 oz (128.8 kg)    Fetal Status:     Movement: Absent     General:  Alert, oriented and cooperative. Patient is in no acute distress.  Skin: Skin is warm and dry. No rash noted.   Cardiovascular: Normal heart rate noted  Respiratory: Normal respiratory effort, no problems with respiration noted  Abdomen: Soft, gravid, appropriate for gestational age.  Pain/Pressure: Present     Pelvic: Cervical exam deferred        Extremities: Normal range of motion.  Edema: None  Mental Status: Normal mood and affect. Normal behavior. Normal judgment and thought content.   Assessment and Plan:  Pregnancy: G1P0000 at 226w4d  1. Pregnancy and insulin-dependent diabetes mellitus in first trimester ([name]) - Her fastings are generally good with 10 units of NPH at bedtime, the  afternoon and evening sugars are still in the 120-130s so I will increase the AM NPH from 10 to 14. She will start the Omnipod when she uses up her current bottle of NPH. She is taking 500 mg of Metformin BID.  We will stop this.   2. Major depressive disorder, recurrent severe without psychotic features ([name]) - no meds for about a year, doing well  3. Supervision of high risk pregnancy, antepartum   Preterm labor symptoms and general obstetric precautions including but not limited to vaginal bleeding, contractions, leaking of fluid and fetal movement were reviewed in detail with the patient. Please refer to After Visit Summary for other counseling recommendations.  No follow-ups on file.  No future appointments.  Allie BossierMyra C Woodruff Skirvin, MD

## 2018-02-01 ENCOUNTER — Other Ambulatory Visit: Payer: Self-pay

## 2018-02-02 ENCOUNTER — Telehealth: Payer: Self-pay | Admitting: *Deleted

## 2018-02-02 NOTE — Telephone Encounter (Signed)
Patient states she is taking the NPH as prescribed at her last OB visit; 10 units in AM  14 units in PM  She states her FBG are under 100 and post meal BG's are in the 120's.  She states she has over 1/2 of vial left of NPH  I suggested the Omnipod trainer, Cristal DeerLaura Hite, RN, CDE call the patient after Christmas to set up training for the Omnipod, which patient has received, for early January. Patient in agreement with this plan.   She will need Rx for Humalog insulin instead of NPH when this transition takes place.

## 2018-02-04 ENCOUNTER — Ambulatory Visit (INDEPENDENT_AMBULATORY_CARE_PROVIDER_SITE_OTHER): Payer: BLUE CROSS/BLUE SHIELD | Admitting: Obstetrics & Gynecology

## 2018-02-04 VITALS — BP 125/70 | HR 89 | Wt 282.1 lb

## 2018-02-04 DIAGNOSIS — O99210 Obesity complicating pregnancy, unspecified trimester: Secondary | ICD-10-CM

## 2018-02-04 DIAGNOSIS — Z794 Long term (current) use of insulin: Secondary | ICD-10-CM

## 2018-02-04 DIAGNOSIS — O99211 Obesity complicating pregnancy, first trimester: Secondary | ICD-10-CM

## 2018-02-04 DIAGNOSIS — F1120 Opioid dependence, uncomplicated: Secondary | ICD-10-CM

## 2018-02-04 DIAGNOSIS — IMO0001 Reserved for inherently not codable concepts without codable children: Secondary | ICD-10-CM

## 2018-02-04 DIAGNOSIS — O24911 Unspecified diabetes mellitus in pregnancy, first trimester: Secondary | ICD-10-CM

## 2018-02-04 DIAGNOSIS — O0990 Supervision of high risk pregnancy, unspecified, unspecified trimester: Secondary | ICD-10-CM

## 2018-02-04 DIAGNOSIS — F332 Major depressive disorder, recurrent severe without psychotic features: Secondary | ICD-10-CM

## 2018-02-04 DIAGNOSIS — O0991 Supervision of high risk pregnancy, unspecified, first trimester: Secondary | ICD-10-CM

## 2018-02-04 DIAGNOSIS — F603 Borderline personality disorder: Secondary | ICD-10-CM

## 2018-02-04 NOTE — Progress Notes (Signed)
   PRENATAL VISIT NOTE  Subjective:  Crystal Castillo is a 27 y.o. G1P0000 at 8263w4d being seen today for ongoing prenatal care.  She is currently monitored for the following issues for this high-risk pregnancy and has Drug overdose, intentional ([name]); Major depressive disorder, recurrent severe without psychotic features ([name]); Alcohol use disorder, moderate, dependence ([name]); Opioid use disorder, moderate, dependence ([name]); Tobacco use disorder; Borderline personality disorder ([name]); Benzodiazepine withdrawal ([name]); Obesity in pregnancy; Pregnancy and insulin-dependent diabetes mellitus in first trimester [facility]([name]); and Supervision of high risk pregnancy, antepartum on their problem list.  Patient reports no complaints.  Contractions: Not present. Vag. Bleeding: None.  Movement: Absent. Denies leaking of fluid.   The following portions of the patient's history were reviewed and updated as appropriate: allergies, current medications, past family history, past medical history, past social history, past surgical history and problem list. Problem list updated.  Objective:   Vitals:   02/04/18 0929  BP: 125/70  Pulse: 89  Weight: 282 lb 1.6 oz (128 kg)    Fetal Status:     Movement: Absent     General:  Alert, oriented and cooperative. Patient is in no acute distress.  Skin: Skin is warm and dry. No rash noted.   Cardiovascular: Normal heart rate noted  Respiratory: Normal respiratory effort, no problems with respiration noted  Abdomen: Soft, gravid, appropriate for gestational age.  Pain/Pressure: Present     Pelvic: Cervical exam deferred        Extremities: Normal range of motion.  Edema: None  Mental Status: Normal mood and affect. Normal behavior. Normal judgment and thought content.   Assessment and Plan:  Pregnancy: G1P0000 at 5763w4d  1. 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 Major depressive disorder, recurrent severe without psychotic features ([name])   3. Opioid use disorder, moderate, dependence ([name]) - no use for almost 2 years fastings still elevated to will increase NPH from 10 to 12 Will increase am NPH from 14 to 18   4. Borderline personality disorder ([name])   5. Obesity in pregnancy   6. Supervision of high risk pregnancy, antepartum   Preterm labor symptoms and general obstetric precautions including but not limited to vaginal bleeding, contractions, leaking of fluid and fetal movement were reviewed in detail with the patient. Please refer to After Visit Summary for other counseling recommendations.  No follow-ups on file.  No future appointments.  Allie BossierMyra C Trent Theisen, MD

## 2018-02-14 ENCOUNTER — Encounter: Payer: Self-pay | Admitting: Obstetrics and Gynecology

## 2018-02-14 ENCOUNTER — Ambulatory Visit (INDEPENDENT_AMBULATORY_CARE_PROVIDER_SITE_OTHER): Payer: BLUE CROSS/BLUE SHIELD | Admitting: Obstetrics and Gynecology

## 2018-02-14 VITALS — BP 119/66 | HR 75 | Wt 283.9 lb

## 2018-02-14 DIAGNOSIS — F332 Major depressive disorder, recurrent severe without psychotic features: Secondary | ICD-10-CM

## 2018-02-14 DIAGNOSIS — IMO0001 Reserved for inherently not codable concepts without codable children: Secondary | ICD-10-CM

## 2018-02-14 DIAGNOSIS — O0991 Supervision of high risk pregnancy, unspecified, first trimester: Secondary | ICD-10-CM

## 2018-02-14 DIAGNOSIS — F603 Borderline personality disorder: Secondary | ICD-10-CM

## 2018-02-14 DIAGNOSIS — Z794 Long term (current) use of insulin: Secondary | ICD-10-CM

## 2018-02-14 DIAGNOSIS — O0990 Supervision of high risk pregnancy, unspecified, unspecified trimester: Secondary | ICD-10-CM

## 2018-02-14 DIAGNOSIS — O24911 Unspecified diabetes mellitus in pregnancy, first trimester: Secondary | ICD-10-CM

## 2018-02-14 MED ORDER — INSULIN NPH (HUMAN) (ISOPHANE) 100 UNIT/ML ~~LOC~~ SUSP: SUBCUTANEOUS | 3 refills | Status: DC

## 2018-02-14 NOTE — Progress Notes (Signed)
Prenatal Visit Note Date: 02/14/2018 Clinic: [facility]  Subjective:  Crystal Castillo is a 27 y.o. G1P0000 at 5864w0d being seen today for ongoing prenatal care.  She is currently monitored for the following issues for this high-risk pregnancy and has Drug overdose, intentional ([name]); Major depressive disorder, recurrent severe without psychotic features ([name]); Alcohol use disorder, moderate, dependence ([name]); Opioid use disorder, moderate, dependence ([name]); Tobacco use disorder; Borderline personality disorder ([name]); Benzodiazepine withdrawal ([name]); Obesity in pregnancy; Pregnancy and insulin-dependent diabetes mellitus in first trimester [facility]([name]); and Supervision of high risk pregnancy, antepartum on their problem list.  Patient reports no complaints.   Contractions: Not present. Vag. Bleeding: None.   . Denies leaking of fluid.   The following portions of the patient's history were reviewed and updated as appropriate: allergies, current medications, past family history, past medical history, past social history, past surgical history and problem list. Problem list updated.  Objective:   Vitals:   02/14/18 0922  BP: 119/66  Pulse: 75  Weight: 283 lb 14.4 oz (128.8 kg)    Fetal Status: Fetal Heart Rate (bpm): 160         General:  Alert, oriented and cooperative. Patient is in no acute distress.  Skin: Skin is warm and dry. No rash noted.   Cardiovascular: Normal heart rate noted  Respiratory: Normal respiratory effort, no problems with respiration noted  Abdomen: Soft, gravid, appropriate for gestational age. Pain/Pressure: Present     Pelvic:  Cervical exam deferred        Extremities: Normal range of motion.  Edema: None  Mental Status: Normal mood and affect. Normal behavior. Normal judgment and thought content.   Urinalysis:      Assessment and Plan:  Pregnancy: G1P0000 at 2364w0d  1. Pregnancy and insulin-dependent diabetes mellitus in first trimester  [facility]([name]) Routine care. Am fasting in the high 90s and 2hr PPs mostly in the 120s. Will increase regimen to nph 20 with breakfast and 15 with dinner. Pt to start omnipod sometime in the next week. Pt declines metformin.  RN to set up visit with [facility]. Fetal echo at 22-26wks.   2. Supervision of high risk pregnancy, antepartum Routine care. Offer afp nv. Declines genetics. To start low dose asa. Already scheduled for anatomy on 2/17  3. Major depressive disorder, recurrent severe without psychotic features ([name]) No issues. No meds  4. Borderline personality disorder ([name]) See above.   Preterm labor symptoms and general obstetric precautions including but not limited to vaginal bleeding, contractions, leaking of fluid and fetal movement were reviewed in detail with the patient. Please refer to After Visit Summary for other counseling recommendations.  Return in about 3 weeks (around 03/07/2018) for hrob.   Felicity BingPickens, Aibhlinn Kalmar, MD  Declined genetics 18 and 13  [facility]

## 2018-02-14 NOTE — Progress Notes (Signed)
[facility]  [facility]  Left message requesting call back in regards to scheduling appointment.

## 2018-02-14 NOTE — Progress Notes (Signed)
Anatomy US scheduled for February 17th @ 0830.  Pt notified.

## 2018-02-15 ENCOUNTER — Encounter: Payer: Self-pay | Admitting: Family Medicine

## 2018-02-16 NOTE — L&D Delivery Note (Signed)
Patient: Crystal Castillo MRN: [id number]  GBS status: Negative   Patient is a 28 y.o. now G1P1001 s/p NSVD at [redacted]w[redacted]d, who was admitted for IOL for Type 2 DM. SROM 1h 45m prior to delivery with clear fluid.    Delivery Note At 5:37 AM a viable female was delivered via Vaginal, Spontaneous (Presentation: left OA.)  APGAR: 8, 9; weight pending.   Placenta status: spontaneous, intact.  Cord: 3 vessel with the following complications: none.   Anesthesia: Epidural, lidocaine  Episiotomy: None Lacerations: 2nd degree;Vaginal; 1st degree vaginal  Suture Repair: 3.0 monocryl  Est. Blood Loss (mL): 359  Head delivered left OA. No nuchal cord present. Shoulder and body delivered in usual fashion. Infant with spontaneous cry, placed on mother's abdomen, dried and bulb suctioned. Cord clamped x 2 after 1-minute delay, and cut by family member. Cord blood drawn. Placenta delivered spontaneously with gentle cord traction. Fundus firm with massage and Pitocin. Perineum inspected and found to have 1st and 2nd degree vaginal lacerations, which were repaired with 3.0 monocryl with good hemostasis achieved.'   Mom to postpartum.  Baby to Couplet care / Skin to Skin.  Patriciaann Clan 08/23/2018, 6:37 AM

## 2018-02-17 ENCOUNTER — Other Ambulatory Visit: Payer: Self-pay | Admitting: Family Medicine

## 2018-02-17 DIAGNOSIS — Z794 Long term (current) use of insulin: Principal | ICD-10-CM

## 2018-02-17 DIAGNOSIS — O24911 Unspecified diabetes mellitus in pregnancy, first trimester: Principal | ICD-10-CM

## 2018-02-17 DIAGNOSIS — IMO0001 Reserved for inherently not codable concepts without codable children: Secondary | ICD-10-CM

## 2018-02-17 MED ORDER — INSULIN LISPRO 100 UNIT/ML ~~LOC~~ SOLN: 30.0000 [IU] | Freq: Every day | SUBCUTANEOUS | 11 refills | Status: DC

## 2018-02-25 ENCOUNTER — Telehealth: Payer: Self-pay | Admitting: *Deleted

## 2018-02-25 NOTE — Telephone Encounter (Signed)
I reached out to patient in attempt to get BG data. She was started on Omnipod Dash insulin delivery device on Monday, 02/21/2018 with the following settings. She agreed to post BG on [employer] and that we would communicate by phone by Thursday, 02/24/2018. I have not heard from her and left message on her phone to call me back with BG's as well as to start recording them in [employer].   Omnipod Settings as of 02/21/2018 Basal @ 0.55 units/hour x 24 hours Bolus @ Fixed Doses of 3 units/ meal and 1-2 units/snack

## 2018-03-10 ENCOUNTER — Encounter: Payer: Medicaid Other | Attending: Obstetrics and Gynecology | Admitting: *Deleted

## 2018-03-10 ENCOUNTER — Ambulatory Visit (INDEPENDENT_AMBULATORY_CARE_PROVIDER_SITE_OTHER): Payer: BLUE CROSS/BLUE SHIELD | Admitting: Obstetrics and Gynecology

## 2018-03-10 ENCOUNTER — Ambulatory Visit: Payer: BLUE CROSS/BLUE SHIELD | Admitting: *Deleted

## 2018-03-10 VITALS — BP 110/60 | HR 79 | Wt 289.0 lb

## 2018-03-10 DIAGNOSIS — Z794 Long term (current) use of insulin: Secondary | ICD-10-CM

## 2018-03-10 DIAGNOSIS — O99212 Obesity complicating pregnancy, second trimester: Secondary | ICD-10-CM

## 2018-03-10 DIAGNOSIS — O0990 Supervision of high risk pregnancy, unspecified, unspecified trimester: Secondary | ICD-10-CM

## 2018-03-10 DIAGNOSIS — O24111 Pre-existing diabetes mellitus, type 2, in pregnancy, first trimester: Secondary | ICD-10-CM | POA: Diagnosis not present

## 2018-03-10 DIAGNOSIS — O24911 Unspecified diabetes mellitus in pregnancy, first trimester: Secondary | ICD-10-CM

## 2018-03-10 DIAGNOSIS — Z713 Dietary counseling and surveillance: Secondary | ICD-10-CM | POA: Insufficient documentation

## 2018-03-10 DIAGNOSIS — Z3A15 15 weeks gestation of pregnancy: Secondary | ICD-10-CM

## 2018-03-10 DIAGNOSIS — O24912 Unspecified diabetes mellitus in pregnancy, second trimester: Secondary | ICD-10-CM

## 2018-03-10 DIAGNOSIS — O0992 Supervision of high risk pregnancy, unspecified, second trimester: Secondary | ICD-10-CM

## 2018-03-10 DIAGNOSIS — IMO0001 Reserved for inherently not codable concepts without codable children: Secondary | ICD-10-CM

## 2018-03-10 DIAGNOSIS — O99210 Obesity complicating pregnancy, unspecified trimester: Secondary | ICD-10-CM

## 2018-03-10 LAB — POCT URINALYSIS DIP (DEVICE)
Bilirubin Urine: NEGATIVE
GLUCOSE, UA: NEGATIVE mg/dL
Hgb urine dipstick: NEGATIVE
KETONES UR: NEGATIVE mg/dL
LEUKOCYTES UA: NEGATIVE
Nitrite: NEGATIVE
Protein, ur: NEGATIVE mg/dL
Specific Gravity, Urine: 1.02 (ref 1.005–1.030)
Urobilinogen, UA: 0.2 mg/dL (ref 0.0–1.0)
pH: 7.5 (ref 5.0–8.0)

## 2018-03-10 NOTE — Progress Notes (Addendum)
Patient was seen on 03/10/2018 for type 2 Diabetes and pregnancy self-management follow up. EDD 08/29/2018. Patient states her fiancee has taken a job in [place] and she went to visit him last week. She tested less often while out of town and had less healthy food options. She plans to stay here to have her baby and decide on any move to [place] after that.   Patient was started on insulin @ NPH 10 units BID on 01/21/2019. She was offered to switch to Omnipod insulin delivery device which she accepted and it was ordered.  Omnipod insulin delivery device was started on 02/21/2018. Initial settings for Pod were: Basal Rate: 0.55 units/hour x 24 hours Meal Bolus set up as Fixed per meal of 3 units / meal and 1 unit / snack She states today that she is comfortable in using the Omnipod for insulin delivery and states she has not missed meal time insulin doses and typically is giving insulin when she snacks as well.   Patient was encouraged to use Baby Scripts for BG documentation, she has not started that yet. I was able to record BG from 1/12 to 1/23 dates from her Accu Chek Meter with following results: FBG running 90-115 with all but one result above 95 mg/dl Post meals: 349-179 with half being above 120 mg/dl  Based on elevated fasting BG's and some elevation after meals, I spoke with Dr. [place] Bing and we agreed to only increasing her Basal Rate by 10% from 0.55 to 0.60 units/ hour x 24 hours. Patient was able to make this change with my supervision.  She states she is aiming for 45 grams of carbohydrate with each meal (= 3 carb choices with each choice containing 15 grams). Her meal bolus is 3 units so we discussed that this is also called an Insulin to Carbohydrate Ratio of 1/15 grams (1/carb choice) Her comfort with carb counting is 9/10 when food label is available . I instructed her on Carb Counting by [employer] today so she can be more accurate in restaurant settings or when food labels are not  available.  We agreed that she should continue with 3 units/ meal but that she can also adjust the units by 1 unit/15 grams if she is eating more or less than 45 grams at that meal. She can also adjust the same for snacks, which she states she rarely eats. She stated she is comfortable with this plan.   Plan:   Aim for 3 Carb Choices per meal (45 grams) +/- 1 either way   Aim for 1-2 Carbs per snack  Continue reading food labels for Total Carbohydrate of foods  Consider  increasing your activity level by walking or other activity daily as tolerated  Continue checking BG before breakfast and 2 hours after first bite of breakfast, lunch and dinner as directed by MD   Bring Log Book/Sheet to every medical appointment  OR  Use Baby Scripts going forward to record BG electronically   Take medication as directed by MD  Patient instructed to monitor glucose levels: FBS: 60 - 95 mg/dl 2 hour: <150 mg/dl  Patient received the following handouts:  Advanced Carb Counting Handout  New Yellow Card with Carbohydrate Counting List  Patient will be seen for follow-up in 2 weeks and as needed.

## 2018-03-10 NOTE — Progress Notes (Signed)
Prenatal Visit Note Date: 03/10/2018 Clinic: [facility]  Subjective:  Crystal Castillo is a 28 y.o. G1P0000 at 6268w3d being seen today for ongoing prenatal care.  She is currently monitored for the following issues for this high-risk pregnancy and has Drug overdose, intentional ([name]); Major depressive disorder, recurrent severe without psychotic features ([name]); Alcohol use disorder, moderate, dependence ([name]); Opioid use disorder, moderate, dependence ([name]); Tobacco use disorder; Borderline personality disorder ([name]); Benzodiazepine withdrawal ([name]); Obesity in pregnancy; Pregnancy and insulin-dependent diabetes mellitus in first trimester [facility]([name]); and Supervision of high risk pregnancy, antepartum on their problem list.  Patient reports no complaints.   Contractions: Not present. Vag. Bleeding: None.  Movement: Absent. Denies leaking of fluid.   The following portions of the patient's history were reviewed and updated as appropriate: allergies, current medications, past family history, past medical history, past social history, past surgical history and problem list. Problem list updated.  Objective:   Vitals:   03/10/18 0926  BP: 110/60  Pulse: 79  Weight: 289 lb (131.1 kg)    Fetal Status:     Movement: Absent     General:  Alert, oriented and cooperative. Patient is in no acute distress.  Skin: Skin is warm and dry. No rash noted.   Cardiovascular: Normal heart rate noted  Respiratory: Normal respiratory effort, no problems with respiration noted  Abdomen: Soft, gravid, appropriate for gestational age. Pain/Pressure: Present     Pelvic:  Cervical exam deferred        Extremities: Normal range of motion.  Edema: None  Mental Status: Normal mood and affect. Normal behavior. Normal judgment and thought content.   Urinalysis:      Assessment and Plan:  Pregnancy: G1P0000 at 6768w3d  1. Supervision of high risk pregnancy, antepartum Routine care. Already set up for  anatomy u/s. Declines cffdna - AFP, Serum, Open Spina Bifida  2. Pregnancy and insulin-dependent diabetes mellitus in first trimester ([name]) On omnipod now with 26u total daily dose. Has been traveling so sporadic BS but slightly high AM fastings and 2h post prandials. To see DM education today and adjust accordingly. Set up for fetal echo nv if not done by mfm at anatomy u/s. Confirms low dose asa -A1c - AFP, Serum, Open Spina Bifida  3. Obesity in pregnancy stable  Preterm labor symptoms and general obstetric precautions including but not limited to vaginal bleeding, contractions, leaking of fluid and fetal movement were reviewed in detail with the patient. Please refer to After Visit Summary for other counseling recommendations.  Return in about 2 weeks (around 03/24/2018) for hrob.   [place] BingPickens, Elana Jian, MD

## 2018-03-12 LAB — AFP, SERUM, OPEN SPINA BIFIDA
AFP MoM: 1.39
AFP Value: 22.9 ng/mL
GEST. AGE ON COLLECTION DATE: 15.3 wk
MATERNAL AGE AT EDD: 26 a
OSBR Risk 1 IN: 1106
TEST RESULTS AFP: NEGATIVE
Weight: 289 [lb_av]

## 2018-03-12 LAB — HEMOGLOBIN A1C
ESTIMATED AVERAGE GLUCOSE: 126 mg/dL
Hgb A1c MFr Bld: 6 % — ABNORMAL HIGH (ref 4.8–5.6)

## 2018-03-28 ENCOUNTER — Encounter (HOSPITAL_COMMUNITY): Payer: Self-pay

## 2018-03-29 ENCOUNTER — Encounter: Payer: Medicaid Other | Attending: Obstetrics and Gynecology | Admitting: *Deleted

## 2018-03-29 ENCOUNTER — Ambulatory Visit (INDEPENDENT_AMBULATORY_CARE_PROVIDER_SITE_OTHER): Payer: Medicaid Other | Admitting: Family Medicine

## 2018-03-29 ENCOUNTER — Ambulatory Visit: Payer: Medicaid Other | Admitting: *Deleted

## 2018-03-29 ENCOUNTER — Encounter: Payer: Self-pay | Admitting: Family Medicine

## 2018-03-29 VITALS — BP 126/62 | HR 86 | Wt 288.7 lb

## 2018-03-29 DIAGNOSIS — O24111 Pre-existing diabetes mellitus, type 2, in pregnancy, first trimester: Secondary | ICD-10-CM | POA: Insufficient documentation

## 2018-03-29 DIAGNOSIS — O0990 Supervision of high risk pregnancy, unspecified, unspecified trimester: Secondary | ICD-10-CM

## 2018-03-29 DIAGNOSIS — Z713 Dietary counseling and surveillance: Secondary | ICD-10-CM | POA: Insufficient documentation

## 2018-03-29 DIAGNOSIS — R51 Headache: Secondary | ICD-10-CM

## 2018-03-29 DIAGNOSIS — O0993 Supervision of high risk pregnancy, unspecified, third trimester: Secondary | ICD-10-CM

## 2018-03-29 DIAGNOSIS — G8929 Other chronic pain: Secondary | ICD-10-CM

## 2018-03-29 DIAGNOSIS — O99212 Obesity complicating pregnancy, second trimester: Secondary | ICD-10-CM

## 2018-03-29 DIAGNOSIS — O99210 Obesity complicating pregnancy, unspecified trimester: Secondary | ICD-10-CM

## 2018-03-29 DIAGNOSIS — Z3A18 18 weeks gestation of pregnancy: Secondary | ICD-10-CM

## 2018-03-29 DIAGNOSIS — O24112 Pre-existing diabetes mellitus, type 2, in pregnancy, second trimester: Secondary | ICD-10-CM

## 2018-03-29 LAB — POCT URINALYSIS DIP (DEVICE)
BILIRUBIN URINE: NEGATIVE
GLUCOSE, UA: NEGATIVE mg/dL
Hgb urine dipstick: NEGATIVE
Nitrite: NEGATIVE
PROTEIN: 30 mg/dL — AB
SPECIFIC GRAVITY, URINE: 1.02 (ref 1.005–1.030)
Urobilinogen, UA: 0.2 mg/dL (ref 0.0–1.0)
pH: 7.5 (ref 5.0–8.0)

## 2018-03-29 MED ORDER — PRENATAL MULTIVITAMIN CH: 1.0000 | ORAL_TABLET | Freq: Every day | ORAL | 6 refills | Status: DC

## 2018-03-29 MED ORDER — PREPLUS 27-1 MG PO TABS: 1.0000 | ORAL_TABLET | Freq: Every day | ORAL | 6 refills | Status: DC

## 2018-03-29 MED ORDER — MAGNESIUM OXIDE 400 MG PO TABS: 400.0000 mg | ORAL_TABLET | Freq: Every day | ORAL | 6 refills | Status: DC

## 2018-03-29 NOTE — Progress Notes (Signed)
Patient was seen on 03/29/2018 for type 2 Diabetes and pregnancy self-management follow up. EDD 08/29/2018. Patient states her fiancee has taken a job in [place]. She states they are moving into a larger place and getting married in 2 weeks. (on the same day!)  Omnipod insulin delivery device was started on 02/21/2018. Initial settings for Pod were: Basal Rate: 0.55 units/hour x 24 hours Meal Bolus set up as Fixed per meal of 3 units / meal and 1 unit / snack She states today that she is comfortable in using the Omnipod for insulin delivery and states she has not missed meal time insulin doses and typically is giving insulin when she snacks as well.   Patient was encouraged to use Baby Scripts for BG documentation, she has used it occasionally but not consistently. Her MD visit today, she states she was encouraged to use it so more accurate insulin dose decisions can be made.  She reports the following BG results FBG running 85-105 with most above 95 mg/dl Post meals: 24-097 with one reading being 150 mg/dl  Based on elevated fasting BG's and some elevation after meals, I spoke with Dr.Nimeka Aneta Mins and we agreed to only increasing her Basal Rate by 10% from 0.60 to 0.65 units/ hour x 24 hours to help bring her FBG down to target more often. Patient was able to make this change with my supervision.  We agreed that she should use 4 units/ meal and 2 units/snack.  She stated she is comfortable with this plan.   Plan:   Aim for 3 Carb Choices per meal (45 grams) +/- 1 either way   Aim for 1-2 Carbs per snack  Continue reading food labels for Total Carbohydrate of foods  Consider  increasing your activity level by walking or other activity daily as tolerated  Continue checking BG before breakfast and 2 hours after first bite of breakfast, lunch and dinner as directed by MD   Bring Log Book/Sheet to every medical appointment  OR  Use Baby Scripts going forward to record BG electronically    Take medication as directed by MD  Patient instructed to monitor glucose levels: FBS: 60 - 95 mg/dl 2 hour: <353 mg/dl  Patient received the following handouts:  No new handouts today  Patient will be seen for follow-up in 4 weeks and as needed.

## 2018-03-29 NOTE — Progress Notes (Signed)
   PRENATAL VISIT NOTE  Subjective:  Crystal Castillo is a 28 y.o. G1P0000 at [redacted]w[redacted]d being seen today for ongoing prenatal care.  She is currently monitored for the following issues for this high-risk pregnancy and has Drug overdose, intentional ([name]); Major depressive disorder, recurrent severe without psychotic features ([name]); Alcohol use disorder, moderate, dependence ([name]); Opioid use disorder, moderate, dependence ([name]); Tobacco use disorder; Borderline personality disorder ([name]); Benzodiazepine withdrawal ([name]); Obesity in pregnancy; Pregnancy and insulin-dependent diabetes mellitus in first trimester ([name]); and Supervision of high risk pregnancy, antepartum on their problem list.  Patient reports no complaints. Reports that she's been checking blood sugars irregularly. Sometimes forgets or takes blood sugar 3 or 4 hours after food instead of 1-2 hours after. Has been making an effort to eat better.  Also has been really active with wedding planning and preparing to move. Reports that she's doing well from a mental health standpoint. Has met Jaime, but does not feel she needs counseling at this time.  Contractions: Not present. Vag. Bleeding: None.  Movement: Present. Denies leaking of fluid.   The following portions of the patient's history were reviewed and updated as appropriate: allergies, current medications, past family history, past medical history, past social history, past surgical history and problem list. Problem list updated.  Objective:   Vitals:   03/29/18 0835  BP: 126/62  Pulse: 86  Weight: 288 lb 11.2 oz (131 kg)    Fetal Status: Fetal Heart Rate (bpm): 154   Movement: Present     General:  Alert, oriented and cooperative. Patient is in no acute distress.  Skin: Skin is warm and dry. No rash noted.   Cardiovascular: Normal heart rate noted  Respiratory: Normal respiratory effort, no problems with respiration noted  Abdomen: Soft, gravid, appropriate for gestational age.   Pain/Pressure: Present     Pelvic: Cervical exam deferred        Extremities: Normal range of motion.  Edema: None  Mental Status: Normal mood and affect. Normal behavior. Normal judgment and thought content.   Assessment and Plan:  Pregnancy: G1P0000 at [redacted]w[redacted]d  1. Supervision of high risk pregnancy, antepartum - doing well - plan for follow up in 2 weeks   2. Obesity in pregnancy - pt has history of severe anorexia nervosa - discussed weight briefly, but emphasized healthy lifestyle instead of emphasis on weight  3. Pre-existing type 2 diabetes mellitus during pregnancy in second trimester - met with DM educator today - insulin adjusted; both basal and mealtime insulin increased  4. Headaches - start daily magnesium for prevention - tylenol PRN   Preterm labor symptoms and general obstetric precautions including but not limited to vaginal bleeding, contractions, leaking of fluid and fetal movement were reviewed in detail with the patient. Please refer to After Visit Summary for other counseling recommendations.  Return in about 2 weeks (around 04/12/2018) for HROB.  Future Appointments  Date Time Provider Department Center  04/04/2018  8:30 AM [facility] US 1 [facility]  04/12/2018 10:35 AM Dove, Myra C, MD [facility]    Nimeka Phillip, MD  

## 2018-04-04 ENCOUNTER — Encounter (HOSPITAL_COMMUNITY): Payer: Self-pay

## 2018-04-04 ENCOUNTER — Other Ambulatory Visit (HOSPITAL_COMMUNITY): Payer: Self-pay | Admitting: *Deleted

## 2018-04-04 ENCOUNTER — Ambulatory Visit (HOSPITAL_COMMUNITY)
Admission: RE | Admit: 2018-04-04 | Discharge: 2018-04-04 | Disposition: A | Discharge status: Home / Self Care | Payer: Medicaid Other | Source: Ambulatory Visit | Attending: Obstetrics & Gynecology | Admitting: Obstetrics & Gynecology

## 2018-04-04 DIAGNOSIS — Z363 Encounter for antenatal screening for malformations: Secondary | ICD-10-CM

## 2018-04-04 DIAGNOSIS — O99322 Drug use complicating pregnancy, second trimester: Secondary | ICD-10-CM

## 2018-04-04 DIAGNOSIS — O99212 Obesity complicating pregnancy, second trimester: Secondary | ICD-10-CM | POA: Diagnosis not present

## 2018-04-04 DIAGNOSIS — Z3A19 19 weeks gestation of pregnancy: Secondary | ICD-10-CM

## 2018-04-04 DIAGNOSIS — O24112 Pre-existing diabetes mellitus, type 2, in pregnancy, second trimester: Secondary | ICD-10-CM | POA: Diagnosis not present

## 2018-04-04 DIAGNOSIS — Z362 Encounter for other antenatal screening follow-up: Secondary | ICD-10-CM

## 2018-04-04 DIAGNOSIS — O99312 Alcohol use complicating pregnancy, second trimester: Secondary | ICD-10-CM | POA: Diagnosis not present

## 2018-04-04 DIAGNOSIS — O0990 Supervision of high risk pregnancy, unspecified, unspecified trimester: Secondary | ICD-10-CM | POA: Diagnosis present

## 2018-04-12 ENCOUNTER — Ambulatory Visit (INDEPENDENT_AMBULATORY_CARE_PROVIDER_SITE_OTHER): Payer: Medicaid Other | Admitting: Obstetrics & Gynecology

## 2018-04-12 VITALS — BP 120/77 | HR 92 | Wt 290.5 lb

## 2018-04-12 DIAGNOSIS — O99311 Alcohol use complicating pregnancy, first trimester: Secondary | ICD-10-CM

## 2018-04-12 DIAGNOSIS — O24911 Unspecified diabetes mellitus in pregnancy, first trimester: Secondary | ICD-10-CM

## 2018-04-12 DIAGNOSIS — F1120 Opioid dependence, uncomplicated: Secondary | ICD-10-CM

## 2018-04-12 DIAGNOSIS — O99321 Drug use complicating pregnancy, first trimester: Secondary | ICD-10-CM

## 2018-04-12 DIAGNOSIS — F1020 Alcohol dependence, uncomplicated: Secondary | ICD-10-CM

## 2018-04-12 DIAGNOSIS — F603 Borderline personality disorder: Secondary | ICD-10-CM

## 2018-04-12 DIAGNOSIS — O99210 Obesity complicating pregnancy, unspecified trimester: Secondary | ICD-10-CM

## 2018-04-12 DIAGNOSIS — IMO0001 Reserved for inherently not codable concepts without codable children: Secondary | ICD-10-CM

## 2018-04-12 DIAGNOSIS — O0992 Supervision of high risk pregnancy, unspecified, second trimester: Secondary | ICD-10-CM

## 2018-04-12 DIAGNOSIS — O99212 Obesity complicating pregnancy, second trimester: Secondary | ICD-10-CM

## 2018-04-12 DIAGNOSIS — Z3A20 20 weeks gestation of pregnancy: Secondary | ICD-10-CM

## 2018-04-12 DIAGNOSIS — O0990 Supervision of high risk pregnancy, unspecified, unspecified trimester: Secondary | ICD-10-CM

## 2018-04-12 DIAGNOSIS — Z794 Long term (current) use of insulin: Secondary | ICD-10-CM

## 2018-04-12 DIAGNOSIS — O24912 Unspecified diabetes mellitus in pregnancy, second trimester: Secondary | ICD-10-CM

## 2018-04-12 LAB — POCT URINALYSIS DIP (DEVICE)
BILIRUBIN URINE: NEGATIVE
Glucose, UA: NEGATIVE mg/dL
Hgb urine dipstick: NEGATIVE
Ketones, ur: NEGATIVE mg/dL
Leukocytes,Ua: NEGATIVE
Nitrite: NEGATIVE
PH: 7.5 (ref 5.0–8.0)
Protein, ur: NEGATIVE mg/dL
SPECIFIC GRAVITY, URINE: 1.02 (ref 1.005–1.030)
Urobilinogen, UA: 0.2 mg/dL (ref 0.0–1.0)

## 2018-04-12 NOTE — Progress Notes (Signed)
   PRENATAL VISIT NOTE  Subjective:  Crystal Castillo is a 28 y.o. G1P0000 at [redacted]w[redacted]d being seen today for ongoing prenatal care.  She is currently monitored for the following issues for this high-risk pregnancy and has Drug overdose, intentional ([name]); Major depressive disorder, recurrent severe without psychotic features ([name]); Alcohol use disorder, moderate, dependence ([name]); Opioid use disorder, moderate, dependence ([name]); Tobacco use disorder; Borderline personality disorder ([name]); Benzodiazepine withdrawal ([name]); Obesity in pregnancy; Pregnancy and insulin-dependent diabetes mellitus in first trimester [facility]); and Supervision of high risk pregnancy, antepartum on their problem list.  Patient reports no complaints.  Contractions: Not present. Vag. Bleeding: None.  Movement: Present. Denies leaking of fluid.   The following portions of the patient's history were reviewed and updated as appropriate: allergies, current medications, past family history, past medical history, past social history, past surgical history and problem list. Problem list updated.  Objective:   Vitals:   04/12/18 1043  BP: 120/77  Pulse: 92  Weight: 290 lb 8 oz (131.8 kg)    Fetal Status: Fetal Heart Rate (bpm): 149   Movement: Present     General:  Alert, oriented and cooperative. Patient is in no acute distress.  Skin: Skin is warm and dry. No rash noted.   Cardiovascular: Normal heart rate noted  Respiratory: Normal respiratory effort, no problems with respiration noted  Abdomen: Soft, gravid, appropriate for gestational age.  Pain/Pressure: Present     Pelvic: Cervical exam deferred        Extremities: Normal range of motion.  Edema: None  Mental Status: Normal mood and affect. Normal behavior. Normal judgment and thought content.   Assessment and Plan:  Pregnancy: G1P0000 at [redacted]w[redacted]d  1. Supervision of high risk pregnancy, antepartum   2. Obesity in pregnancy   - Urine drugs of abuse scrn w alc, routine  ([facility] [place] [facility])  5. Alcohol use disorder, moderate, dependence ([name])  - Urine drugs of abuse scrn w alc, routine ([facility] [place] [facility]) - If this UDS is negative, I will resolve those issues as she has been clean per her for 2 years. She is frustrated that these "problems" are following her on her medical record.  6. Pregnancy and insulin-dependent diabetes mellitus in first trimester ([name]) - I have increased her basal rate on her Omnipod from 0.65 to 0.70 - She is currently getting 4 units per meal, adjusting to 5 units with higher carb meals  Preterm labor symptoms and general obstetric precautions including but not limited to vaginal bleeding, contractions, leaking of fluid and fetal movement were reviewed in detail with the patient. Please refer to After Visit Summary for other counseling recommendations.  No follow-ups on file.  Future Appointments  Date Time Provider Department Center  05/02/2018 11:15 AM [facility] [place] 4 [facility]    Allie Bossier, MD

## 2018-04-13 LAB — URINE DRUGS OF ABUSE SCREEN W ALC, ROUTINE (REF LAB)
AMPHETAMINES, URINE: NEGATIVE ng/mL
BENZODIAZEPINE QUANT UR: NEGATIVE ng/mL
Barbiturate Quant, Ur: NEGATIVE ng/mL
Cannabinoid Quant, Ur: NEGATIVE ng/mL
Cocaine (Metab.): NEGATIVE ng/mL
Ethanol, Urine: NEGATIVE %
METHADONE SCREEN, URINE: NEGATIVE ng/mL
Opiate Quant, Ur: NEGATIVE ng/mL
PCP QUANT UR: NEGATIVE ng/mL
PROPOXYPHENE: NEGATIVE ng/mL

## 2018-04-26 ENCOUNTER — Ambulatory Visit (INDEPENDENT_AMBULATORY_CARE_PROVIDER_SITE_OTHER): Payer: Medicaid Other | Admitting: Obstetrics & Gynecology

## 2018-04-26 ENCOUNTER — Encounter: Payer: Medicaid Other | Attending: Obstetrics and Gynecology | Admitting: *Deleted

## 2018-04-26 ENCOUNTER — Encounter (HOSPITAL_COMMUNITY): Payer: Self-pay | Admitting: Obstetrics and Gynecology

## 2018-04-26 ENCOUNTER — Ambulatory Visit: Payer: Medicaid Other | Admitting: *Deleted

## 2018-04-26 ENCOUNTER — Telehealth: Payer: Self-pay | Admitting: Internal Medicine

## 2018-04-26 VITALS — BP 118/76 | HR 92 | Wt 292.7 lb

## 2018-04-26 DIAGNOSIS — O0992 Supervision of high risk pregnancy, unspecified, second trimester: Secondary | ICD-10-CM

## 2018-04-26 DIAGNOSIS — Z794 Long term (current) use of insulin: Secondary | ICD-10-CM

## 2018-04-26 DIAGNOSIS — O24312 Unspecified pre-existing diabetes mellitus in pregnancy, second trimester: Secondary | ICD-10-CM

## 2018-04-26 DIAGNOSIS — O24111 Pre-existing diabetes mellitus, type 2, in pregnancy, first trimester: Secondary | ICD-10-CM | POA: Diagnosis not present

## 2018-04-26 DIAGNOSIS — O24319 Unspecified pre-existing diabetes mellitus in pregnancy, unspecified trimester: Secondary | ICD-10-CM

## 2018-04-26 DIAGNOSIS — Z713 Dietary counseling and surveillance: Secondary | ICD-10-CM | POA: Insufficient documentation

## 2018-04-26 DIAGNOSIS — Z3A22 22 weeks gestation of pregnancy: Secondary | ICD-10-CM

## 2018-04-26 DIAGNOSIS — O0990 Supervision of high risk pregnancy, unspecified, unspecified trimester: Secondary | ICD-10-CM

## 2018-04-26 LAB — POCT URINALYSIS DIP (DEVICE)
Glucose, UA: NEGATIVE mg/dL
HGB URINE DIPSTICK: NEGATIVE
Ketones, ur: 40 mg/dL — AB
Leukocytes,Ua: NEGATIVE
Nitrite: NEGATIVE
Protein, ur: 30 mg/dL — AB
Specific Gravity, Urine: >=1.03 (ref 1.005–1.030)
Urobilinogen, UA: 0.2 mg/dL (ref 0.0–1.0)
pH: 6 (ref 5.0–8.0)

## 2018-04-26 NOTE — Progress Notes (Signed)
PRENATAL VISIT NOTE  Subjective:  Crystal Castillo is a 28 y.o. G1P0000 at 9114w1d being seen today for ongoing prenatal care.  She is currently monitored for the following issues for this high-risk pregnancy and has Major depressive disorder, recurrent severe without psychotic features ([name]); Tobacco use disorder; Borderline personality disorder ([name]); Maternal morbid obesity, antepartum ([name]); Pre-existing insulin treated diabetes mellitus during pregnancy [facility]([name]); and Supervision of high risk pregnancy, antepartum on their problem list.  Patient reports no complaints.  Contractions: Not present. Vag. Bleeding: None.  Movement: Present. Denies leaking of fluid.   The following portions of the patient's history were reviewed and updated as appropriate: allergies, current medications, past family history, past medical history, past social history, past surgical history and problem list.   Objective:   Vitals:   04/26/18 0922  BP: 118/76  Pulse: 92  Weight: 292 lb 11.2 oz (132.8 kg)    Fetal Status: Fetal Heart Rate (bpm): 147   Movement: Present     General:  Alert, oriented and cooperative. Patient is in no acute distress.  Skin: Skin is warm and dry. No rash noted.   Cardiovascular: Normal heart rate noted  Respiratory: Normal respiratory effort, no problems with respiration noted  Abdomen: Soft, gravid, appropriate for gestational age.  Pain/Pressure: Present     Pelvic: Cervical exam deferred        Extremities: Normal range of motion.  Edema: None  Mental Status: Normal mood and affect. Normal behavior. Normal judgment and thought content.   Imaging: Kore[place] Mfm Ob Detail +14 Wk  Result Date: 04/04/2018 ----------------------------------------------------------------------  OBSTETRICS REPORT                       (Signed Final 04/04/2018 10:37 am) ---------------------------------------------------------------------- Patient Info  ID #:       [id number]                          D.O.B.:   1991-01-02 (27 yrs)  Name[facility]       [facility]                    Visit Date: 04/04/2018 08:37 am ---------------------------------------------------------------------- Performed By  Performed By:     Hurman HornAlyssa Keatts          Ref. Address:     [address]                    [address]                                                             [address]                                                             [place], [place]  [address]  Attending:        Noralee Space MD        Location:         [facility]  Referred By:      Allie Bossier MD ---------------------------------------------------------------------- Orders   #  Description                          Code         Ordered By   1  [place] MFM OB DETAIL +14 WK              76811.01     MYRA DOVE  ----------------------------------------------------------------------   #  Order #                    Accession #                 Episode #   1  [id number]                  [id number]                  [id number]  ---------------------------------------------------------------------- Indications   Drug use complicating pregnancy, second        J62.831   trimester   Alcohol use complicating pregnancy, second     O99.312   trimester   Obesity complicating pregnancy, second         O99.212   trimester   Pre-existing diabetes, type 2, in pregnancy,   O24.112   second trimester- insulin   Encounter for antenatal screening for          Z36.3   malformations (AFP neg)   [redacted] weeks gestation of pregnancy                Z3A.19  ---------------------------------------------------------------------- Vital Signs  Weight (lb): 290                               Height:        5'7"  BMI:         45.42 ---------------------------------------------------------------------- Fetal Evaluation  Num Of Fetuses:         1  Fetal Heart Rate(bpm):  154  Cardiac Activity:       Observed  Presentation:           Cephalic  Placenta:               Anterior   P. Cord Insertion:      Visualized, central  Amniotic Fluid  AFI FV:      Within normal limits                              Largest Pocket(cm)                              5.03 ---------------------------------------------------------------------- Biometry  BPD:      44.4  mm     G. Age:  19w 3d         69  %    CI:         78.6   %    70 - 86  FL/HC:      18.9   %    16.1 - 18.3  HC:      158.4  mm     G. Age:  18w 5d         29  %    HC/AC:      1.06        1.09 - 1.39  AC:       149   mm     G. Age:  20w 1d         81  %    FL/BPD:     67.3   %  FL:       29.9  mm     G. Age:  19w 2d         51  %    FL/AC:      20.1   %    20 - 24  HUM:      27.3  mm     G. Age:  18w 5d         42  %  CER:        19  mm     G. Age:  18w 4d         35  %  NFT:       3.7  mm  LV:        5.3  mm  CM:        3.5  mm  Est. FW:     303  gm    0 lb 11 oz      55  % ---------------------------------------------------------------------- OB History  Gravidity:    1 ---------------------------------------------------------------------- Gestational Age  LMP:           19w 0d        Date:  11/22/17                 EDD:   08/29/18  U/S Today:     19w 3d                                        EDD:   08/26/18  Best:          19w 0d     Det. By:  LMP  (11/22/17)          EDD:   08/29/18 ---------------------------------------------------------------------- Anatomy  Cranium:               Appears normal         Aortic Arch:            Appears normal  Cavum:                 Appears normal         Ductal Arch:            Not well visualized  Ventricles:            Appears normal         Diaphragm:              Appears normal  Choroid Plexus:        Appears normal         Stomach:                Appears normal, left  sided  Cerebellum:            Appears normal         Abdomen:                Appears normal  Posterior Fossa:        Appears normal         Abdominal Wall:         Appears nml (cord                                                                        insert, abd wall)  Nuchal Fold:           Appears normal         Cord Vessels:           Appears normal (3                                                                        vessel cord)  Face:                  Not well visualized    Kidneys:                Appear normal  Lips:                  Not well visualized    Bladder:                Appears normal  Thoracic:              Appears normal         Spine:                  Appears normal  Heart:                 Not well visualized    Upper Extremities:      Appears normal  RVOT:                  Not well visualized    Lower Extremities:      Appears normal  LVOT:                  Not well visualized  Other:  Fetus appears to be female. Heels visualized. Technically difficult due          to maternal habitus and fetal position. ---------------------------------------------------------------------- Cervix Uterus Adnexa  Cervix  Length:           4.39  cm.  Normal appearance by transabdominal scan.  Uterus  No abnormality visualized.  Left Ovary  Not visualized.  Right Ovary  Not visualized.  Adnexa  No abnormality visualized. ---------------------------------------------------------------------- Impression  Ms. Dreibelbis, G1 P0 at 19-weeks' gestation, is here for fetal  anatomy scan. She has type 2 diabetes and takes insulin for  control. MSAFP screening showed low risk for open-neural  tube defects. Patient had opted  not to screen for fetal  aneuploidies.  Her most-recent hemoglobin A1C was 6%.  We performed a fetal anatomy scan. No markers of  aneuploidies or fetal structural defects are seen. Fetal  biometry is consistent with her previously-established dates.  Amniotic fluid is normal and good fetal activity is seen.  Patient understands the limitations of ultrasound in detecting  fetal anomalies.  I reassured the patient  that the likelihood of fetal congenital  malformations with hemoglobin A1C of 6% is not increased  (2% to 3%). I recommended fetal echocardiography by  pediatric cardiologist. ---------------------------------------------------------------------- Recommendations  -An appointment was made for her to return in 4 weeks for  completion of fetal anatomy.  -We will set up an appointment for fetal echocardiography. ----------------------------------------------------------------------                  Noralee Space, MD Electronically Signed Final Report   04/04/2018 10:37 am ----------------------------------------------------------------------   Assessment and Plan:  Pregnancy: G1P0000 at [redacted]w[redacted]d 1. Pre-existing insulin treated diabetes mellitus during pregnancy ([name]) Fasting CBGs all elevated >100, normal PP. Omnipod settings to be adjusted with the help of the Diabetic Coordinator. Will reevaluate in 2 weeks. Fetal ECHO today, repeat anatomy scan scheduled, already did eye exam. Continue ASA.  2. Supervision of high risk pregnancy, antepartum Preterm labor symptoms and general obstetric precautions including but not limited to vaginal bleeding, contractions, leaking of fluid and fetal movement were reviewed in detail with the patient. Please refer to After Visit Summary for other counseling recommendations.   Return in about 2 weeks (around 05/10/2018) for OB Visit (HOB).  Future Appointments  Date Time Provider Department Center  04/26/2018 10:15 AM Rosette Reveal WOC  05/02/2018 11:00 AM [facility]-MFC NURSE WH-MFC MFC-US  05/02/2018 11:15 AM [facility]-MFC [place] 4 WH-MFCUS MFC-US    Jaynie Collins, MD

## 2018-04-26 NOTE — Telephone Encounter (Signed)
Called the patient to schedule the appointment for an education visit. Stated she is only supposed to see the education consultant once a month and she normally tells [place] to schedule on her second appointment for the month. Informed the patient I would talk to our education consultant and if she agrees we'll schedule at that time or if not I will give her a call back.

## 2018-04-26 NOTE — Patient Instructions (Signed)
Return to office for any scheduled appointments. Call the office or go to the MAU at [facility] if:  You begin to have strong, frequent contractions  Your water breaks.  Sometimes it is a big gush of fluid, sometimes it is just a trickle that keeps getting your panties wet or running down your legs  You have vaginal bleeding.  It is normal to have a small amount of spotting if your cervix was checked.   You do not feel your baby moving like normal.  If you do not, get something to eat and drink and lay down and focus on feeling your baby move.   If your baby is still not moving like normal, you should call the office or go to MAU.  Any other obstetric concerns.   

## 2018-04-26 NOTE — Progress Notes (Signed)
Patient was seen on 04/26/2018 for type 2 Diabetes and pregnancy self-management follow up. EDD 08/29/2018. Patient states she moved into a larger place and got married as planned since our last visit. Her husband continues to work in [place], so he has gone back now.  Omnipod insulin delivery device was started on 02/21/2018. Initial settings for Pod were: Basal Rate: 0.55 units/hour x 24 hours Meal Bolus set up as Fixed per meal of 3 units / meal and 1 unit / snack She states today that she is comfortable in using the Omnipod for insulin delivery and states she has not missed meal time insulin doses and typically is giving insulin when she snacks as well.   Patient is not using Baby Scripts for BG documentation, so we discussed that she will use BG Log Sheet going forward and she acknowledges she needs to include more meal time BG data. .  She reports the following BG results FBG running above target in the low 100"s Post meals: of the 7 readings listed in past 2 weeks, 50% are above target  Based on elevated fasting BG's and some elevation after meals, I spoke with Dr. Jaynie Collins to review the need to increase her Basal Rate by 10% from 0.70 to 0.80 units/ hour x 24 hours to help bring her FBG down to target more often. Patient was able to make this change in her Omnipod with my supervision.  I discussed with her the option to use Insulin to Carb Ratio of 1 unit for each Carb Choice (15 grams). She is trying to average 3-4 Carb Choices per meal and 2 per snack but sometimes eats a little more or a little less. This way she can adjust her meal time insulin based on actual food intake. She states she is comfortable with this plan. .   Plan: continue-  Aim for 3 Carb Choices per meal (45 grams) +/- 1 either way   Aim for 1-2 Carbs per snack  Continue reading food labels for Total Carbohydrate of foods  Consider  increasing your activity level by walking or other activity daily as  tolerated  Continue checking BG before breakfast and 2 hours after first bite of breakfast, lunch and dinner as directed by MD   Bring Log Book/Sheet to every medical appointment    Take medication as directed by MD  Patient instructed to monitor glucose levels: FBS: 60 - 95 mg/dl 2 hour: <751 mg/dl  Patient received the following handouts:  No new handouts today  Patient will be seen for follow-up in 2-4 weeks and as needed.

## 2018-05-02 ENCOUNTER — Other Ambulatory Visit: Payer: Self-pay

## 2018-05-02 ENCOUNTER — Ambulatory Visit (HOSPITAL_COMMUNITY)
Admission: RE | Admit: 2018-05-02 | Discharge: 2018-05-02 | Disposition: A | Discharge status: Home / Self Care | Payer: Medicaid Other | Source: Ambulatory Visit | Attending: Obstetrics & Gynecology | Admitting: Obstetrics & Gynecology

## 2018-05-02 ENCOUNTER — Encounter (HOSPITAL_COMMUNITY): Payer: Self-pay

## 2018-05-02 ENCOUNTER — Ambulatory Visit (HOSPITAL_COMMUNITY): Payer: Medicaid Other | Admitting: *Deleted

## 2018-05-02 ENCOUNTER — Other Ambulatory Visit (HOSPITAL_COMMUNITY): Payer: Self-pay | Admitting: *Deleted

## 2018-05-02 VITALS — BP 128/70 | HR 86 | Temp 98.3°F | Wt 295.8 lb

## 2018-05-02 DIAGNOSIS — Z3A19 19 weeks gestation of pregnancy: Secondary | ICD-10-CM

## 2018-05-02 DIAGNOSIS — O99212 Obesity complicating pregnancy, second trimester: Secondary | ICD-10-CM

## 2018-05-02 DIAGNOSIS — O99312 Alcohol use complicating pregnancy, second trimester: Secondary | ICD-10-CM

## 2018-05-02 DIAGNOSIS — O09292 Supervision of pregnancy with other poor reproductive or obstetric history, second trimester: Secondary | ICD-10-CM

## 2018-05-02 DIAGNOSIS — O0990 Supervision of high risk pregnancy, unspecified, unspecified trimester: Secondary | ICD-10-CM

## 2018-05-02 DIAGNOSIS — Z362 Encounter for other antenatal screening follow-up: Secondary | ICD-10-CM | POA: Diagnosis present

## 2018-05-02 DIAGNOSIS — O24112 Pre-existing diabetes mellitus, type 2, in pregnancy, second trimester: Secondary | ICD-10-CM

## 2018-05-02 DIAGNOSIS — E119 Type 2 diabetes mellitus without complications: Secondary | ICD-10-CM

## 2018-05-10 ENCOUNTER — Encounter: Payer: Self-pay | Admitting: *Deleted

## 2018-05-10 ENCOUNTER — Telehealth: Payer: Self-pay | Admitting: Student

## 2018-05-10 DIAGNOSIS — F1910 Other psychoactive substance abuse, uncomplicated: Secondary | ICD-10-CM

## 2018-05-10 NOTE — Telephone Encounter (Signed)
Called the patient to inform of the COV19 restrictions. °

## 2018-05-11 ENCOUNTER — Ambulatory Visit (INDEPENDENT_AMBULATORY_CARE_PROVIDER_SITE_OTHER): Payer: Medicaid Other | Admitting: Obstetrics & Gynecology

## 2018-05-11 ENCOUNTER — Other Ambulatory Visit: Payer: Self-pay

## 2018-05-11 VITALS — BP 117/75 | HR 91 | Temp 97.7°F | Wt 295.0 lb

## 2018-05-11 DIAGNOSIS — O0990 Supervision of high risk pregnancy, unspecified, unspecified trimester: Secondary | ICD-10-CM

## 2018-05-11 DIAGNOSIS — Z794 Long term (current) use of insulin: Secondary | ICD-10-CM

## 2018-05-11 DIAGNOSIS — O24312 Unspecified pre-existing diabetes mellitus in pregnancy, second trimester: Secondary | ICD-10-CM

## 2018-05-11 DIAGNOSIS — Z3A24 24 weeks gestation of pregnancy: Secondary | ICD-10-CM

## 2018-05-11 DIAGNOSIS — O0992 Supervision of high risk pregnancy, unspecified, second trimester: Secondary | ICD-10-CM

## 2018-05-11 DIAGNOSIS — O24319 Unspecified pre-existing diabetes mellitus in pregnancy, unspecified trimester: Secondary | ICD-10-CM

## 2018-05-11 MED ORDER — INSULIN LISPRO 100 UNIT/ML ~~LOC~~ SOLN: 40.0000 [IU] | Freq: Every day | SUBCUTANEOUS | 11 refills | Status: DC

## 2018-05-11 NOTE — Progress Notes (Signed)
PRENATAL VISIT NOTE  Subjective:  Crystal Castillo is a 28 y.o. G1P0000 at [redacted]w[redacted]d being seen today for ongoing prenatal care.  She is currently monitored for the following issues for this high-risk pregnancy and has Major depressive disorder, recurrent severe without psychotic features ([name]); Tobacco use disorder; Borderline personality disorder ([name]); Maternal morbid obesity, antepartum ([name]); Pre-existing insulin treated diabetes mellitus during pregnancy ([name]); Supervision of high risk pregnancy, antepartum; and Drug abuse ([name]) on their problem list.  Patient reports no complaints.  Contractions: Not present. Vag. Bleeding: None.  Movement: Present. Denies leaking of fluid.   The following portions of the patient's history were reviewed and updated as appropriate: allergies, current medications, past family history, past medical history, past social history, past surgical history and problem list.   Objective:   Vitals:   05/11/18 1431  BP: 117/75  Pulse: 91  Temp: 97.7 F (36.5 C)  Weight: 295 lb (133.8 kg)    Fetal Status: Fetal Heart Rate (bpm): 147   Movement: Present     General:  Alert, oriented and cooperative. Patient is in no acute distress.  Skin: Skin is warm and dry. No rash noted.   Cardiovascular: Normal heart rate noted  Respiratory: Normal respiratory effort, no problems with respiration noted  Abdomen: Soft, gravid, appropriate for gestational age.  Pain/Pressure: Present     Pelvic: Cervical exam deferred        Extremities: Normal range of motion.  Edema: None  Mental Status: Normal mood and affect. Normal behavior. Normal judgment and thought content.  Imaging: [place] Mfm Ob Follow Up  Result Date: 05/03/2018 ----------------------------------------------------------------------  OBSTETRICS REPORT                        (Signed Final 05/03/2018 11:15 am) ---------------------------------------------------------------------- Patient Info  ID #:       [id number]                           D.O.B.:  [birth year] (27 yrs)  Name:       Crystal Castillo [facility]                    Visit Date: 05/02/2018 11:13 am ---------------------------------------------------------------------- Performed By  Performed By:     Hurman Horn,         Ref. Address:      [address]                                                              [place], [place]  [address]  Attending:        Lin Landsman      [facility]                                        [facility]  Referred By:      Allie Bossier MD ---------------------------------------------------------------------- Orders   #  Description                          Code         Ordered By   1  [place] MFM OB FOLLOW UP                  60454.09     RAVI [facility]  ----------------------------------------------------------------------   #  Order #                    Accession #                 Episode #   1  [id number]                  [id number]                  [id number]  ---------------------------------------------------------------------- Indications   [redacted] weeks gestation of pregnancy                Z3A.23   Poor obstetrical history (hx drug abuse -      O09.299   clean x2 years)   Alcohol use complicating pregnancy, second     O99.312   trimester   Obesity complicating pregnancy, second         O99.212   trimester   Pre-existing diabetes, type 2, in pregnancy,   O24.112   second trimester- insulin   Encounter for antenatal screening for          Z36.3   malformations (AFP neg)   [redacted] weeks gestation of pregnancy                Z3A.19  ---------------------------------------------------------------------- Vital Signs  Weight (lb): 292                               Height:        5'7"  BMI:         45.73 ----------------------------------------------------------------------  Fetal Evaluation  Num Of Fetuses:          1  Fetal Heart Rate(bpm):   154  Cardiac Activity:        Observed  Presentation:            Cephalic  Placenta:                Anterior  P. Cord Insertion:       Visualized  Amniotic Fluid  AFI FV:      Within normal limits                              Largest Pocket(cm)  6.25 ---------------------------------------------------------------------- Biometry  BPD:      56.6  mm     G. Age:  23w 2d         57  %    CI:        74.65   %    70 - 86                                                          FL/HC:       20.3  %    19.2 - 20.8  HC:      207.9  mm     G. Age:  22w 6d         31  %    HC/AC:       1.09       1.05 - 1.21  AC:      190.9  mm     G. Age:  23w 6d         68  %    FL/BPD:      74.6  %    71 - 87  FL:       42.2  mm     G. Age:  23w 5d         64  %    FL/AC:       22.1  %    20 - 24  LV:        5.5  mm  Est. FW:     618   gm     1 lb 6 oz     64  % ---------------------------------------------------------------------- OB History  Gravidity:    1 ---------------------------------------------------------------------- Gestational Age  LMP:           23w 0d        Date:  11/22/17                 EDD:   08/29/18  U/S Today:     23w 3d                                        EDD:   08/26/18  Best:          23w 0d     Det. By:  LMP  (11/22/17)          EDD:   08/29/18 ---------------------------------------------------------------------- Anatomy  Cranium:               Appears normal         Aortic Arch:            Appears normal  Cavum:                 Appears normal         Ductal Arch:            Appears normal  Ventricles:            Appears normal         Diaphragm:              Appears normal  Choroid Plexus:        Previously seen  Stomach:                Appears normal, left                                                                        sided  Cerebellum:            Previously seen        Abdomen:                 Appears normal  Posterior Fossa:       Previously seen        Abdominal Wall:         Previously seen  Nuchal Fold:           Previously seen        Cord Vessels:           Previously seen  Face:                  Appears normal         Kidneys:                Appear normal                         (orbits and profile)  Lips:                  Appears normal         Bladder:                Appears normal  Thoracic:              Appears normal         Spine:                  Previously seen  Heart:                 Appears normal         Upper Extremities:      Previously seen                         (4CH, axis, and                         situs)  RVOT:                  Appears normal         Lower Extremities:      Previously seen  LVOT:                  Appears normal  Other:  Fetus appears to be female. Heels previously visualized. Technically          difficult due to maternal habitus and fetal position. ---------------------------------------------------------------------- Impression  Anatomy complete.  Normal fetal echo with Dr. Mayer Camel on 04/26/2018  Type 2 DM ---------------------------------------------------------------------- Recommendations  Follow up growth in 4 weeks for growth exam due to Type 2  DM. ----------------------------------------------------------------------               Lin Landsman, MD Electronically Signed Final Report  05/03/2018 11:15 am ----------------------------------------------------------------------   Assessment and Plan:  Pregnancy: G1P0000 at [redacted]w[redacted]d 1. Pre-existing insulin treated diabetes mellitus during pregnancy [facility]) Doing well.  Patient informed to keep log on Babyscripts to help with remote monitoring. CBGs in paper reviewed, in range since being on Omnipod.  Will continue virtual visits with Diabetic Coordinator. Continue serial growth scans. BP cuff to be mailed to the patient. - insulin lispro (HUMALOG) 100 UNIT/ML injection; Inject 0.4 mLs (40 Units total)  into the skin daily. Via pump and to cover meals/snacks as directed  Dispense: 10 mL; Refill: 11  2. Supervision of high risk pregnancy, antepartum Preterm labor symptoms and general obstetric precautions including but not limited to vaginal bleeding, contractions, leaking of fluid and fetal movement were reviewed in detail with the patient. Please refer to After Visit Summary for other counseling recommendations.   Return in about 2 weeks (around 05/25/2018) for Phone OB Visit [facility]) and Virtual visit with Meriam Sprague also in 2 weeks. In 4 weeks: HOB visit, labs..  Future Appointments  Date Time Provider Department Center  05/26/2018  2:00 PM [facility] WOC-[facility] WOC  05/26/2018  3:35 PM Willodean Rosenthal, MD WOC-WOCA WOC  05/30/2018  2:30 PM WH-MFC NURSE WH-MFC MFC-US  05/30/2018  2:30 PM WH-MFC [place] 1 WH-MFCUS MFC-US    Jaynie Collins, MD

## 2018-05-11 NOTE — Patient Instructions (Signed)
Return to office for any scheduled appointments. Call the office or go to the MAU at [facility] if:  You begin to have strong, frequent contractions  Your water breaks.  Sometimes it is a big gush of fluid, sometimes it is just a trickle that keeps getting your panties wet or running down your legs  You have vaginal bleeding.  It is normal to have a small amount of spotting if your cervix was checked.   You do not feel your baby moving like normal.  If you do not, get something to eat and drink and lay down and focus on feeling your baby move.   If your baby is still not moving like normal, you should call the office or go to MAU.  Any other obstetric concerns.   

## 2018-05-23 ENCOUNTER — Telehealth: Payer: Self-pay

## 2018-05-23 NOTE — Telephone Encounter (Signed)
Called pt to advise not showing that a U/A was done on 05/11/18. Pt stated left in room at her last visit.Advised Udip was not done. Pt states is feeling fine anyway, also advised someone will call her about next virtual appointment.Pt verbalized understanding.

## 2018-05-23 NOTE — Telephone Encounter (Signed)
Pt called 05/20/2018 on Nurse Line asking for results for her U/A.And when will her canceled appointments be rescheduled.Requested call back at [phone].

## 2018-05-26 ENCOUNTER — Other Ambulatory Visit: Payer: Self-pay

## 2018-05-26 ENCOUNTER — Encounter: Payer: Self-pay | Admitting: Obstetrics & Gynecology

## 2018-05-30 ENCOUNTER — Ambulatory Visit (HOSPITAL_COMMUNITY): Payer: Self-pay

## 2018-05-30 ENCOUNTER — Ambulatory Visit (HOSPITAL_COMMUNITY): Payer: Medicaid Other

## 2018-05-30 ENCOUNTER — Encounter: Payer: Medicaid Other | Admitting: Family Medicine

## 2018-05-31 ENCOUNTER — Encounter: Payer: Self-pay | Admitting: General Practice

## 2018-05-31 NOTE — Progress Notes (Unsigned)
Patient triggered in babyscripts for elevated blood sugars. Per chart review patient has video visit appt tomorrow 4/15 with Venia Carbon. Will route to [employer].

## 2018-06-01 ENCOUNTER — Ambulatory Visit (INDEPENDENT_AMBULATORY_CARE_PROVIDER_SITE_OTHER): Payer: Medicaid Other | Admitting: Obstetrics and Gynecology

## 2018-06-01 ENCOUNTER — Other Ambulatory Visit: Payer: Self-pay

## 2018-06-01 ENCOUNTER — Encounter: Payer: Medicaid Other | Admitting: Family Medicine

## 2018-06-01 DIAGNOSIS — O0992 Supervision of high risk pregnancy, unspecified, second trimester: Secondary | ICD-10-CM

## 2018-06-01 DIAGNOSIS — O0990 Supervision of high risk pregnancy, unspecified, unspecified trimester: Secondary | ICD-10-CM

## 2018-06-01 DIAGNOSIS — Z3A27 27 weeks gestation of pregnancy: Secondary | ICD-10-CM

## 2018-06-01 NOTE — Progress Notes (Signed)
TELEHEALTH VIRTUAL OBSTETRICS VISIT ENCOUNTER NOTE  I connected with Crystal Castillo on 06/01/18 at  9:55 AM EDT by WebX at home and verified that I am speaking with the correct person using two identifiers.   I discussed the limitations, risks, security and privacy concerns of performing an evaluation and management service by telephone and the availability of in person appointments. I also discussed with the patient that there may be a patient responsible charge related to this service. The patient expressed understanding and agreed to proceed.  Subjective:  Crystal Castillo is a 28 y.o. G1P0000 at 6071w2d being followed for ongoing prenatal care.  She is currently monitored for the following issues for this high-risk pregnancy and has Major depressive disorder, recurrent severe without psychotic features ([name]); Tobacco use disorder; Borderline personality disorder ([name]); Maternal morbid obesity, antepartum ([name]); Pre-existing insulin treated diabetes mellitus during pregnancy ([name]); Supervision of high risk pregnancy, antepartum; and Drug abuse ([name]) on their problem list.  Patient reports no complaints. Reports fetal movement. Denies any contractions, bleeding or leaking of fluid.   The following portions of the patient's history were reviewed and updated as appropriate: allergies, current medications, past family history, past medical history, past social history, past surgical history and problem list.   Objective:   General:  Alert, oriented and cooperative.   Mental Status: Normal mood and affect perceived. Normal judgment and thought content.  Rest of physical exam deferred due to type of encounter  Assessment and Plan:  Pregnancy: G1P0000 at 7571w2d  1. Supervision of high risk pregnancy, antepartum  Fasting BS today 105; she has an omnipod and has been dosing 1 unit for every 10 carbs in Addition to her Humolog. Her Humolog is set at 0.8 every hour.           MFM Antenatal guidelines  Pregestational  DM 24/28/32/36 32-34  39 weeks   -US was canceled on Monday due to power outage. She is rescheduled for an US for growth on May 5 -Delivery at 39 weeks is recommended and she is aware of this.   - Patient to see Bev this week for new settings. Patient to increase Omnipod to 0.9 Units from 12A-8A and to increase to 1 unit per 8 carbs eaten. Discussed with Dr. Shawnie PonsPratt.  - In office visit on May 5th for 28 week labs.    There are no diagnoses linked to this encounter. Preterm labor symptoms and general obstetric precautions including but not limited to vaginal bleeding, contractions, leaking of fluid and fetal movement were reviewed in detail with the patient.  I discussed the assessment and treatment plan with the patient. The patient was provided an opportunity to ask questions and all were answered. The patient agreed with the plan and demonstrated an understanding of the instructions. The patient was advised to call back or seek an in-person office evaluation/[facility] for any urgent or concerning symptoms. Please refer to After Visit Summary for other counseling recommendations.     I provided 30 minutes of non-face-to-face time during this encounter.  Return for Virtual visit within 1 week with Bev, schedule her in office visit on May 5 for 28 week labs. .  Future Appointments  Date Time Provider Department Center  06/09/2018  [facility]  06/21/2018  9:30 AM WOC-WOCA LAB WOC-WOCA WOC  06/21/2018  1:30 PM WH-MFC NURSE WH-MFC MFC-US  06/21/2018  1:30 PM WH-MFC US 5 WH-MFCUS MFC-US  Venia Carbon, NP Center for [employer], [facility]

## 2018-06-01 NOTE — Progress Notes (Signed)
I connected with  Crystal Castillo on 06/01/18 at  9:55 AM EDT by telephone and verified that I am speaking with the correct person using two identifiers.   I discussed the limitations, risks, security and privacy concerns of performing an evaluation and management service by telephone and the availability of in person appointments. I also discussed with the patient that there may be a patient responsible charge related to this service. The patient expressed understanding and agreed to proceed.  Janene Madeira Shoshannah Faubert, CMA 06/01/2018  9:50 AM

## 2018-06-06 ENCOUNTER — Encounter: Payer: Self-pay | Admitting: General Practice

## 2018-06-06 NOTE — Progress Notes (Unsigned)
Received an alert from Babyscripts patient alerted due to elevated blood pressure, 108/90 with repeat 121/87. Will route to [employer], as she saw the patient last.

## 2018-06-09 ENCOUNTER — Other Ambulatory Visit: Payer: Self-pay

## 2018-06-09 ENCOUNTER — Encounter: Payer: Medicaid Other | Attending: Obstetrics and Gynecology | Admitting: *Deleted

## 2018-06-09 ENCOUNTER — Ambulatory Visit: Payer: Medicaid Other | Admitting: *Deleted

## 2018-06-09 DIAGNOSIS — Z713 Dietary counseling and surveillance: Secondary | ICD-10-CM | POA: Insufficient documentation

## 2018-06-09 DIAGNOSIS — O24111 Pre-existing diabetes mellitus, type 2, in pregnancy, first trimester: Secondary | ICD-10-CM | POA: Diagnosis not present

## 2018-06-09 NOTE — Progress Notes (Signed)
06/09/2018  I connected with patient on 06/09/2018 at  2:05  EDT by telephone for 30 minutes at home and verified that I am speaking with the correct person using two identifiers.  Patient was called today for type 2 Diabetes and pregnancy self-management follow up. EDD 08/29/2018. She states she has been feeling pretty well and verifies that her last provider recommended the following changes for her Omnipod:  Increase Basal from 0.7 to 0.8 u/hr from 12 MN to 8 AM and  Increase Bolus from 1 unit/ 15 grams to 1 unit/8 grams for her meals.   She did not change the Basal rate yet because she didn't know how to program the 8 hour increment. She started increasing her bolus per above about a week ago.   Patient now using Baby Scripts more regularly now, but I was unable to paste Baby Scripts Report at this time.  FBG improving and within normal limits the last 3 days.  Post meal numbers improving with 11 out of 20 within normal target ranges  Based on elevated fasting BG's and some elevation after meals, I agree with the increase in Basal rate but would suggest it for 24 hours and compromise on the Carb Ratio as follows:  Increase Basal rate from 0.8 to 0.9 u/hr x 24 hours New insulin to carb ratio decrease from 1/8 grams to 1/10 grams due to increase in basal rate throughout the whole day. Patient stated she made the Basal Rate Change and expressed understanding of Carb Ratio for meals going forward.   She checked her total daily doses of insulin over the past week and states she is averaging 50 units per day = 1500 units per month (2 vials). She should be receiving 2 vials a month already, so no plan to change Rx at this time.   Plan: continue-  Aim for 3 Carb Choices per meal (45 grams) +/- 1 either way   Aim for 1-2 Carbs per snack  Continue reading food labels for Total Carbohydrate of foods  Consider  increasing your activity level by walking or other activity daily as  tolerated  Continue checking BG before breakfast and 2 hours after first bite of breakfast, lunch and dinner as directed by MD   Bring Log Book/Sheet to every medical appointment    Take medication as directed by MD  Patient instructed to monitor glucose levels: FBS: 60 - 95 mg/dl 2 hour: <034 mg/dl  Patient received the following handouts:  No new handouts today  Patient will be seen for telephone follow-up in 2-4 weeks and as needed.

## 2018-06-16 ENCOUNTER — Inpatient Hospital Stay (HOSPITAL_COMMUNITY)
Admission: AD | Admit: 2018-06-16 | Discharge: 2018-06-16 | Disposition: A | Discharge status: Home / Self Care | Payer: Medicaid Other | Attending: Obstetrics & Gynecology | Admitting: Obstetrics & Gynecology

## 2018-06-16 ENCOUNTER — Encounter (HOSPITAL_COMMUNITY): Payer: Self-pay

## 2018-06-16 ENCOUNTER — Other Ambulatory Visit: Payer: Self-pay

## 2018-06-16 DIAGNOSIS — O368130 Decreased fetal movements, third trimester, not applicable or unspecified: Secondary | ICD-10-CM | POA: Insufficient documentation

## 2018-06-16 DIAGNOSIS — Z3689 Encounter for other specified antenatal screening: Secondary | ICD-10-CM | POA: Insufficient documentation

## 2018-06-16 DIAGNOSIS — O99213 Obesity complicating pregnancy, third trimester: Secondary | ICD-10-CM | POA: Diagnosis not present

## 2018-06-16 DIAGNOSIS — Z833 Family history of diabetes mellitus: Secondary | ICD-10-CM | POA: Insufficient documentation

## 2018-06-16 DIAGNOSIS — Z794 Long term (current) use of insulin: Secondary | ICD-10-CM | POA: Diagnosis not present

## 2018-06-16 DIAGNOSIS — O24313 Unspecified pre-existing diabetes mellitus in pregnancy, third trimester: Secondary | ICD-10-CM | POA: Diagnosis not present

## 2018-06-16 DIAGNOSIS — Z882 Allergy status to sulfonamides status: Secondary | ICD-10-CM | POA: Diagnosis not present

## 2018-06-16 DIAGNOSIS — E119 Type 2 diabetes mellitus without complications: Secondary | ICD-10-CM | POA: Diagnosis not present

## 2018-06-16 DIAGNOSIS — Z3A29 29 weeks gestation of pregnancy: Secondary | ICD-10-CM | POA: Diagnosis not present

## 2018-06-16 DIAGNOSIS — Z87891 Personal history of nicotine dependence: Secondary | ICD-10-CM | POA: Diagnosis not present

## 2018-06-16 DIAGNOSIS — O24113 Pre-existing diabetes mellitus, type 2, in pregnancy, third trimester: Secondary | ICD-10-CM | POA: Insufficient documentation

## 2018-06-16 DIAGNOSIS — Z888 Allergy status to other drugs, medicaments and biological substances status: Secondary | ICD-10-CM | POA: Diagnosis not present

## 2018-06-16 DIAGNOSIS — O99210 Obesity complicating pregnancy, unspecified trimester: Secondary | ICD-10-CM

## 2018-06-16 DIAGNOSIS — O24319 Unspecified pre-existing diabetes mellitus in pregnancy, unspecified trimester: Secondary | ICD-10-CM

## 2018-06-16 NOTE — MAU Note (Signed)
Pt reports baby movements have not felt baby move and much for the past few days. Pt is Diabetic sugars have been good according to patient. Still movements have not been good.

## 2018-06-16 NOTE — Discharge Instructions (Signed)

## 2018-06-16 NOTE — MAU Provider Note (Signed)
History     CSN: [id number]  Arrival date and time: 06/16/18 [id number]   First Provider Initiated Contact with Patient 06/16/18 1923      Chief Complaint  Patient presents with  . Decreased Fetal Movement   Crystal Castillo is a 28 y.o. G1P0000 at 8971w3d who presents today with decreased fetal movement. She states that yesterday and today she was not feeling the baby move as much. She reports that she is feeling normal fetal movement since arrival. She is diabetic and on insulin. She states that she has had decreased appetite as well. She states that her blood sugar has been within range for the last few days. They were high, and her insulin was adjusted. She denies any VB or LOF. She denies any contractions. She has had some tightening, but it goes away with rest and hydration.    OB History    Gravida  1   Para  0   Term  0   Preterm  0   AB  0   Living        SAB  0   TAB  0   Ectopic  0   Multiple      Live Births              Past Medical History:  Diagnosis Date  . Anorexia nervosa with bulimia    just finished treatment in August 2012 for this  . Anxiety   . Bartholin's cyst   . Depression   . Diabetes mellitus    not currently treated for this. Pt states she was told she had DM, but lost @  150 lbs in 9 months.  . Diabetes mellitus without complication ([name])   . Obesity   . PTSD (post-traumatic stress disorder)     Past Surgical History:  Procedure Laterality Date  . TONSILLECTOMY    . WISDOM TOOTH EXTRACTION      Family History  Problem Relation Age of Onset  . Alcohol abuse Father   . Hypertension Father   . COPD Father   . Diabetes Mother   . Asthma Mother     Social History   Tobacco Use  . Smoking status: Former Smoker    Packs/day: 0.50    Types: Cigarettes  . Smokeless tobacco: Never Used  . Tobacco comment: rarely smokes  Substance Use Topics  . Alcohol use: Not Currently    Comment: occasional drinker - 2 or 3 times a week   . Drug use: No    Allergies:  Allergies  Allergen Reactions  . Quetiapine Itching  . Sulfa Antibiotics Nausea Only  . Sulfa Antibiotics Nausea Only    Medications Prior to Admission  Medication Sig Dispense Refill Last Dose  . ACCU-CHEK FASTCLIX LANCETS MISC 1 Device by Percutaneous route 4 (four) times daily. 100 each 12 06/16/2018 at Unknown time  . aspirin EC 81 MG tablet Take 1 tablet (81 mg total) by mouth daily. 90 tablet 6 06/16/2018 at Unknown time  . calcium carbonate (TUMS - DOSED IN MG ELEMENTAL CALCIUM) 500 MG chewable tablet Chew 1 tablet by mouth 4 (four) times daily as needed for indigestion or heartburn.   Past Week at Unknown time  . folic acid (FOLVITE) 400 MCG tablet Take 400 mcg by mouth daily.   06/16/2018 at Unknown time  . glucose blood test strip Check blood glucose four times per day 100 each 12 06/16/2018 at Unknown time  . Insulin Disposable Pump (OMNIPOD  DASH 5 PACK) MISC 1 Units by Does not apply route every 3 (three) days. 10 each 11 06/16/2018 at Unknown time  . insulin lispro (HUMALOG) 100 UNIT/ML injection Inject 0.4 mLs (40 Units total) into the skin daily. Via pump and to cover meals/snacks as directed 10 mL 11 06/16/2018 at Unknown time  . Prenatal Vit-Fe Fumarate-FA (PREPLUS) 27-1 MG TABS Take 1 tablet by mouth daily. 30 tablet 6 06/16/2018 at Unknown time  . insulin NPH Human (HUMULIN N,NOVOLIN N) 100 UNIT/ML injection Inject 20 units of NPH at breakfast and 15 units at bedtime (Patient not taking: Reported on 03/10/2018) 10 mL 3 Not Taking  . Insulin Syringes, Disposable, U-100 0.5 ML MISC Use syringes BID (Patient not taking: Reported on 03/10/2018) 100 each 3 Not Taking  . magnesium oxide (MAG-OX) 400 MG tablet Take 1 tablet (400 mg total) by mouth daily. (Patient not taking: Reported on 04/04/2018) 30 tablet 6 Not Taking    Review of Systems  Constitutional: Negative for chills and fever.  Gastrointestinal: Negative for nausea and vomiting.   Genitourinary: Negative for dysuria, pelvic pain, vaginal bleeding and vaginal discharge.   Physical Exam   Blood pressure 126/69, pulse 81, temperature 98.2 F (36.8 C), resp. rate 12, height 5\' 7"  (1.702 m), weight 134.3 kg, last menstrual period 11/22/2017, SpO2 96 %.  Physical Exam  Nursing note and vitals reviewed. Constitutional: She is oriented to person, place, and time. She appears well-developed and well-nourished. No distress.  HENT:  Head: Normocephalic.  Cardiovascular: Normal rate.  Respiratory: Effort normal.  Musculoskeletal: Normal range of motion.  Neurological: She is alert and oriented to person, place, and time.  Psychiatric: She has a normal mood and affect.   NST:  Baseline: 140 Variability: moderate Accels: 10x10 Decels: none Toco: none  Appropriate for GA  MAU Course  Procedures  MDM Patient reassured with NST.  Assessment and Plan   1. NST (non-stress test) reactive   2. Maternal morbid obesity, antepartum ([name])   3. Pre-existing insulin treated diabetes mellitus during pregnancy ([name])   4. Decreased fetal movements in third trimester, single or unspecified fetus   5. [redacted] weeks gestation of pregnancy    DC home 3rd Trimester precautions  PTL precautions  Fetal kick counts RX: none  Return to MAU as needed FU with OB as planned  Follow-up Information    [facility] for [facility] Follow up.   Specialty:  Obstetrics and Gynecology Contact information: [address] [place] [address], [address] [id number] mc [place] [address] [phone]

## 2018-06-20 ENCOUNTER — Telehealth: Payer: Self-pay | Admitting: *Deleted

## 2018-06-20 NOTE — Telephone Encounter (Deleted)
  The note originally documented on this encounter has been moved the the encounter in which it belongs.  

## 2018-06-20 NOTE — Telephone Encounter (Deleted)
  The note that was originally documented on this patient has been moved to the patient to which it belongs. 

## 2018-06-21 ENCOUNTER — Ambulatory Visit (HOSPITAL_COMMUNITY)
Admission: RE | Admit: 2018-06-21 | Discharge: 2018-06-21 | Disposition: A | Discharge status: Home / Self Care | Payer: Medicaid Other | Source: Ambulatory Visit | Attending: Maternal & Fetal Medicine | Admitting: Maternal & Fetal Medicine

## 2018-06-21 ENCOUNTER — Encounter (HOSPITAL_COMMUNITY): Payer: Self-pay

## 2018-06-21 ENCOUNTER — Telehealth: Payer: Self-pay | Admitting: *Deleted

## 2018-06-21 ENCOUNTER — Other Ambulatory Visit: Payer: Self-pay

## 2018-06-21 ENCOUNTER — Ambulatory Visit (HOSPITAL_COMMUNITY): Payer: Medicaid Other | Admitting: *Deleted

## 2018-06-21 VITALS — BP 120/68 | HR 86 | Temp 98.2°F

## 2018-06-21 DIAGNOSIS — O24119 Pre-existing diabetes mellitus, type 2, in pregnancy, unspecified trimester: Secondary | ICD-10-CM

## 2018-06-21 DIAGNOSIS — O09293 Supervision of pregnancy with other poor reproductive or obstetric history, third trimester: Secondary | ICD-10-CM

## 2018-06-21 DIAGNOSIS — O24113 Pre-existing diabetes mellitus, type 2, in pregnancy, third trimester: Secondary | ICD-10-CM

## 2018-06-21 DIAGNOSIS — O99213 Obesity complicating pregnancy, third trimester: Secondary | ICD-10-CM

## 2018-06-21 DIAGNOSIS — Z794 Long term (current) use of insulin: Principal | ICD-10-CM

## 2018-06-21 DIAGNOSIS — E119 Type 2 diabetes mellitus without complications: Secondary | ICD-10-CM | POA: Insufficient documentation

## 2018-06-21 DIAGNOSIS — O99333 Smoking (tobacco) complicating pregnancy, third trimester: Secondary | ICD-10-CM

## 2018-06-21 DIAGNOSIS — Z362 Encounter for other antenatal screening follow-up: Secondary | ICD-10-CM

## 2018-06-21 DIAGNOSIS — Z3A30 30 weeks gestation of pregnancy: Secondary | ICD-10-CM

## 2018-06-21 NOTE — Telephone Encounter (Signed)
Per review of Baby Scripts today, Dr. Adrian Blackwater asked that I contact patient to increase her basal rates. I texted her (no answer on phone call) to increase from 0.9 to 1.0 u/hr.  She has adjusted her Omnipod basal rates in the past with no problems.   I asked her to confirm with me when this change was made and requested a phone call tomorrow if possible.

## 2018-06-22 ENCOUNTER — Telehealth: Payer: Self-pay | Admitting: Lactation Services

## 2018-06-22 ENCOUNTER — Other Ambulatory Visit (HOSPITAL_COMMUNITY): Payer: Self-pay | Admitting: *Deleted

## 2018-06-22 DIAGNOSIS — Z794 Long term (current) use of insulin: Principal | ICD-10-CM

## 2018-06-22 DIAGNOSIS — O24119 Pre-existing diabetes mellitus, type 2, in pregnancy, unspecified trimester: Secondary | ICD-10-CM

## 2018-06-22 NOTE — Telephone Encounter (Signed)
Chart reviewed - agree with RN documentation.   

## 2018-06-22 NOTE — Telephone Encounter (Signed)
Pt called and left message on nurse voice mail.   Pt reports she will run out of her Insulin for her Omnipod in about 2 days. She reports it is too early for her to get a refill on her current prescription. Pt reports her insulin needs are increasing and prescription has not been increased and the reason she is running low. Routed to Clinical Pool for follow up.

## 2018-06-23 ENCOUNTER — Telehealth: Payer: Self-pay | Admitting: *Deleted

## 2018-06-23 ENCOUNTER — Other Ambulatory Visit: Payer: Self-pay | Admitting: Obstetrics and Gynecology

## 2018-06-23 ENCOUNTER — Other Ambulatory Visit: Payer: Self-pay

## 2018-06-23 ENCOUNTER — Encounter: Payer: Self-pay | Admitting: Obstetrics and Gynecology

## 2018-06-23 DIAGNOSIS — O24319 Unspecified pre-existing diabetes mellitus in pregnancy, unspecified trimester: Secondary | ICD-10-CM

## 2018-06-23 DIAGNOSIS — Z794 Long term (current) use of insulin: Principal | ICD-10-CM

## 2018-06-23 MED ORDER — INSULIN LISPRO 100 UNIT/ML ~~LOC~~ SOLN: 40.0000 [IU] | Freq: Every day | SUBCUTANEOUS | 11 refills | Status: DC

## 2018-06-23 NOTE — Telephone Encounter (Signed)
Contacted patient to follow up from her need for more insulin from pharmacy.   I reviewed her insulin doses which total 1440 units / month so she should have received 2 vials. She states she was only given one.   Further review indicated 1 vial was ordered. Dr. Demetrius Charity. Constant increased the Rx to 2 vials and patient was notified of the correction.

## 2018-06-27 ENCOUNTER — Telehealth: Payer: Self-pay | Admitting: Obstetrics & Gynecology

## 2018-06-27 NOTE — Telephone Encounter (Signed)
Called the patient to confirm the appointment, the patient verbalized understanding.  Informed of fasting the night prior after midnight.

## 2018-06-29 ENCOUNTER — Telehealth: Payer: Self-pay | Admitting: Obstetrics & Gynecology

## 2018-06-29 ENCOUNTER — Telehealth: Payer: Self-pay | Admitting: Lactation Services

## 2018-06-29 ENCOUNTER — Other Ambulatory Visit: Payer: Medicaid Other

## 2018-06-29 ENCOUNTER — Other Ambulatory Visit: Payer: Self-pay

## 2018-06-29 ENCOUNTER — Ambulatory Visit (INDEPENDENT_AMBULATORY_CARE_PROVIDER_SITE_OTHER): Payer: Medicaid Other | Admitting: Obstetrics & Gynecology

## 2018-06-29 VITALS — BP 108/73 | HR 89 | Temp 97.9°F | Wt 294.9 lb

## 2018-06-29 DIAGNOSIS — O24313 Unspecified pre-existing diabetes mellitus in pregnancy, third trimester: Secondary | ICD-10-CM

## 2018-06-29 DIAGNOSIS — Z23 Encounter for immunization: Secondary | ICD-10-CM | POA: Diagnosis not present

## 2018-06-29 DIAGNOSIS — O0990 Supervision of high risk pregnancy, unspecified, unspecified trimester: Secondary | ICD-10-CM

## 2018-06-29 DIAGNOSIS — Z794 Long term (current) use of insulin: Secondary | ICD-10-CM

## 2018-06-29 DIAGNOSIS — O24319 Unspecified pre-existing diabetes mellitus in pregnancy, unspecified trimester: Secondary | ICD-10-CM

## 2018-06-29 DIAGNOSIS — Z3A31 31 weeks gestation of pregnancy: Secondary | ICD-10-CM

## 2018-06-29 NOTE — Addendum Note (Signed)
Addended by: Jaynie Collins A on: 06/29/2018 11:47 AM   Modules accepted: Orders

## 2018-06-29 NOTE — Patient Instructions (Signed)
Return to office for any scheduled appointments. Call the office or go to the [facility] [place] if:  You begin to have strong, frequent contractions  Your water breaks.  Sometimes it is a big gush of fluid, sometimes it is just a trickle that keeps getting your panties wet or running down your legs  You have vaginal bleeding.  It is normal to have a small amount of spotting if your cervix was checked.   You do not feel your baby moving like normal.  If you do not, get something to eat and drink and lay down and focus on feeling your baby move.   If your baby is still not moving like normal, you should call the office or go to MAU.  Any other obstetric concerns.   

## 2018-06-29 NOTE — Telephone Encounter (Signed)
Spoke with mom during her prenatal visit. Pt reports she plans to BF.   Pt has a Medela manual pump and a Spectra 2 pump for home use. We discussed proper flange fitting at mom's request. We discussed hand expression to begin about 38 weeks to have colostrum to take to hospital in the event infant has blood sugar issues post delivery.   Pt was asking about her blood sugars also, she reports they are better since increasing her insulin dosage but not always consistent. She reports she does not eat a snack before bed and often does not eat breakfast, she will sometimes have a protein shake in the morning with 5 grams of carbs. Enc pt to try to eat a healthy snack with protein before going to bed.   Will discuss pt blood glucose questions with Bev Paddock. Will send her an email to follow up with Pt at pts request.   Pt reports she has no further questions/concerns at this time. Pt to call with further questions/concerns as needed.   Pt informed of IP/OP LC services.

## 2018-06-29 NOTE — Telephone Encounter (Signed)
appt for 5-27 was scheduled based on Dr.Anyanwu check out notes

## 2018-06-29 NOTE — Progress Notes (Addendum)
   PRENATAL VISIT NOTE  Subjective:  Crystal Castillo is a 28 y.o. G1P0000 at [redacted]w[redacted]d being seen today for ongoing prenatal care.  She is currently monitored for the following issues for this high-risk pregnancy and has Major depressive disorder, recurrent severe without psychotic features ([name]); Tobacco use disorder; Borderline personality disorder ([name]); Maternal morbid obesity, antepartum ([name]); Pre-existing insulin treated diabetes mellitus during pregnancy ([name]); Supervision of high risk pregnancy, antepartum; and Drug abuse ([name]) on their problem list.  Patient reports no complaints.  Contractions: Irritability. Vag. Bleeding: None.  Movement: Present. Denies leaking of fluid.   The following portions of the patient's history were reviewed and updated as appropriate: allergies, current medications, past family history, past medical history, past social history, past surgical history and problem list.   Objective:   Vitals:   06/29/18 0844  BP: 108/73  Pulse: 89  Temp: 97.9 F (36.6 C)  Weight: 294 lb 14.4 oz (133.8 kg)    Fetal Status:     Movement: Present     General:  Alert, oriented and cooperative. Patient is in no acute distress.  Skin: Skin is warm and dry. No rash noted.   Cardiovascular: Normal heart rate noted  Respiratory: Normal respiratory effort, no problems with respiration noted  Abdomen: Soft, gravid, appropriate for gestational age.  Pain/Pressure: Present     Pelvic: Cervical exam deferred        Extremities: Normal range of motion.  Edema: None  Mental Status: Normal mood and affect. Normal behavior. Normal judgment and thought content.   Assessment and Plan:  Pregnancy: G1P0000 at [redacted]w[redacted]d 1. Pre-existing insulin treated diabetes mellitus during pregnancy ([name]) On review of Babyscripts CBGs, mostly within range. On Omnipod, will continue to work with Diabetic Educator. Will initiate antenatal testing next week. - Hemoglobin A1C - [place] MFM FETAL BPP WO NON STRESS;  Future  2. Need for Tdap vaccination - Tdap vaccine greater than or equal to 7yo IM  3. Supervision of high risk pregnancy, antepartum Preterm labor symptoms and general obstetric precautions including but not limited to vaginal bleeding, contractions, leaking of fluid and fetal movement were reviewed in detail with the patient. Please refer to After Visit Summary for other counseling recommendations.   Return in about 2 weeks (around 07/13/2018) for OFFICE [facility] Visit.  Future Appointments  Date Time Provider Department Center  06/29/2018  9:55 AM Smriti Barkow, Jethro Bastos, MD WOC-WOCA WOC  07/13/2018 12:40 PM WH-MFC NURSE WH-MFC MFC-US  07/13/2018 12:45 PM WH-MFC [place] 2 WH-MFCUS MFC-US  07/20/2018 11:00 AM WH-MFC NURSE WH-MFC MFC-US  07/20/2018 11:00 AM WH-MFC [place] 3 WH-MFCUS MFC-US  07/27/2018 11:00 AM WH-MFC NURSE WH-MFC MFC-US  07/27/2018 11:00 AM WH-MFC [place] 3 WH-MFCUS MFC-US    Jaynie Collins, MD

## 2018-06-30 LAB — CBC
Hematocrit: 39.8 % (ref 34.0–46.6)
Hemoglobin: 13.2 g/dL (ref 11.1–15.9)
MCH: 31.1 pg (ref 26.6–33.0)
MCHC: 33.2 g/dL (ref 31.5–35.7)
MCV: 94 fL (ref 79–97)
Platelets: 183 10*3/uL (ref 150–450)
RBC: 4.25 x10E6/uL (ref 3.77–5.28)
RDW: 12.6 % (ref 11.7–15.4)
WBC: 12.2 10*3/uL — ABNORMAL HIGH (ref 3.4–10.8)

## 2018-06-30 LAB — COMPREHENSIVE METABOLIC PANEL
ALT: 19 IU/L (ref 0–32)
AST: 17 IU/L (ref 0–40)
Albumin/Globulin Ratio: 1.5 (ref 1.2–2.2)
Albumin: 3.7 g/dL — ABNORMAL LOW (ref 3.9–5.0)
Alkaline Phosphatase: 74 IU/L (ref 39–117)
BUN/Creatinine Ratio: 14 (ref 9–23)
BUN: 7 mg/dL (ref 6–20)
Bilirubin Total: <0.2 mg/dL (ref 0.0–1.2)
CO2: 20 mmol/L (ref 20–29)
Calcium: 9.2 mg/dL (ref 8.7–10.2)
Chloride: 102 mmol/L (ref 96–106)
Creatinine, Ser: 0.51 mg/dL — ABNORMAL LOW (ref 0.57–1.00)
GFR calc Af Amer: 155 mL/min/{1.73_m2} (ref >59)
GFR calc non Af Amer: 134 mL/min/{1.73_m2} (ref >59)
Globulin, Total: 2.5 g/dL (ref 1.5–4.5)
Glucose: 111 mg/dL — ABNORMAL HIGH (ref 65–99)
Potassium: 4.1 mmol/L (ref 3.5–5.2)
Sodium: 137 mmol/L (ref 134–144)
Total Protein: 6.2 g/dL (ref 6.0–8.5)

## 2018-06-30 LAB — RPR: RPR Ser Ql: NONREACTIVE

## 2018-06-30 LAB — HIV ANTIBODY (ROUTINE TESTING W REFLEX): HIV Screen 4th Generation wRfx: NONREACTIVE

## 2018-07-01 NOTE — Telephone Encounter (Signed)
Per chart review saw provider 06/29/18 for ob visit.

## 2018-07-12 ENCOUNTER — Telehealth: Payer: Self-pay | Admitting: Student

## 2018-07-12 NOTE — Telephone Encounter (Signed)
Called the patient to inform of the upcoming appointment. Informed the patient of wearing a face mask and no visitors due to COVID19 restrictions. Left detailed voicemail.

## 2018-07-13 ENCOUNTER — Ambulatory Visit (INDEPENDENT_AMBULATORY_CARE_PROVIDER_SITE_OTHER): Payer: Medicaid Other | Admitting: Obstetrics & Gynecology

## 2018-07-13 ENCOUNTER — Ambulatory Visit (HOSPITAL_COMMUNITY)
Admission: RE | Admit: 2018-07-13 | Discharge: 2018-07-13 | Disposition: A | Discharge status: Home / Self Care | Payer: Medicaid Other | Source: Ambulatory Visit | Attending: Obstetrics and Gynecology | Admitting: Obstetrics and Gynecology

## 2018-07-13 ENCOUNTER — Other Ambulatory Visit: Payer: Self-pay

## 2018-07-13 ENCOUNTER — Ambulatory Visit (HOSPITAL_COMMUNITY): Payer: Medicaid Other | Admitting: *Deleted

## 2018-07-13 ENCOUNTER — Encounter (HOSPITAL_COMMUNITY): Payer: Self-pay

## 2018-07-13 VITALS — BP 102/68 | HR 81 | Temp 97.8°F | Wt 292.0 lb

## 2018-07-13 VITALS — BP 132/73 | HR 87 | Temp 97.5°F

## 2018-07-13 DIAGNOSIS — O99210 Obesity complicating pregnancy, unspecified trimester: Secondary | ICD-10-CM

## 2018-07-13 DIAGNOSIS — Z794 Long term (current) use of insulin: Secondary | ICD-10-CM

## 2018-07-13 DIAGNOSIS — O24113 Pre-existing diabetes mellitus, type 2, in pregnancy, third trimester: Secondary | ICD-10-CM | POA: Diagnosis not present

## 2018-07-13 DIAGNOSIS — O99213 Obesity complicating pregnancy, third trimester: Secondary | ICD-10-CM

## 2018-07-13 DIAGNOSIS — O09293 Supervision of pregnancy with other poor reproductive or obstetric history, third trimester: Secondary | ICD-10-CM | POA: Diagnosis not present

## 2018-07-13 DIAGNOSIS — O0990 Supervision of high risk pregnancy, unspecified, unspecified trimester: Secondary | ICD-10-CM

## 2018-07-13 DIAGNOSIS — O0993 Supervision of high risk pregnancy, unspecified, third trimester: Secondary | ICD-10-CM

## 2018-07-13 DIAGNOSIS — O99333 Smoking (tobacco) complicating pregnancy, third trimester: Secondary | ICD-10-CM | POA: Diagnosis not present

## 2018-07-13 DIAGNOSIS — O24319 Unspecified pre-existing diabetes mellitus in pregnancy, unspecified trimester: Secondary | ICD-10-CM

## 2018-07-13 DIAGNOSIS — O24119 Pre-existing diabetes mellitus, type 2, in pregnancy, unspecified trimester: Secondary | ICD-10-CM | POA: Insufficient documentation

## 2018-07-13 DIAGNOSIS — Z3A33 33 weeks gestation of pregnancy: Secondary | ICD-10-CM

## 2018-07-13 DIAGNOSIS — O24313 Unspecified pre-existing diabetes mellitus in pregnancy, third trimester: Secondary | ICD-10-CM

## 2018-07-13 NOTE — Progress Notes (Signed)
   PRENATAL VISIT NOTE  Subjective:  Crystal Castillo is a 28 y.o. G1P0000 at [redacted]w[redacted]d being seen today for ongoing prenatal care.  She is currently monitored for the following issues for this high-risk pregnancy and has Major depressive disorder, recurrent severe without psychotic features ([name]); Tobacco use disorder; Borderline personality disorder ([name]); Maternal morbid obesity, antepartum ([name]); Pre-existing insulin treated diabetes mellitus during pregnancy ([name]); Supervision of high risk pregnancy, antepartum; and Drug abuse ([name]) on their problem list.  Patient reports no complaints.  Contractions: Irritability. Vag. Bleeding: None.  Movement: Present. Denies leaking of fluid.   The following portions of the patient's history were reviewed and updated as appropriate: allergies, current medications, past family history, past medical history, past social history, past surgical history and problem list.   Objective:   Vitals:   07/13/18 1135  BP: 102/68  Pulse: 81  Temp: 97.8 F (36.6 C)  Weight: 292 lb (132.5 kg)    Fetal Status:     Movement: Present     General:  Alert, oriented and cooperative. Patient is in no acute distress.  Skin: Skin is warm and dry. No rash noted.   Cardiovascular: Normal heart rate noted  Respiratory: Normal respiratory effort, no problems with respiration noted  Abdomen: Soft, gravid, appropriate for gestational age.  Pain/Pressure: Present     Pelvic: Cervical exam deferred        Extremities: Normal range of motion.  Edema: None  Mental Status: Normal mood and affect. Normal behavior. Normal judgment and thought content.   Assessment and Plan:  Pregnancy: G1P0000 at [redacted]w[redacted]d 1. Supervision of high risk pregnancy, antepartum   2. Maternal morbid obesity, antepartum ([name])   3. Pre-existing insulin treated diabetes mellitus during pregnancy ([name]) - weekly BPP - on Omnipod - doing well, most values in range  Preterm labor symptoms and general  obstetric precautions including but not limited to vaginal bleeding, contractions, leaking of fluid and fetal movement were reviewed in detail with the patient. Please refer to After Visit Summary for other counseling recommendations.   No follow-ups on file.  Future Appointments  Date Time Provider Department Center  07/13/2018 12:40 PM WH-MFC NURSE WH-MFC MFC-US  07/13/2018 12:45 PM WH-MFC [place] 2 WH-MFCUS MFC-US  07/20/2018 11:00 AM WH-MFC NURSE WH-MFC MFC-US  07/20/2018 11:00 AM WH-MFC [place] 3 WH-MFCUS MFC-US  07/27/2018 11:00 AM WH-MFC NURSE WH-MFC MFC-US  07/27/2018 11:00 AM WH-MFC [place] 3 WH-MFCUS MFC-US    Allie Bossier, MD

## 2018-07-20 ENCOUNTER — Ambulatory Visit (HOSPITAL_COMMUNITY)
Admission: RE | Admit: 2018-07-20 | Discharge: 2018-07-20 | Disposition: A | Discharge status: Home / Self Care | Payer: Medicaid Other | Source: Ambulatory Visit | Attending: Obstetrics and Gynecology | Admitting: Obstetrics and Gynecology

## 2018-07-20 ENCOUNTER — Ambulatory Visit (HOSPITAL_COMMUNITY): Payer: Medicaid Other | Admitting: *Deleted

## 2018-07-20 ENCOUNTER — Other Ambulatory Visit: Payer: Self-pay

## 2018-07-20 ENCOUNTER — Encounter (HOSPITAL_COMMUNITY): Payer: Self-pay

## 2018-07-20 ENCOUNTER — Telehealth (INDEPENDENT_AMBULATORY_CARE_PROVIDER_SITE_OTHER): Payer: Medicaid Other | Admitting: Obstetrics and Gynecology

## 2018-07-20 DIAGNOSIS — O24119 Pre-existing diabetes mellitus, type 2, in pregnancy, unspecified trimester: Secondary | ICD-10-CM | POA: Diagnosis present

## 2018-07-20 DIAGNOSIS — O99210 Obesity complicating pregnancy, unspecified trimester: Secondary | ICD-10-CM | POA: Insufficient documentation

## 2018-07-20 DIAGNOSIS — O24113 Pre-existing diabetes mellitus, type 2, in pregnancy, third trimester: Secondary | ICD-10-CM

## 2018-07-20 DIAGNOSIS — O99213 Obesity complicating pregnancy, third trimester: Secondary | ICD-10-CM | POA: Diagnosis not present

## 2018-07-20 DIAGNOSIS — Z3A34 34 weeks gestation of pregnancy: Secondary | ICD-10-CM

## 2018-07-20 DIAGNOSIS — O09293 Supervision of pregnancy with other poor reproductive or obstetric history, third trimester: Secondary | ICD-10-CM | POA: Diagnosis not present

## 2018-07-20 DIAGNOSIS — Z794 Long term (current) use of insulin: Secondary | ICD-10-CM | POA: Insufficient documentation

## 2018-07-20 DIAGNOSIS — O24319 Unspecified pre-existing diabetes mellitus in pregnancy, unspecified trimester: Secondary | ICD-10-CM | POA: Diagnosis present

## 2018-07-20 DIAGNOSIS — Z362 Encounter for other antenatal screening follow-up: Secondary | ICD-10-CM | POA: Diagnosis not present

## 2018-07-20 DIAGNOSIS — O99333 Smoking (tobacco) complicating pregnancy, third trimester: Secondary | ICD-10-CM

## 2018-07-20 DIAGNOSIS — O0990 Supervision of high risk pregnancy, unspecified, unspecified trimester: Secondary | ICD-10-CM

## 2018-07-20 MED ORDER — INSULIN LISPRO 100 UNIT/ML ~~LOC~~ SOLN: 50.0000 [IU] | Freq: Every day | SUBCUTANEOUS | 11 refills | Status: DC

## 2018-07-20 NOTE — Progress Notes (Signed)
50 units per day     TELEHEALTH VIRTUAL OBSTETRICS VISIT ENCOUNTER NOTE  I connected with Crystal Castillo on 07/20/18 at  4:15 PM EDT by telephone at home and verified that I am speaking with the correct person using two identifiers.   I discussed the limitations, risks, security and privacy concerns of performing an evaluation and management service by telephone and the availability of in person appointments. I also discussed with the patient that there may be a patient responsible charge related to this service. The patient expressed understanding and agreed to proceed.  Subjective:  Crystal Castillo is a 28 y.o. G1P0000 at [redacted]w[redacted]d being followed for ongoing prenatal care.  She is currently monitored for the following issues for this high-risk pregnancy and has Major depressive disorder, recurrent severe without psychotic features ([name]); Tobacco use disorder; Borderline personality disorder ([name]); Maternal morbid obesity, antepartum ([name]); Pre-existing insulin treated diabetes mellitus during pregnancy ([name]); Supervision of high risk pregnancy, antepartum; and Drug abuse ([name]) on their problem list.  Patient reports no complaints. Reports fetal movement. Denies any contractions, bleeding or leaking of fluid.   The following portions of the patient's history were reviewed and updated as appropriate: allergies, current medications, past family history, past medical history, past social history, past surgical history and problem list.   Objective:  There were no vitals filed for this visit.  Babyscripts Data Reviewed: no  General:  Alert, oriented and cooperative.   Mental Status: Normal mood and affect perceived. Normal judgment and thought content.  Rest of physical exam deferred due to type of encounter  Assessment and Plan:  Pregnancy: G1P0000 at [redacted]w[redacted]d 1. Supervision of high risk pregnancy, antepartum Routine care. gbs nv  2. Pre-existing insulin treated diabetes mellitus during pregnancy  ([name]) On omnipod with approx 50u qday. Gives normal am fasting and 2h PP numbers. Normal growth and bpp today. Continue with qwk testing. Delivery @ 39wks - insulin lispro (HUMALOG) 100 UNIT/ML injection; Inject 0.5 mLs (50 Units total) into the skin daily. Via pump and to cover meals/snacks as directed  Dispense: 20 mL; Refill: 11  3. Maternal morbid obesity, antepartum ([name]) No isues  Preterm labor symptoms and general obstetric precautions including but not limited to vaginal bleeding, contractions, leaking of fluid and fetal movement were reviewed in detail with the patient.  I discussed the assessment and treatment plan with the patient. The patient was provided an opportunity to ask questions and all were answered. The patient agreed with the plan and demonstrated an understanding of the instructions. The patient was advised to call back or seek an in-person office evaluation/go to MAU at [facility] for any urgent or concerning symptoms. Please refer to After Visit Summary for other counseling recommendations.   I provided 10 minutes of non-face-to-face time during this encounter. The visit was conducted via MyChart Video-medicine  Return in about 2 weeks (around 08/03/2018) for 2wk rob in person.  Future Appointments  Date Time Provider Department Center  07/27/2018 11:00 AM WH-MFC NURSE [facility] MFC-US  07/27/2018 11:00 AM WH-MFC [place] 3 WH-MFCUS MFC-US  07/28/2018  2:15 PM Adam Phenix, MD WOC-WOCA WOC  08/03/2018 12:30 PM WH-MFC [place] 1 WH-MFCUS MFC-US  08/10/2018  2:45 PM WH-MFC [place] 5 WH-MFCUS MFC-US  08/17/2018 11:00 AM WH-MFC [place] 3 WH-MFCUS MFC-US    [place] Bing, MD Center for [employer], [facility]

## 2018-07-20 NOTE — Progress Notes (Signed)
I connected with  Emaree L Odonnel on 07/20/18 at  4:15 PM EDT by telephone and verified that I am speaking with the correct person using two identifiers.   I discussed the limitations, risks, security and privacy concerns of performing an evaluation and management service by telephone and the availability of in person appointments. I also discussed with the patient that there may be a patient responsible charge related to this service. The patient expressed understanding and agreed to proceed.  Ernestina Patches, CMA 07/20/2018  4:24 PM

## 2018-07-22 ENCOUNTER — Other Ambulatory Visit (HOSPITAL_COMMUNITY): Payer: Self-pay | Admitting: *Deleted

## 2018-07-22 DIAGNOSIS — O24119 Pre-existing diabetes mellitus, type 2, in pregnancy, unspecified trimester: Secondary | ICD-10-CM

## 2018-07-22 DIAGNOSIS — Z794 Long term (current) use of insulin: Secondary | ICD-10-CM

## 2018-07-27 ENCOUNTER — Ambulatory Visit (HOSPITAL_COMMUNITY)
Admission: RE | Admit: 2018-07-27 | Discharge: 2018-07-27 | Disposition: A | Discharge status: Home / Self Care | Payer: Medicaid Other | Source: Ambulatory Visit | Attending: Obstetrics and Gynecology | Admitting: Obstetrics and Gynecology

## 2018-07-27 ENCOUNTER — Other Ambulatory Visit: Payer: Self-pay

## 2018-07-27 ENCOUNTER — Ambulatory Visit (HOSPITAL_COMMUNITY): Payer: Medicaid Other | Admitting: *Deleted

## 2018-07-27 ENCOUNTER — Telehealth: Payer: Self-pay | Admitting: Family Medicine

## 2018-07-27 ENCOUNTER — Encounter (HOSPITAL_COMMUNITY): Payer: Self-pay

## 2018-07-27 VITALS — BP 101/56 | HR 89 | Temp 98.4°F

## 2018-07-27 DIAGNOSIS — O24119 Pre-existing diabetes mellitus, type 2, in pregnancy, unspecified trimester: Secondary | ICD-10-CM | POA: Diagnosis not present

## 2018-07-27 DIAGNOSIS — O99213 Obesity complicating pregnancy, third trimester: Secondary | ICD-10-CM

## 2018-07-27 DIAGNOSIS — O09293 Supervision of pregnancy with other poor reproductive or obstetric history, third trimester: Secondary | ICD-10-CM

## 2018-07-27 DIAGNOSIS — O24113 Pre-existing diabetes mellitus, type 2, in pregnancy, third trimester: Secondary | ICD-10-CM

## 2018-07-27 DIAGNOSIS — Z794 Long term (current) use of insulin: Secondary | ICD-10-CM

## 2018-07-27 DIAGNOSIS — O99333 Smoking (tobacco) complicating pregnancy, third trimester: Secondary | ICD-10-CM

## 2018-07-27 DIAGNOSIS — Z3A35 35 weeks gestation of pregnancy: Secondary | ICD-10-CM

## 2018-07-27 NOTE — Telephone Encounter (Signed)
Called and spoke to patient, she is aware that her appt is a mychart visit and she have the app downloaded.

## 2018-07-28 ENCOUNTER — Telehealth (INDEPENDENT_AMBULATORY_CARE_PROVIDER_SITE_OTHER): Payer: Medicaid Other | Admitting: Obstetrics & Gynecology

## 2018-07-28 ENCOUNTER — Telehealth: Payer: Self-pay | Admitting: Obstetrics & Gynecology

## 2018-07-28 VITALS — BP 108/70 | HR 81

## 2018-07-28 DIAGNOSIS — O24313 Unspecified pre-existing diabetes mellitus in pregnancy, third trimester: Secondary | ICD-10-CM

## 2018-07-28 DIAGNOSIS — Z3A35 35 weeks gestation of pregnancy: Secondary | ICD-10-CM

## 2018-07-28 DIAGNOSIS — O24319 Unspecified pre-existing diabetes mellitus in pregnancy, unspecified trimester: Secondary | ICD-10-CM

## 2018-07-28 DIAGNOSIS — O0990 Supervision of high risk pregnancy, unspecified, unspecified trimester: Secondary | ICD-10-CM

## 2018-07-28 DIAGNOSIS — Z794 Long term (current) use of insulin: Secondary | ICD-10-CM

## 2018-07-28 NOTE — Telephone Encounter (Signed)
Spoke with patient to have her scheduled for her next ob appointment (6/17 @ 2:15 office) and f/u with Bev for a virtual on 6/17 @ 4:00. Patient instructed to wear a face mask for the entire appointment and no visitors are allowed with her during the appointment. Patient was screened for covid-19 symptoms and denied having any.

## 2018-07-28 NOTE — Progress Notes (Signed)
I connected with  Crystal Castillo on 07/28/18 at  2:15 PM EDT by telephone and verified that I am speaking with the correct person using two identifiers.   I discussed the limitations, risks, security and privacy concerns of performing an evaluation and management service by telephone and the availability of in person appointments. I also discussed with the patient that there may be a patient responsible charge related to this service. The patient expressed understanding and agreed to proceed.  [place], [place] 07/28/2018  2:04 PM

## 2018-07-28 NOTE — Progress Notes (Signed)
   TELEHEALTH VIRTUAL OBSTETRICS VISIT ENCOUNTER NOTE  I connected with Crystal Castillo on 07/28/18 at  2:15 PM EDT by telephone at home and verified that I am speaking with the correct person using two identifiers.   I discussed the limitations, risks, security and privacy concerns of performing an evaluation and management service by telephone and the availability of in person appointments. I also discussed with the patient that there may be a patient responsible charge related to this service. The patient expressed understanding and agreed to proceed.  Subjective:  Crystal Castillo is a 28 y.o. G1P0000 at [redacted]w[redacted]d being followed for ongoing prenatal care.  She is currently monitored for the following issues for this high-risk pregnancy and has Major depressive disorder, recurrent severe without psychotic features ([place]); Tobacco use disorder; Borderline personality disorder ([place]); Maternal morbid obesity, antepartum ([place]); Pre-existing insulin treated diabetes mellitus during pregnancy ([place]); Supervision of high risk pregnancy, antepartum; and Drug abuse ([place]) on their problem list.  Patient reports no complaints. Reports fetal movement. Denies any contractions, bleeding or leaking of fluid.   The following portions of the patient's history were reviewed and updated as appropriate: allergies, current medications, past family history, past medical history, past social history, past surgical history and problem list.   Objective:   General:  Alert, oriented and cooperative.   Mental Status: Normal mood and affect perceived. Normal judgment and thought content.  Rest of physical exam deferred due to type of encounter  Assessment and Plan:  Pregnancy: G1P0000 at [redacted]w[redacted]d 1. Supervision of high risk pregnancy, antepartum Normal BP, growth and fetal testing  2. Pre-existing insulin treated diabetes mellitus during pregnancy ([place]) FBS and PP reported as nl, continue present insulin setting via Omnipod   Preterm labor symptoms and general obstetric precautions including but not limited to vaginal bleeding, contractions, leaking of fluid and fetal movement were reviewed in detail with the patient.  I discussed the assessment and treatment plan with the patient. The patient was provided an opportunity to ask questions and all were answered. The patient agreed with the plan and demonstrated an understanding of the instructions. The patient was advised to call back or seek an in-person office evaluation/go to MAU at [facility] for any urgent or concerning symptoms. Please refer to After Visit Summary for other counseling recommendations.   I provided 10 minutes of non-face-to-face time during this encounter.  Return in about 1 week (around 08/04/2018) for in person, same day as BPP.  Future Appointments  Date Time Provider [place]  08/03/2018 12:30 PM [place] MFC-US  08/03/2018 12:30 PM [place] 1 WH-MFCUS MFC-US  08/10/2018  2:45 PM [place] NURSE [place] MFC-US  08/10/2018  2:45 PM [place] 5 WH-MFCUS MFC-US  08/17/2018 11:00 AM [place] NURSE [place] MFC-US  08/17/2018 11:00 AM WH-MFC [place] 3 WH-MFCUS MFC-US    Emeterio Reeve, [place] for [employer], [place]

## 2018-07-28 NOTE — Patient Instructions (Signed)
Labor Induction    Labor induction is when steps are taken to cause a pregnant woman to begin the labor process. Most women go into labor on their own between 37 weeks and 42 weeks of pregnancy. When this does not happen or when there is a medical need for labor to begin, steps may be taken to induce labor. Labor induction causes a pregnant woman's uterus to contract. It also causes the cervix to soften (ripen), open (dilate), and thin out (efface). Usually, labor is not induced before 39 weeks of pregnancy unless there is a medical reason to do so. Your health care provider will determine if labor induction is needed.  Before inducing labor, your health care provider will consider a number of factors, including:  · Your medical condition and your baby's.  · How many weeks along you are in your pregnancy.  · How mature your baby's lungs are.  · The condition of your cervix.  · The position of your baby.  · The size of your birth canal.  What are some reasons for labor induction?  Labor may be induced if:  · Your health or your baby's health is at risk.  · Your pregnancy is overdue by 1 week or more.  · Your water breaks but labor does not start on its own.  · There is a low amount of amniotic fluid around your baby.  You may also choose (elect) to have labor induced at a certain time. Generally, elective labor induction is done no earlier than 39 weeks of pregnancy.  What methods are used for labor induction?  Methods used for labor induction include:  · Prostaglandin medicine. This medicine starts contractions and causes the cervix to dilate and ripen. It can be taken by mouth (orally) or by being inserted into the vagina (suppository).  · Inserting a small, thin tube (catheter) with a balloon into the vagina and then expanding the balloon with water to dilate the cervix.  · Stripping the membranes. In this method, your health care provider gently separates amniotic sac tissue from the cervix. This causes the  cervix to stretch, which in turn causes the release of a hormone called progesterone. The hormone causes the uterus to contract. This procedure is often done during an office visit, after which you will be sent home to wait for contractions to begin.  · Breaking the water. In this method, your health care provider uses a small instrument to make a small hole in the amniotic sac. This eventually causes the amniotic sac to break. Contractions should begin after a few hours.  · Medicine to trigger or strengthen contractions. This medicine is given through an IV that is inserted into a vein in your arm.  Except for membrane stripping, which can be done in a clinic, labor induction is done in the hospital so that you and your baby can be carefully monitored.  How long does it take for labor to be induced?  The length of time it takes to induce labor depends on how ready your body is for labor. Some inductions can take up to 2-3 days, while others may take less than a day. Induction may take longer if:  · You are induced early in your pregnancy.  · It is your first pregnancy.  · Your cervix is not ready.  What are some risks associated with labor induction?  Some risks associated with labor induction include:  · Changes in fetal heart rate, such as being too   high, too low, or irregular (erratic).  · Failed induction.  · Infection in the mother or the baby.  · Increased risk of having a cesarean delivery.  · Fetal death.  · Breaking off (abruption) of the placenta from the uterus (rare).  · Rupture of the uterus (very rare).  When induction is needed for medical reasons, the benefits of induction generally outweigh the risks.  What are some reasons for not inducing labor?  Labor induction should not be done if:  · Your baby does not tolerate contractions.  · You have had previous surgeries on your uterus, such as a myomectomy, removal of fibroids, or a vertical scar from a previous cesarean delivery.  · Your placenta lies  very low in your uterus and blocks the opening of the cervix (placenta previa).  · Your baby is not in a head-down position.  · The umbilical cord drops down into the birth canal in front of the baby.  · There are unusual circumstances, such as the baby being very early (premature).  · You have had more than 2 previous cesarean deliveries.  Summary  · Labor induction is when steps are taken to cause a pregnant woman to begin the labor process.  · Labor induction causes a pregnant woman's uterus to contract. It also causes the cervix to ripen, dilate, and efface.  · Labor is not induced before 39 weeks of pregnancy unless there is a medical reason to do so.  · When induction is needed for medical reasons, the benefits of induction generally outweigh the risks.  This information is not intended to replace advice given to you by your health care provider. Make sure you discuss any questions you have with your health care provider.  Document Released: 06/24/2006 Document Revised: 03/18/2016 Document Reviewed: 03/18/2016  Elsevier Interactive Patient Education © 2019 Elsevier Inc.

## 2018-08-03 ENCOUNTER — Encounter: Payer: Medicaid Other | Attending: Obstetrics and Gynecology | Admitting: *Deleted

## 2018-08-03 ENCOUNTER — Other Ambulatory Visit: Payer: Self-pay

## 2018-08-03 ENCOUNTER — Other Ambulatory Visit (HOSPITAL_COMMUNITY)
Admission: RE | Admit: 2018-08-03 | Discharge: 2018-08-03 | Disposition: A | Discharge status: Home / Self Care | Payer: Medicaid Other | Source: Ambulatory Visit | Attending: Obstetrics and Gynecology | Admitting: Obstetrics and Gynecology

## 2018-08-03 ENCOUNTER — Encounter: Payer: Self-pay | Admitting: Obstetrics and Gynecology

## 2018-08-03 ENCOUNTER — Ambulatory Visit (INDEPENDENT_AMBULATORY_CARE_PROVIDER_SITE_OTHER): Payer: Medicaid Other | Admitting: Obstetrics and Gynecology

## 2018-08-03 ENCOUNTER — Encounter (HOSPITAL_COMMUNITY): Payer: Self-pay

## 2018-08-03 ENCOUNTER — Ambulatory Visit (HOSPITAL_COMMUNITY): Payer: Medicaid Other | Admitting: *Deleted

## 2018-08-03 ENCOUNTER — Ambulatory Visit (HOSPITAL_COMMUNITY)
Admission: RE | Admit: 2018-08-03 | Discharge: 2018-08-03 | Disposition: A | Discharge status: Home / Self Care | Payer: Medicaid Other | Source: Ambulatory Visit | Attending: Obstetrics and Gynecology | Admitting: Obstetrics and Gynecology

## 2018-08-03 ENCOUNTER — Other Ambulatory Visit: Payer: Self-pay | Admitting: Obstetrics & Gynecology

## 2018-08-03 ENCOUNTER — Ambulatory Visit: Payer: Medicaid Other | Admitting: *Deleted

## 2018-08-03 VITALS — BP 119/67 | HR 84 | Temp 98.6°F

## 2018-08-03 VITALS — BP 101/64 | HR 101 | Temp 98.3°F | Wt 294.0 lb

## 2018-08-03 DIAGNOSIS — O99213 Obesity complicating pregnancy, third trimester: Secondary | ICD-10-CM | POA: Diagnosis not present

## 2018-08-03 DIAGNOSIS — O99210 Obesity complicating pregnancy, unspecified trimester: Secondary | ICD-10-CM | POA: Diagnosis present

## 2018-08-03 DIAGNOSIS — O24119 Pre-existing diabetes mellitus, type 2, in pregnancy, unspecified trimester: Secondary | ICD-10-CM

## 2018-08-03 DIAGNOSIS — O09293 Supervision of pregnancy with other poor reproductive or obstetric history, third trimester: Secondary | ICD-10-CM

## 2018-08-03 DIAGNOSIS — Z794 Long term (current) use of insulin: Secondary | ICD-10-CM

## 2018-08-03 DIAGNOSIS — O24113 Pre-existing diabetes mellitus, type 2, in pregnancy, third trimester: Secondary | ICD-10-CM

## 2018-08-03 DIAGNOSIS — Z713 Dietary counseling and surveillance: Secondary | ICD-10-CM | POA: Diagnosis not present

## 2018-08-03 DIAGNOSIS — O24319 Unspecified pre-existing diabetes mellitus in pregnancy, unspecified trimester: Secondary | ICD-10-CM | POA: Diagnosis present

## 2018-08-03 DIAGNOSIS — O99333 Smoking (tobacco) complicating pregnancy, third trimester: Secondary | ICD-10-CM

## 2018-08-03 DIAGNOSIS — Z3A36 36 weeks gestation of pregnancy: Secondary | ICD-10-CM

## 2018-08-03 DIAGNOSIS — O24111 Pre-existing diabetes mellitus, type 2, in pregnancy, first trimester: Secondary | ICD-10-CM | POA: Insufficient documentation

## 2018-08-03 DIAGNOSIS — O0990 Supervision of high risk pregnancy, unspecified, unspecified trimester: Secondary | ICD-10-CM | POA: Diagnosis present

## 2018-08-03 DIAGNOSIS — O24313 Unspecified pre-existing diabetes mellitus in pregnancy, third trimester: Secondary | ICD-10-CM

## 2018-08-03 DIAGNOSIS — O0993 Supervision of high risk pregnancy, unspecified, third trimester: Secondary | ICD-10-CM

## 2018-08-03 LAB — OB RESULTS CONSOLE GBS: GBS: NEGATIVE

## 2018-08-03 LAB — OB RESULTS CONSOLE GC/CHLAMYDIA: Gonorrhea: NEGATIVE

## 2018-08-03 NOTE — Progress Notes (Signed)
   PRENATAL VISIT NOTE  Subjective:  Crystal Castillo is a 28 y.o. G1P0000 at [redacted]w[redacted]d being seen today for ongoing prenatal care.  She is currently monitored for the following issues for this high-risk pregnancy and has Major depressive disorder, recurrent severe without psychotic features ([place]); Tobacco use disorder; Borderline personality disorder ([place]); Maternal morbid obesity, antepartum ([place]); Pre-existing insulin treated diabetes mellitus during pregnancy ([place]); Supervision of high risk pregnancy, antepartum; and Drug abuse ([place]) on their problem list.  Patient reports cramping and vaginal pressure.  Contractions: Irritability. Vag. Bleeding: None.  Movement: Present. Denies leaking of fluid.   The following portions of the patient's history were reviewed and updated as appropriate: allergies, current medications, past family history, past medical history, past social history, past surgical history and problem list.   Objective:   Vitals:   08/03/18 1457  BP: 101/64  Pulse: (!) 101  Temp: 98.3 F (36.8 C)  Weight: 294 lb (133.4 kg)   Fetal Status: Fetal Heart Rate (bpm): 154   Movement: Present     General:  Alert, oriented and cooperative. Patient is in no acute distress.  Skin: Skin is warm and dry. No rash noted.   Cardiovascular: Normal heart rate noted  Respiratory: Normal respiratory effort, no problems with respiration noted  Abdomen: Soft, gravid, appropriate for gestational age.  Pain/Pressure: Present     Pelvic: Cervical exam performed   Extremities: Normal range of motion.  Edema: None  Mental Status: Normal mood and affect. Normal behavior. Normal judgment and thought content.   Assessment and Plan:  Pregnancy: G1P0000 at [redacted]w[redacted]d  1. Supervision of high risk pregnancy, antepartum  2. Pre-existing insulin treated diabetes mellitus during pregnancy ([place]) - Has Omnipod - CBGs within range - FG: 80-90, today was 122 - PP: 80-125 - Has diabetes appt today - BPP 8/8  today - last growth wnl  3. Maternal morbid obesity, antepartum ([place])  Preterm labor symptoms and general obstetric precautions including but not limited to vaginal bleeding, contractions, leaking of fluid and fetal movement were reviewed in detail with the patient. Please refer to After Visit Summary for other counseling recommendations.   Return in about 1 week (around 08/10/2018) for OB visit (MD), virtual.  Future Appointments  Date Time Provider [place]  08/03/2018  4:00 PM [place]  08/10/2018  2:15 PM [place] N[place] MFC-US  08/10/2018  2:45 PM [place] MFC-US  08/10/2018  2:45 PM [place] 5 WH-MFCUS MFC-US  08/17/2018 10:45 AM [place] NST [place] MFC-US  08/17/2018 11:00 AM [place] NURSE [place] MFC-US  08/17/2018 11:00 AM WH-MFC [place] 3 WH-MFCUS MFC-US    Sloan Leiter, MD

## 2018-08-03 NOTE — Procedures (Signed)
Crystal Castillo Feb 06, 1991 [redacted]w[redacted]d  Fetus A Non-Stress Test Interpretation for 08/03/18  Indication: Diabetes  Fetal Heart Rate A Mode: External Baseline Rate (A): 135 bpm Variability: Moderate Accelerations: 15 x 15 Decelerations: None Multiple birth?: No  Uterine Activity Mode: Toco Contraction Frequency (min): none noted  Interpretation (Fetal Testing) Nonstress Test Interpretation: Reactive Comments: FHR tracing rev'd by Dr. Gertie Exon

## 2018-08-03 NOTE — Progress Notes (Signed)
Telephone follow up visit. Patient EDD is 10/09/2018 and she is currently at 36@2d .  She saw Dr. Vivien Rota earlier this afternoon.   Per Baby Scripts: her overall control looks really great. She states she did not feel well this AM and was in some pain, which could explain the elevation of her fasting BG this morning.   Current insulin settings: Basal Rate: 1.0 units/hour x 24 hours = 24 units/day Bolus: 1 unit for every 8-10 grams carbohydrate at each meal and snack.   No change to insulin doses today.  I spoke to her about the decrease in insulin requirements after delivery. She may not need insulin at all. I recommended if she is wearing the Omnipod, we will decrease the basal rate to 50% of the current rate (0.5 units/hour) and is she still winds up with hypoglycemia, decrease another 50% (0.25 units/hour) or as needed. She expressed understanding and agreement.   She states she would rather stay on the Omnipod and take insulin instead of taking Metformin as that medicine upsets her stomach so much.        Plan: Patient to notify me when she delivers so we can assess insulin requirements at that time.

## 2018-08-04 LAB — GC/CHLAMYDIA PROBE AMP (~~LOC~~) NOT AT ARMC
Chlamydia: NEGATIVE
Neisseria Gonorrhea: NEGATIVE

## 2018-08-07 LAB — CULTURE, BETA STREP (GROUP B ONLY): Strep Gp B Culture: NEGATIVE

## 2018-08-10 ENCOUNTER — Ambulatory Visit (HOSPITAL_COMMUNITY): Payer: Medicaid Other | Admitting: *Deleted

## 2018-08-10 ENCOUNTER — Other Ambulatory Visit (HOSPITAL_COMMUNITY): Payer: Self-pay | Admitting: Obstetrics and Gynecology

## 2018-08-10 ENCOUNTER — Other Ambulatory Visit: Payer: Self-pay

## 2018-08-10 ENCOUNTER — Encounter (HOSPITAL_COMMUNITY): Payer: Self-pay

## 2018-08-10 ENCOUNTER — Encounter: Payer: Self-pay | Admitting: Obstetrics and Gynecology

## 2018-08-10 ENCOUNTER — Ambulatory Visit (HOSPITAL_COMMUNITY)
Admission: RE | Admit: 2018-08-10 | Discharge: 2018-08-10 | Disposition: A | Discharge status: Home / Self Care | Payer: Medicaid Other | Source: Ambulatory Visit | Attending: Obstetrics and Gynecology | Admitting: Obstetrics and Gynecology

## 2018-08-10 ENCOUNTER — Telehealth (INDEPENDENT_AMBULATORY_CARE_PROVIDER_SITE_OTHER): Payer: Medicaid Other | Admitting: Obstetrics and Gynecology

## 2018-08-10 VITALS — BP 108/74 | HR 87

## 2018-08-10 DIAGNOSIS — O99210 Obesity complicating pregnancy, unspecified trimester: Secondary | ICD-10-CM | POA: Diagnosis present

## 2018-08-10 DIAGNOSIS — O99213 Obesity complicating pregnancy, third trimester: Secondary | ICD-10-CM

## 2018-08-10 DIAGNOSIS — O24119 Pre-existing diabetes mellitus, type 2, in pregnancy, unspecified trimester: Secondary | ICD-10-CM

## 2018-08-10 DIAGNOSIS — O24319 Unspecified pre-existing diabetes mellitus in pregnancy, unspecified trimester: Secondary | ICD-10-CM

## 2018-08-10 DIAGNOSIS — Z794 Long term (current) use of insulin: Secondary | ICD-10-CM | POA: Insufficient documentation

## 2018-08-10 DIAGNOSIS — O24113 Pre-existing diabetes mellitus, type 2, in pregnancy, third trimester: Secondary | ICD-10-CM

## 2018-08-10 DIAGNOSIS — O09299 Supervision of pregnancy with other poor reproductive or obstetric history, unspecified trimester: Secondary | ICD-10-CM | POA: Diagnosis not present

## 2018-08-10 DIAGNOSIS — Z3A37 37 weeks gestation of pregnancy: Secondary | ICD-10-CM

## 2018-08-10 DIAGNOSIS — O24313 Unspecified pre-existing diabetes mellitus in pregnancy, third trimester: Secondary | ICD-10-CM

## 2018-08-10 DIAGNOSIS — O0993 Supervision of high risk pregnancy, unspecified, third trimester: Secondary | ICD-10-CM

## 2018-08-10 DIAGNOSIS — O0990 Supervision of high risk pregnancy, unspecified, unspecified trimester: Secondary | ICD-10-CM

## 2018-08-10 DIAGNOSIS — O99333 Smoking (tobacco) complicating pregnancy, third trimester: Secondary | ICD-10-CM

## 2018-08-10 NOTE — Progress Notes (Signed)
I connected with  Crystal Castillo on 08/10/18 at 11:15 AM EDT by MyChart and verified that I am speaking with the correct person using two identifiers.   I discussed the limitations, risks, security and privacy concerns of performing an evaluation and management service by telephone and the availability of in person appointments. I also discussed with the patient that there may be a patient responsible charge related to this service. The patient expressed understanding and agreed to proceed.  [place], CMA 08/10/2018  11:18 AM

## 2018-08-10 NOTE — Procedures (Signed)
Crystal Castillo 06-09-1990 [redacted]w[redacted]d  Fetus A Non-Stress Test Interpretation for 08/10/18  Indication: Diabetes  Fetal Heart Rate A Mode: External Baseline Rate (A): 130 bpm Variability: Moderate Accelerations: 15 x 15 Decelerations: None Multiple birth?: No  Uterine Activity Mode: Palpation, Toco Contraction Frequency (min): none noted Resting Tone Palpated: Relaxed Resting Time: Adequate  Interpretation (Fetal Testing) Nonstress Test Interpretation: Reactive Comments: FHR tracing rev'd by Lavonna Rua, RN/L. Jannel Lynne, RN

## 2018-08-10 NOTE — Progress Notes (Addendum)
   [place] VIRTUAL VIDEO VISIT ENCOUNTER NOTE  Provider location: Center for [employer] at [facility]   I connected with Rise Mu on 08/10/18 at 11:15 AM EDT by MyChart Video Encounter at home and verified that I am speaking with the correct person using two identifiers.   I discussed the limitations, risks, security and privacy concerns of performing an evaluation and management service by telephone and the availability of in person appointments. I also discussed with the patient that there may be a patient responsible charge related to this service. The patient expressed understanding and agreed to proceed. Subjective:  Crystal Castillo is a 28 y.o. G1P0000 at [redacted]w[redacted]d being seen today for ongoing prenatal care.  She is currently monitored for the following issues for this high-risk pregnancy and has Major depressive disorder, recurrent severe without psychotic features ([place]); Tobacco use disorder; Borderline personality disorder ([place]); Maternal morbid obesity, antepartum ([place]); Pre-existing insulin treated diabetes mellitus during pregnancy ([place]); Supervision of high risk pregnancy, antepartum; and Drug abuse ([place]) on their problem list.  Patient reports no complaints.  Contractions: Irritability. Vag. Bleeding: None.  Movement: Present. Denies any leaking of fluid.   The following portions of the patient's history were reviewed and updated as appropriate: allergies, current medications, past family history, past medical history, past social history, past surgical history and problem list.   Objective:   Vitals:   08/10/18 1120  BP: 108/74  Pulse: 87    Fetal Status:     Movement: Present     General:  Alert, oriented and cooperative. Patient is in no acute distress.  Respiratory: Normal respiratory effort, no problems with respiration noted  Mental Status: Normal mood and affect. Normal behavior. Normal judgment and thought content.  Rest of physical exam  deferred due to type of encounter  Imaging:   Assessment and Plan:  Pregnancy: G1P0000 at [redacted]w[redacted]d  1. Maternal morbid obesity, antepartum ([place])  2. Pre-existing insulin treated diabetes mellitus during pregnancy ([place]) - Has omnipod - CBGs within range - FG: < 95 - PP: < 120 BPP today  3. Supervision of high risk pregnancy, antepartum IOL scheduled today Orders for admission placed  Term labor symptoms and general obstetric precautions including but not limited to vaginal bleeding, contractions, leaking of fluid and fetal movement were reviewed in detail with the patient. I discussed the assessment and treatment plan with the patient. The patient was provided an opportunity to ask questions and all were answered. The patient agreed with the plan and demonstrated an understanding of the instructions. The patient was advised to call back or seek an in-person office evaluation/go to MAU at [facility] for any urgent or concerning symptoms. Please refer to After Visit Summary for other counseling recommendations.   I provided 15 minutes of face-to-face time during this encounter.  Return in about 1 week (around 08/17/2018) for OB visit (MD), virtual.  Future Appointments  Date Time Provider [place]  08/10/2018  2:15 PM [place] NST [facility] MFC-US  08/10/2018  2:45 PM [place] MFC-US  08/10/2018  2:45 PM [place] 5 WH-MFCUS MFC-US  08/17/2018 10:45 AM WH-MFC NST [place] MFC-US  08/17/2018 11:00 AM [place] MFC-US  08/17/2018 11:00 AM WH-MFC [place] 3 WH-MFCUS MFC-US  08/22/2018  7:30 AM MC-LD [place] None    Sloan Leiter, [place] for [place], [place]

## 2018-08-10 NOTE — Progress Notes (Incomplete)
Error

## 2018-08-10 NOTE — Addendum Note (Signed)
Addended by: Vivien Rota on: 08/10/2018 02:21 PM   Modules accepted: Orders, SmartSet

## 2018-08-15 ENCOUNTER — Encounter (HOSPITAL_COMMUNITY): Payer: Self-pay | Admitting: *Deleted

## 2018-08-15 ENCOUNTER — Other Ambulatory Visit (HOSPITAL_COMMUNITY): Payer: Self-pay | Admitting: *Deleted

## 2018-08-15 ENCOUNTER — Telehealth (HOSPITAL_COMMUNITY): Payer: Self-pay | Admitting: *Deleted

## 2018-08-15 NOTE — Telephone Encounter (Signed)
Preadmission screen  

## 2018-08-16 ENCOUNTER — Telehealth: Payer: Self-pay | Admitting: *Deleted

## 2018-08-16 NOTE — Telephone Encounter (Signed)
Called pt regarding babyscripts.  Per review, pt has not logged BP since 06/25/18.  Pt did not pick up.  Left voicemail advising pt that she was being contacted regarding babyscripts and requesting she contact the clinic.  Mychart message sent.

## 2018-08-17 ENCOUNTER — Other Ambulatory Visit: Payer: Self-pay | Admitting: Family Medicine

## 2018-08-17 ENCOUNTER — Encounter (HOSPITAL_COMMUNITY): Payer: Self-pay

## 2018-08-17 ENCOUNTER — Ambulatory Visit (HOSPITAL_COMMUNITY): Payer: Medicaid Other | Admitting: *Deleted

## 2018-08-17 ENCOUNTER — Other Ambulatory Visit: Payer: Self-pay

## 2018-08-17 ENCOUNTER — Ambulatory Visit (HOSPITAL_COMMUNITY)
Admission: RE | Admit: 2018-08-17 | Discharge: 2018-08-17 | Disposition: A | Discharge status: Home / Self Care | Payer: Medicaid Other | Source: Ambulatory Visit | Attending: Obstetrics and Gynecology | Admitting: Obstetrics and Gynecology

## 2018-08-17 VITALS — BP 105/61 | HR 98 | Temp 97.9°F

## 2018-08-17 DIAGNOSIS — Z3A38 38 weeks gestation of pregnancy: Secondary | ICD-10-CM

## 2018-08-17 DIAGNOSIS — O99333 Smoking (tobacco) complicating pregnancy, third trimester: Secondary | ICD-10-CM | POA: Diagnosis not present

## 2018-08-17 DIAGNOSIS — O24119 Pre-existing diabetes mellitus, type 2, in pregnancy, unspecified trimester: Secondary | ICD-10-CM | POA: Insufficient documentation

## 2018-08-17 DIAGNOSIS — O24113 Pre-existing diabetes mellitus, type 2, in pregnancy, third trimester: Secondary | ICD-10-CM | POA: Diagnosis not present

## 2018-08-17 DIAGNOSIS — O09299 Supervision of pregnancy with other poor reproductive or obstetric history, unspecified trimester: Secondary | ICD-10-CM | POA: Diagnosis not present

## 2018-08-17 DIAGNOSIS — O09293 Supervision of pregnancy with other poor reproductive or obstetric history, third trimester: Secondary | ICD-10-CM

## 2018-08-17 DIAGNOSIS — O99213 Obesity complicating pregnancy, third trimester: Secondary | ICD-10-CM | POA: Diagnosis not present

## 2018-08-17 DIAGNOSIS — Z362 Encounter for other antenatal screening follow-up: Secondary | ICD-10-CM

## 2018-08-17 DIAGNOSIS — Z794 Long term (current) use of insulin: Secondary | ICD-10-CM | POA: Diagnosis present

## 2018-08-17 NOTE — Procedures (Signed)
Crystal Castillo 06-Nov-1990 [redacted]w[redacted]d  Fetus A Non-Stress Test Interpretation for 08/17/18  Indication: Diabetes  Fetal Heart Rate A Mode: External Baseline Rate (A): 135 bpm Variability: Moderate Accelerations: 15 x 15 Decelerations: None Multiple birth?: No  Uterine Activity Mode: Toco Contraction Frequency (min): Irreg UC with mild UI noted Contraction Duration (sec): 20-80 Contraction Quality: Mild Resting Tone Palpated: Relaxed Resting Time: Adequate  Interpretation (Fetal Testing) Nonstress Test Interpretation: Reactive Comments: FHR tracing rev'd by Dr. Gertie Exon

## 2018-08-18 ENCOUNTER — Telehealth (INDEPENDENT_AMBULATORY_CARE_PROVIDER_SITE_OTHER): Payer: Medicaid Other | Admitting: Obstetrics and Gynecology

## 2018-08-18 ENCOUNTER — Other Ambulatory Visit (HOSPITAL_COMMUNITY)
Admission: RE | Admit: 2018-08-18 | Discharge: 2018-08-18 | Disposition: A | Discharge status: Home / Self Care | Payer: Medicaid Other | Source: Ambulatory Visit | Attending: Obstetrics and Gynecology | Admitting: Obstetrics and Gynecology

## 2018-08-18 ENCOUNTER — Other Ambulatory Visit: Payer: Self-pay

## 2018-08-18 DIAGNOSIS — O24313 Unspecified pre-existing diabetes mellitus in pregnancy, third trimester: Secondary | ICD-10-CM

## 2018-08-18 DIAGNOSIS — Z01812 Encounter for preprocedural laboratory examination: Secondary | ICD-10-CM | POA: Insufficient documentation

## 2018-08-18 DIAGNOSIS — Z1159 Encounter for screening for other viral diseases: Secondary | ICD-10-CM | POA: Diagnosis not present

## 2018-08-18 DIAGNOSIS — Z794 Long term (current) use of insulin: Secondary | ICD-10-CM

## 2018-08-18 DIAGNOSIS — Z3A38 38 weeks gestation of pregnancy: Secondary | ICD-10-CM

## 2018-08-18 DIAGNOSIS — O24319 Unspecified pre-existing diabetes mellitus in pregnancy, unspecified trimester: Secondary | ICD-10-CM

## 2018-08-18 DIAGNOSIS — O99213 Obesity complicating pregnancy, third trimester: Secondary | ICD-10-CM | POA: Diagnosis not present

## 2018-08-18 LAB — SARS CORONAVIRUS 2 (TAT 6-24 HRS): SARS Coronavirus 2: NEGATIVE

## 2018-08-18 NOTE — MAU Note (Signed)
Here for covid swab test- has induction scheduled. Asymptomatic. Pt denies any symptoms. Tolerated swab well.  

## 2018-08-18 NOTE — Progress Notes (Signed)
   TELEHEALTH VIRTUAL OBSTETRICS VISIT ENCOUNTER NOTE  I connected with Crystal Castillo on 08/18/18 at  8:35 AM EDT by telephone at home and verified that I am speaking with the correct person using two identifiers.   I discussed the limitations, risks, security and privacy concerns of performing an evaluation and management service by telephone and the availability of in person appointments. I also discussed with the patient that there may be a patient responsible charge related to this service. The patient expressed understanding and agreed to proceed.  Subjective:  Crystal Castillo is a 28 y.o. G1P0000 at [redacted]w[redacted]d being followed for ongoing prenatal care.  She is currently monitored for the following issues for this high-risk pregnancy and has Major depressive disorder, recurrent severe without psychotic features ([place]); Tobacco use disorder; Borderline personality disorder ([place]); Maternal morbid obesity, antepartum ([place]); Pre-existing insulin treated diabetes mellitus during pregnancy ([place]); Supervision of high risk pregnancy, antepartum; and Drug abuse ([place]) on their problem list.  Patient reports no complaints. Reports fetal movement. Denies any contractions, bleeding or leaking of fluid.   The following portions of the patient's history were reviewed and updated as appropriate: allergies, current medications, past family history, past medical history, past social history, past surgical history and problem list.   Objective:   General:  Alert, oriented and cooperative.   Mental Status: Normal mood and affect perceived. Normal judgment and thought content.  Rest of physical exam deferred due to type of encounter  Assessment and Plan:  Pregnancy: G1P0000 at [redacted]w[redacted]d 1. Maternal morbid obesity, antepartum ([place])  2. Pre-existing insulin treated diabetes mellitus during pregnancy ([place])  Fasting's 80-90, with one reading greater than 95 Lunch 63, 68 Dinner 76, 119 inconsistent with BS monitoring.   Continue ASA BP WNL Patient to go to MAU on 7/5 For foley bulb insertion; discussed this process with the patient. Induction scheduled 7/6 @ 0730 AM. Start evening primrose oil  Inpatient nexplanon desired   Term labor symptoms and general obstetric precautions including but not limited to vaginal bleeding, contractions, leaking of fluid and fetal movement were reviewed in detail with the patient.  I discussed the assessment and treatment plan with the patient. The patient was provided an opportunity to ask questions and all were answered. The patient agreed with the plan and demonstrated an understanding of the instructions. The patient was advised to call back or seek an in-person office evaluation/go to MAU at [facility] for any urgent or concerning symptoms. Please refer to After Visit Summary for other counseling recommendations.   I provided 13 minutes of non-face-to-face time during this encounter.  No follow-ups on file.  Future Appointments  Date Time Provider [place]  08/18/2018 10:40 AM MC-MAU 1 MC-INDC None  08/22/2018  7:30 AM MC-LD [place] None    Noni Saupe, NP [facility] [employer] [place]

## 2018-08-18 NOTE — Progress Notes (Signed)
I connected with  Crystal Castillo on 08/18/18 at  8:35 AM EDT by telephone / MyChart Video and verified that I am speaking with the correct person using two identifiers.   I discussed the limitations, risks, security and privacy concerns of performing an evaluation and management service by telephone and the availability of in person appointments. I also discussed with the patient that there may be a patient responsible charge related to this service. The patient expressed understanding and agreed to proceed.  Alric Seton, [place] 08/18/2018  8:38 AM

## 2018-08-21 ENCOUNTER — Inpatient Hospital Stay (EMERGENCY_DEPARTMENT_HOSPITAL)
Admission: AD | Admit: 2018-08-21 | Discharge: 2018-08-21 | Disposition: A | Discharge status: Home / Self Care | Payer: Medicaid Other | Source: Home / Self Care | Attending: Family Medicine | Admitting: Family Medicine

## 2018-08-21 ENCOUNTER — Other Ambulatory Visit: Payer: Self-pay

## 2018-08-21 ENCOUNTER — Encounter (HOSPITAL_COMMUNITY): Payer: Self-pay | Admitting: *Deleted

## 2018-08-21 DIAGNOSIS — O24113 Pre-existing diabetes mellitus, type 2, in pregnancy, third trimester: Secondary | ICD-10-CM | POA: Insufficient documentation

## 2018-08-21 DIAGNOSIS — Z538 Procedure and treatment not carried out for other reasons: Secondary | ICD-10-CM

## 2018-08-21 DIAGNOSIS — Z3A38 38 weeks gestation of pregnancy: Secondary | ICD-10-CM

## 2018-08-21 DIAGNOSIS — E119 Type 2 diabetes mellitus without complications: Secondary | ICD-10-CM | POA: Insufficient documentation

## 2018-08-21 NOTE — MAU Note (Signed)
Crystal Castillo is a 28 y.o. at [redacted]w[redacted]d here in MAU reporting:  For foley bulb placement Denies any complaints at this time.  Vitals:   08/21/18 1725  BP: 138/74  Pulse: 82  Resp: 18  Temp: 98.1 F (36.7 C)  SpO2: 100%     FHT: +FM Lab orders placed from triage: none

## 2018-08-21 NOTE — Discharge Instructions (Signed)

## 2018-08-21 NOTE — MAU Provider Note (Signed)
PRENATAL VISIT NOTE  Subjective:  Crystal Castillo is a 28 y.o. G1P0000 at 3561w6d being seen today for outpatient Foley Bulb Ripening. Is scheduled IOL for Type 2 DM 08/22/18 at 0730. She is currently monitored for the following issues for this high-risk pregnancy and has Major depressive disorder, recurrent severe without psychotic features ([name]); Tobacco use disorder; Borderline personality disorder ([name]); Maternal morbid obesity, antepartum ([name]); Pre-existing insulin treated diabetes mellitus during pregnancy ([name]); Supervision of high risk pregnancy, antepartum; and Drug abuse ([name]) on their problem list.  Patient reports no complaints.   . Vag. Bleeding: None.   . Denies leaking of fluid.   The following portions of the patient's history were reviewed and updated as appropriate: allergies, current medications, past family history, past medical history, past social history, past surgical history and problem list. Problem list updated.  Objective:   Vitals:   08/21/18 1724 08/21/18 1725 08/21/18 1757  BP:  138/74 126/70  Pulse:  82 87  Resp:  18   Temp:  98.1 F (36.7 C)   TempSrc:  Oral   SpO2:  100%   Weight: 133.7 kg      Fetal Status:           General:  Alert, oriented and cooperative. Patient is in no acute distress.  Skin: Skin is warm and dry. No rash noted.   Cardiovascular: Normal heart rate noted  Respiratory: Normal respiratory effort, no problems with respiration noted  Abdomen: Soft, gravid, appropriate for gestational age.        Pelvic: Cervical exam performed        Extremities: Normal range of motion.     Mental Status:  Normal mood and affect. Normal behavior. Normal judgment and thought content.  Procedure: Patient informed of R/B/A of procedure. NST was performed and was reactive prior to procedure. NST:  EFM: Baseline: 135 bpm, Variability: Good {> 6 bpm), Accelerations: Reactive and Decelerations: Absent Toco: irregular, mild Procedure done to begin  ripening of the cervix prior to admission for induction of labor. Appropriate time out taken. The patient was placed in the lithotomy position and the cervix brought into view with sterile speculum. A ring forcep was used to attempt to guide the 22 F foley balloon through the internal os of the cervix, but was unsuccessful. Attempt was also made to place foley during cervical exam. Internal os not open enough to permit placement. Pt tolerated well. Explained that cervix was too closed to place foley. NST:  EFM Baseline: 135 bpm, Variability: Good {> 6 bpm), Accelerations: Reactive and Decelerations: Absent  Toco: irregular, mild There were no signs of tachysystole or hypertonus. All equipment was removed and accounted for. The patient tolerated the procedure well.  Assessment and Plan:  Pregnancy: G1P0000 at 6961w6d 1. Procedure and treatment not carried out for other reasons   2. Type 2 diabetes mellitus affecting pregnancy in third trimester, antepartum    Plan Follow-up Information    [facility] .   Specialty: Obstetrics and Gynecology Contact information: [address] [id number] [address] [place] [id number] [phone]       [facility] Labor and Delivery Follow up on 08/22/2018.   Specialty: Obstetrics and Gynecology Why: at 7:15 am for 7:30 induction of labor  Contact information: [address] [id number] mc [place] [id number] [phone]         Allergies as of 08/21/2018      Reactions   Quetiapine  Itching   Sulfa Antibiotics Nausea Only   Sulfa Antibiotics Nausea Only      Medication List    STOP taking these medications   insulin NPH Human 100 UNIT/ML injection Commonly known as: NOVOLIN N   Insulin Syringes (Disposable) U-100 0.5 ML Misc     TAKE these medications   Accu-Chek FastClix Lancets Misc 1 Device by Percutaneous route 4 (four) times daily.   aspirin EC 81 MG tablet Take  1 tablet (81 mg total) by mouth daily.   calcium carbonate 500 MG chewable tablet Commonly known as: TUMS - dosed in mg elemental calcium Chew 1 tablet by mouth 4 (four) times daily as needed for indigestion or heartburn.   folic acid 403 MCG tablet Commonly known as: FOLVITE Take 400 mcg by mouth daily.   glucose blood test strip Check blood glucose four times per day   insulin lispro 100 UNIT/ML injection Commonly known as: HUMALOG Inject 0.5 mLs (50 Units total) into the skin daily. Via pump and to cover meals/snacks as directed   magnesium oxide 400 MG tablet Commonly known as: MAG-OX Take 1 tablet (400 mg total) by mouth daily.   OmniPod Dash 5 Pack Pods Misc 1 Units by Does not apply route every 3 (three) days.   PrePLUS 27-1 MG Tabs Take 1 tablet by mouth daily.   Tylenol 325 MG tablet Generic drug: acetaminophen Take 650 mg by mouth every 6 (six) hours as needed.         [place], [place] 08/21/2018 6:50 PM

## 2018-08-22 ENCOUNTER — Inpatient Hospital Stay (HOSPITAL_COMMUNITY): Payer: Medicaid Other

## 2018-08-22 ENCOUNTER — Other Ambulatory Visit: Payer: Self-pay

## 2018-08-22 ENCOUNTER — Inpatient Hospital Stay (HOSPITAL_COMMUNITY)
Admission: RE | Admit: 2018-08-22 | Discharge: 2018-08-25 | DRG: 806 | Disposition: A | Discharge status: Home / Self Care | Payer: Medicaid Other | Attending: Obstetrics and Gynecology | Admitting: Obstetrics and Gynecology

## 2018-08-22 ENCOUNTER — Encounter (HOSPITAL_COMMUNITY): Payer: Self-pay | Admitting: *Deleted

## 2018-08-22 DIAGNOSIS — Z794 Long term (current) use of insulin: Secondary | ICD-10-CM

## 2018-08-22 DIAGNOSIS — O2412 Pre-existing diabetes mellitus, type 2, in childbirth: Secondary | ICD-10-CM | POA: Diagnosis present

## 2018-08-22 DIAGNOSIS — O24419 Gestational diabetes mellitus in pregnancy, unspecified control: Secondary | ICD-10-CM | POA: Diagnosis present

## 2018-08-22 DIAGNOSIS — O99214 Obesity complicating childbirth: Secondary | ICD-10-CM | POA: Diagnosis present

## 2018-08-22 DIAGNOSIS — F17200 Nicotine dependence, unspecified, uncomplicated: Secondary | ICD-10-CM | POA: Diagnosis present

## 2018-08-22 DIAGNOSIS — Z30017 Encounter for initial prescription of implantable subdermal contraceptive: Secondary | ICD-10-CM

## 2018-08-22 DIAGNOSIS — F603 Borderline personality disorder: Secondary | ICD-10-CM | POA: Diagnosis present

## 2018-08-22 DIAGNOSIS — Z87891 Personal history of nicotine dependence: Secondary | ICD-10-CM | POA: Diagnosis not present

## 2018-08-22 DIAGNOSIS — E119 Type 2 diabetes mellitus without complications: Secondary | ICD-10-CM | POA: Diagnosis present

## 2018-08-22 DIAGNOSIS — Z3A38 38 weeks gestation of pregnancy: Secondary | ICD-10-CM | POA: Diagnosis not present

## 2018-08-22 DIAGNOSIS — Z3A39 39 weeks gestation of pregnancy: Secondary | ICD-10-CM | POA: Diagnosis not present

## 2018-08-22 DIAGNOSIS — F332 Major depressive disorder, recurrent severe without psychotic features: Secondary | ICD-10-CM | POA: Diagnosis present

## 2018-08-22 DIAGNOSIS — O24319 Unspecified pre-existing diabetes mellitus in pregnancy, unspecified trimester: Secondary | ICD-10-CM

## 2018-08-22 DIAGNOSIS — Z9641 Presence of insulin pump (external) (internal): Secondary | ICD-10-CM | POA: Diagnosis present

## 2018-08-22 DIAGNOSIS — O99344 Other mental disorders complicating childbirth: Secondary | ICD-10-CM | POA: Diagnosis present

## 2018-08-22 DIAGNOSIS — Z538 Procedure and treatment not carried out for other reasons: Secondary | ICD-10-CM | POA: Diagnosis not present

## 2018-08-22 DIAGNOSIS — O24113 Pre-existing diabetes mellitus, type 2, in pregnancy, third trimester: Secondary | ICD-10-CM | POA: Diagnosis not present

## 2018-08-22 DIAGNOSIS — O99210 Obesity complicating pregnancy, unspecified trimester: Secondary | ICD-10-CM | POA: Diagnosis present

## 2018-08-22 DIAGNOSIS — F1910 Other psychoactive substance abuse, uncomplicated: Secondary | ICD-10-CM | POA: Diagnosis present

## 2018-08-22 LAB — CBC
HCT: 40 % (ref 36.0–46.0)
Hemoglobin: 13.6 g/dL (ref 12.0–15.0)
MCH: 31.7 pg (ref 26.0–34.0)
MCHC: 34 g/dL (ref 30.0–36.0)
MCV: 93.2 fL (ref 80.0–100.0)
Platelets: 222 10*3/uL (ref 150–400)
RBC: 4.29 MIL/uL (ref 3.87–5.11)
RDW: 13.5 % (ref 11.5–15.5)
WBC: 13.1 10*3/uL — ABNORMAL HIGH (ref 4.0–10.5)
nRBC: 0 % (ref 0.0–0.2)

## 2018-08-22 LAB — TYPE AND SCREEN
ABO/RH(D): A POS
Antibody Screen: NEGATIVE

## 2018-08-22 LAB — GLUCOSE, CAPILLARY
Glucose-Capillary: 152 mg/dL — ABNORMAL HIGH (ref 70–99)
Glucose-Capillary: 92 mg/dL (ref 70–99)
Glucose-Capillary: 154 mg/dL — ABNORMAL HIGH (ref 70–99)
Glucose-Capillary: 74 mg/dL (ref 70–99)
Glucose-Capillary: 172 mg/dL — ABNORMAL HIGH (ref 70–99)

## 2018-08-22 LAB — ABO/RH: ABO/RH(D): A POS

## 2018-08-22 LAB — RPR: RPR Ser Ql: NONREACTIVE

## 2018-08-22 MED ORDER — TERBUTALINE SULFATE 1 MG/ML IJ SOLN: 0.2500 mg | Freq: Once | INTRAMUSCULAR | Status: DC | PRN

## 2018-08-22 MED ORDER — OXYTOCIN BOLUS FROM INFUSION
500.0000 mL | Freq: Once | INTRAVENOUS | Status: AC
  Administered 2018-08-23: 06:00:00 500 mL via INTRAVENOUS

## 2018-08-22 MED ORDER — LIDOCAINE HCL (PF) 1 % IJ SOLN
30.0000 mL | INTRAMUSCULAR | Status: AC | PRN
  Administered 2018-08-23: 06:00:00 30 mL via SUBCUTANEOUS
  Filled 2018-08-22: qty 30

## 2018-08-22 MED ORDER — ACETAMINOPHEN 325 MG PO TABS
650.0000 mg | ORAL_TABLET | ORAL | Status: DC | PRN
  Administered 2018-08-22: 18:00:00 650 mg via ORAL
  Filled 2018-08-22: qty 2

## 2018-08-22 MED ORDER — LACTATED RINGERS IV SOLN: 500.0000 mL | INTRAVENOUS | Status: DC | PRN

## 2018-08-22 MED ORDER — OXYCODONE-ACETAMINOPHEN 5-325 MG PO TABS: 1.0000 | ORAL_TABLET | ORAL | Status: DC | PRN

## 2018-08-22 MED ORDER — PROMETHAZINE HCL 25 MG/ML IJ SOLN
12.5000 mg | Freq: Once | INTRAMUSCULAR | Status: AC
  Administered 2018-08-22: 23:00:00 12.5 mg via INTRAVENOUS
  Filled 2018-08-22: qty 1

## 2018-08-22 MED ORDER — OXYCODONE-ACETAMINOPHEN 5-325 MG PO TABS: 2.0000 | ORAL_TABLET | ORAL | Status: DC | PRN

## 2018-08-22 MED ORDER — BUTORPHANOL TARTRATE 1 MG/ML IJ SOLN
2.0000 mg | Freq: Once | INTRAMUSCULAR | Status: AC
  Administered 2018-08-22: 23:00:00 2 mg via INTRAVENOUS
  Filled 2018-08-22: qty 2

## 2018-08-22 MED ORDER — INSULIN ASPART 100 UNIT/ML ~~LOC~~ SOLN
1.0000 [IU] | Freq: Three times a day (TID) | SUBCUTANEOUS | Status: DC
  Administered 2018-08-22: 20:00:00 2 [IU] via SUBCUTANEOUS

## 2018-08-22 MED ORDER — MISOPROSTOL 50MCG HALF TABLET
50.0000 ug | ORAL_TABLET | ORAL | Status: DC
  Administered 2018-08-22 (×2): 50 ug via BUCCAL
  Filled 2018-08-22: qty 1

## 2018-08-22 MED ORDER — FENTANYL CITRATE (PF) 100 MCG/2ML IJ SOLN
100.0000 ug | INTRAMUSCULAR | Status: DC | PRN
  Administered 2018-08-22 – 2018-08-23 (×3): 100 ug via INTRAVENOUS
  Filled 2018-08-22 (×3): qty 2

## 2018-08-22 MED ORDER — LACTATED RINGERS IV SOLN
INTRAVENOUS | Status: DC
  Administered 2018-08-22 – 2018-08-23 (×3): via INTRAVENOUS

## 2018-08-22 MED ORDER — MISOPROSTOL 25 MCG QUARTER TABLET
25.0000 ug | ORAL_TABLET | ORAL | Status: DC | PRN
  Administered 2018-08-22 (×2): 25 ug via VAGINAL
  Filled 2018-08-22 (×4): qty 1

## 2018-08-22 MED ORDER — OXYTOCIN 40 UNITS IN NORMAL SALINE INFUSION - SIMPLE MED
2.5000 [IU]/h | INTRAVENOUS | Status: DC
  Filled 2018-08-22: qty 1000

## 2018-08-22 MED ORDER — INSULIN ASPART 100 UNIT/ML ~~LOC~~ SOLN
0.0000 [IU] | SUBCUTANEOUS | Status: DC
  Administered 2018-08-22: 4 [IU] via SUBCUTANEOUS
  Administered 2018-08-22: 15:00:00 3 [IU] via SUBCUTANEOUS

## 2018-08-22 MED ORDER — SOD CITRATE-CITRIC ACID 500-334 MG/5ML PO SOLN: 30.0000 mL | ORAL | Status: DC | PRN

## 2018-08-22 MED ORDER — ONDANSETRON HCL 4 MG/2ML IJ SOLN
4.0000 mg | Freq: Four times a day (QID) | INTRAMUSCULAR | Status: DC | PRN
  Administered 2018-08-22: 4 mg via INTRAVENOUS
  Filled 2018-08-22: qty 2

## 2018-08-22 NOTE — Progress Notes (Signed)
LABOR PROGRESS NOTE  Crystal Castillo is a 28 y.o. G1P0000 at [redacted]w[redacted]d  admitted for  IOL for T2DM (insulin pump).  Subjective: She is in bed, eating and doing well. She is comfortable.   Objective: BP (!) 109/51   Pulse 74   Resp 16   Ht 5\' 7"  (1.702 m)   Wt 133.7 kg   LMP 11/22/2017 (Approximate)   BMI 46.17 kg/m  or  Vitals:   08/22/18 0740 08/22/18 0759 08/22/18 1215  BP: 109/64  (!) 109/51  Pulse: 100  74  Resp: 18  16  Weight:  133.7 kg   Height:  5\' 7"  (1.702 m)     Dilation: Closed Effacement (%): Thick Station: Ballotable Presentation: Vertex Exam by:: J.Thornton, RNC FHT: baseline rate 135bpm, minimal to moderate varibility, 15 x15 acels, no decels Toco: minimal  Labs: Lab Results  Component Value Date   WBC 13.1 (H) 08/22/2018   HGB 13.6 08/22/2018   HCT 40.0 08/22/2018   MCV 93.2 08/22/2018   PLT 222 08/22/2018    Patient Active Problem List   Diagnosis Date Noted  . Maternal morbid obesity, antepartum ([place]) 01/20/2018  . Pre-existing insulin treated diabetes mellitus during pregnancy ([place]) 01/20/2018  . Supervision of high risk pregnancy, antepartum 01/20/2018  . Tobacco use disorder 08/19/2016  . Borderline personality disorder ([place]) 08/19/2016  . Major depressive disorder, recurrent severe without psychotic features ([place]) 08/18/2016  . Drug abuse ([place]) 05/09/2016    Assessment / Plan: 28 y.o. G1P0000 at [redacted]w[redacted]d here for  IOL for T2DM (insulin pump).  Labor: Cervix remains unfavorable. Cytotec x2 given. Continue serial cervical exams. On next exam consider FB placement. Fetal Wellbeing:  Cat I Pain Control:  Maternally supported, planning for an epidura; Anticipated MOD:  Vaginal  Crystal Castillo, D.O. Family Medicine Resident PGY-1 08/22/2018, 1:09 PM

## 2018-08-22 NOTE — Progress Notes (Signed)
LABOR PROGRESS NOTE  Donda L Fielder is a 28 y.o. G1P0000 at [redacted]w[redacted]d admitted for IOL for Type II DM.   Subjective: Doing alright, feeling contractions and cramping.   Objective: BP 118/66   Pulse 66   Temp 98 F (36.7 C) (Oral)   Resp 18   Ht 5\' 7"  (1.702 m)   Wt 133.7 kg   LMP 11/22/2017 (Approximate)   BMI 46.17 kg/m  or  Vitals:   08/22/18 1631 08/22/18 1815 08/22/18 1950 08/22/18 2102  BP: 108/64 121/60 131/64 118/66  Pulse: 73 75 70 66  Resp: 18 18 18 18   Temp:  (!) 96.8 F (36 C) 98 F (36.7 C)   TempSrc:  Axillary Oral   Weight:      Height:        Dilation: 3 Effacement (%): Thick Cervical Position: Posterior Station: Ballotable Presentation: Vertex Exam by:: Dr Higinio Plan FHT: baseline rate 130, moderate varibility, +acel, -decel Toco: Irritable   Labs: Lab Results  Component Value Date   WBC 13.1 (H) 08/22/2018   HGB 13.6 08/22/2018   HCT 40.0 08/22/2018   MCV 93.2 08/22/2018   PLT 222 08/22/2018    Patient Active Problem List   Diagnosis Date Noted  . Maternal morbid obesity, antepartum ([place]) 01/20/2018  . Pre-existing insulin treated diabetes mellitus during pregnancy ([place]) 01/20/2018  . Supervision of high risk pregnancy, antepartum 01/20/2018  . Tobacco use disorder 08/19/2016  . Borderline personality disorder ([place]) 08/19/2016  . Major depressive disorder, recurrent severe without psychotic features ([place]) 08/18/2016  . Drug abuse ([place]) 05/09/2016    Assessment / Plan: 28 y.o. G1P0000 at [redacted]w[redacted]d here for IOL for type II DM.   Labor: FB out approximately 2 hours ago. Requires continued cervical ripening, placed buccal Cytotec (4th dose). Will likely transition to pit 2x2 on next check.  Fetal Wellbeing:  Cat 1 strip  Pain Control:  Desires epidural  Anticipated MOD:  SVD   1. Type II DM: Chronic, stable.  Last CBG 74.  --CBG q4  --SSI as needed and with meals  --Transition to insulin gtt when started on pit   Darrelyn Hillock, D.O. Family  Medicine PGY-2 08/22/2018, 9:55 PM

## 2018-08-22 NOTE — H&P (Addendum)
LABOR AND DELIVERY ADMISSION HISTORY AND PHYSICAL NOTE  Crystal Castillo is a 28 y.o. female G1P0000 with IUP at 10637w0d by LMP presenting for IOL for T2DM (insulin pump).She reports positive fetal movement. She denies leakage of fluid or vaginal bleeding.   Ultrasounds  9w1: viability U/S  19w0: anterior placenta, EFW 55%, no fetal anomalies noted]  22w1: normal fetal ECHO  23w0: EFW 64%  30w1: EFW 1713g, 73%  38w2: EFW 3457g, 65%  Prenatal History/Complications:  [facility]  T2DM (OMNI pod) - A1C 6.0% earlier this year  History of drug use  History of depression  Past Medical History: Past Medical History:  Diagnosis Date  . Anorexia nervosa with bulimia    just finished treatment in August 2012 for this  . Anxiety   . Bartholin's cyst   . Depression   . Diabetes mellitus    not currently treated for this. Pt states she was told she had DM, but lost @  150 lbs in 9 months.  . Diabetes mellitus without complication ([name])   . Obesity   . PTSD (post-traumatic stress disorder)     Past Surgical History: Past Surgical History:  Procedure Laterality Date  . TONSILLECTOMY    . WISDOM TOOTH EXTRACTION      Obstetrical History: OB History    Gravida  1   Para  0   Term  0   Preterm  0   AB  0   Living  0     SAB  0   TAB  0   Ectopic  0   Multiple      Live Births              Social History: Social History   Socioeconomic History  . Marital status: Married    Spouse name: Not on file  . Number of children: Not on file  . Years of education: Not on file  . Highest education level: Not on file  Occupational History  . Not on file  Social Needs  . Financial resource strain: Not on file  . Food insecurity    Worry: Never true    Inability: Never true  . Transportation needs    Medical: No    Non-medical: No  Tobacco Use  . Smoking status: Former Smoker    Packs/day: 0.50    Types: Cigarettes  . Smokeless tobacco: Never Used  .  Tobacco comment: rarely smokes  Substance and Sexual Activity  . Alcohol use: Not Currently    Comment: occasional drinker - 2 or 3 times a week  . Drug use: No  . Sexual activity: Yes    Birth control/protection: None  Lifestyle  . Physical activity    Days per week: Not on file    Minutes per session: Not on file  . Stress: Not on file  Relationships  . Social Musicianconnections    Talks on phone: Not on file    Gets together: Not on file    Attends religious service: Not on file    Active member of club or organization: Not on file    Attends meetings of clubs or organizations: Not on file    Relationship status: Not on file  Other Topics Concern  . Not on file  Social History Narrative   ** Merged History Encounter **        Family History: Family History  Problem Relation Age of Onset  . Alcohol abuse Father   .  Hypertension Father   . COPD Father   . Diabetes Mother   . Asthma Mother     Allergies: Allergies  Allergen Reactions  . Quetiapine Itching  . Sulfa Antibiotics Nausea Only  . Sulfa Antibiotics Nausea Only    Medications Prior to Admission  Medication Sig Dispense Refill Last Dose  . ACCU-CHEK FASTCLIX LANCETS MISC 1 Device by Percutaneous route 4 (four) times daily. 100 each 12   . acetaminophen (TYLENOL) 325 MG tablet Take 650 mg by mouth every 6 (six) hours as needed.     Marland Kitchen aspirin EC 81 MG tablet Take 1 tablet (81 mg total) by mouth daily. 90 tablet 6   . calcium carbonate (TUMS - DOSED IN MG ELEMENTAL CALCIUM) 500 MG chewable tablet Chew 1 tablet by mouth 4 (four) times daily as needed for indigestion or heartburn.     . folic acid (FOLVITE) 725 MCG tablet Take 400 mcg by mouth daily.     Marland Kitchen glucose blood test strip Check blood glucose four times per day 100 each 12   . Insulin Disposable Pump (OMNIPOD DASH 5 PACK) MISC 1 Units by Does not apply route every 3 (three) days. 10 each 11   . insulin lispro (HUMALOG) 100 UNIT/ML injection Inject 0.5 mLs (50  Units total) into the skin daily. Via pump and to cover meals/snacks as directed 20 mL 11   . magnesium oxide (MAG-OX) 400 MG tablet Take 1 tablet (400 mg total) by mouth daily. 30 tablet 6   . Prenatal Vit-Fe Fumarate-FA (PREPLUS) 27-1 MG TABS Take 1 tablet by mouth daily. 30 tablet 6      Review of Systems  All systems reviewed and negative except as stated in HPI  Physical Exam Blood pressure 109/64, pulse 100, resp. rate 18, height 5\' 7"  (1.702 m), weight 133.7 kg, last menstrual period 11/22/2017. General appearance: alert, oriented Lungs: normal respiratory effort Abdomen: soft, non-tender; gravid, FH appropriate for GA Extremities: No calf swelling or tenderness Presentation: cephalic Fetal monitoring: 140 bpm/ moderate variability/15 x 15 acels/ no decels Uterine activity: minimal Dilation: Closed Exam by:: Epifanio Lesches, RNC  Prenatal labs: ABO, Rh: --/--/A POS (07/06 0745) Antibody: NEG (07/06 0745) Rubella: 1.66 (12/05 0945) RPR: Non Reactive (05/13 1127)  HBsAg: Negative (12/05 0945)  HIV: Non Reactive (05/13 1127)  GC/Chlamydia: Negative GBS: Negative (06/17 0000)  2-hr GTT: 111 (05/13 1127) Genetic screening: Normal Anatomy US: Normal - low risk NIPS. Face, lips, and heart not well visualized. Echo performed and fetal heart anatomy was found to be normal.  Prenatal Transfer Tool  Maternal Diabetes: Yes T2DM Insulin pumpo Genetic Screening: Normal Maternal Ultrasounds/Referrals: Other: Normal, some limited cardiac views Fetal Ultrasounds or other Referrals:  Fetal echo Maternal Substance Abuse:  Yes:  Type: Smoker, Other: Drug use Significant Maternal Medications:  Meds include: Other: Insulin Significant Maternal Lab Results: Group B Strep negative  Results for orders placed or performed during the hospital encounter of 08/22/18 (from the past 24 hour(s))  Glucose, capillary   Collection Time: 08/22/18  7:44 AM  Result Value Ref Range    Glucose-Capillary 152 (H) 70 - 99 mg/dL  CBC   Collection Time: 08/22/18  7:45 AM  Result Value Ref Range   WBC 13.1 (H) 4.0 - 10.5 K/uL   RBC 4.29 3.87 - 5.11 MIL/uL   Hemoglobin 13.6 12.0 - 15.0 g/dL   HCT 40.0 36.0 - 46.0 %   MCV 93.2 80.0 - 100.0 fL   MCH 31.7  26.0 - 34.0 pg   MCHC 34.0 30.0 - 36.0 g/dL   RDW 16.113.5 09.611.5 - 04.515.5 %   Platelets 222 150 - 400 K/uL   nRBC 0.0 0.0 - 0.2 %  Type and screen   Collection Time: 08/22/18  7:45 AM  Result Value Ref Range   ABO/RH(D) A POS    Antibody Screen NEG    Sample Expiration      08/25/2018,2359 Performed at [facility], [address] [place], [place] [address]     Patient Active Problem List   Diagnosis Date Noted  . Maternal morbid obesity, antepartum ([name]) 01/20/2018  . Pre-existing insulin treated diabetes mellitus during pregnancy ([name]) 01/20/2018  . Supervision of high risk pregnancy, antepartum 01/20/2018  . Tobacco use disorder 08/19/2016  . Borderline personality disorder ([name]) 08/19/2016  . Major depressive disorder, recurrent severe without psychotic features ([name]) 08/18/2016  . Drug abuse ([name]) 05/09/2016    Assessment: Claudean L Babs Castillo is a 28 y.o. G1P0000 at 5276w0d here for IOL for T2DM (insulin pump).  #Labor: Cytotec 25mcg placed vaginally. Serial cervical exams. Attempt FB placement with next check. #Pain: Minimal and maternally supported #FWB: Cat I. Cephalic by BSUS. EFW 65%, about 8lbs by [place].  #ID: GBS negative #MOF: breast #MOC:nexplanon #T2DM: will monitor CBG every 4 hours. Stop home insulin pump. Cover with SSI while early in induction and still eating then will likely transition to insulin gtt.   Lavonda JumboSimone Autry-Lott, DO, PGY-1  Attestation: I have seen this patient and agree with the resident's documentation. I have examined them separately, and we have discussed the plan of care. I have updated the H&P as needed.   Cristal DeerLaurel S. Earlene PlaterWallace, DO OB/GYN Fellow

## 2018-08-22 NOTE — Progress Notes (Signed)
OB/GYN Faculty Practice: Labor Progress Note  Subjective: Doing well, starting to feel more cramping.   Objective: BP 108/64   Pulse 73   Resp 18   Ht 5\' 7"  (1.702 m)   Wt 133.7 kg   LMP 11/22/2017 (Approximate)   BMI 46.17 kg/m  Gen: well-appearing, NAD Dilation: 1 Effacement (%): Thick Station: Ballotable Presentation: Vertex Exam by:: Derrill Memo, CNM  Assessment and Plan: 28 y.o. G1P0000 [redacted]w[redacted]d here for IOL for Type II DM.   Labor: Induction started this morning with vaginal cytotec. Reviewed procedure for FB, patient amenable. Now 1cm, FB placed by CNM Derrill Memo. -- pain control: managing well  -- PPH Risk: medium  A2GDM: BG stable. On insulin pump at home. Currently covering meals and doing SSI coverage with CBG checks.  -- continue CBG checks every 4 hours   Fetal Well-Being: EFW 65%. Cephalic by sutures.  -- Category I - continous fetal monitoring  -- GBS negative   Crystal Lindon S. Juleen China, DO OB/GYN Fellow, Faculty Practice  5:36 PM

## 2018-08-23 ENCOUNTER — Encounter: Payer: Self-pay | Admitting: *Deleted

## 2018-08-23 ENCOUNTER — Encounter (HOSPITAL_COMMUNITY): Payer: Self-pay

## 2018-08-23 ENCOUNTER — Inpatient Hospital Stay (HOSPITAL_COMMUNITY): Payer: Medicaid Other | Admitting: Anesthesiology

## 2018-08-23 DIAGNOSIS — O2412 Pre-existing diabetes mellitus, type 2, in childbirth: Secondary | ICD-10-CM

## 2018-08-23 DIAGNOSIS — Z3A39 39 weeks gestation of pregnancy: Secondary | ICD-10-CM

## 2018-08-23 DIAGNOSIS — Z9641 Presence of insulin pump (external) (internal): Secondary | ICD-10-CM

## 2018-08-23 DIAGNOSIS — E119 Type 2 diabetes mellitus without complications: Secondary | ICD-10-CM

## 2018-08-23 DIAGNOSIS — Z794 Long term (current) use of insulin: Secondary | ICD-10-CM

## 2018-08-23 LAB — GLUCOSE, CAPILLARY
Glucose-Capillary: 145 mg/dL — ABNORMAL HIGH (ref 70–99)
Glucose-Capillary: 160 mg/dL — ABNORMAL HIGH (ref 70–99)
Glucose-Capillary: 186 mg/dL — ABNORMAL HIGH (ref 70–99)
Glucose-Capillary: 162 mg/dL — ABNORMAL HIGH (ref 70–99)
Glucose-Capillary: 95 mg/dL (ref 70–99)
Glucose-Capillary: 95 mg/dL (ref 70–99)
Glucose-Capillary: 113 mg/dL — ABNORMAL HIGH (ref 70–99)

## 2018-08-23 MED ORDER — FENTANYL-BUPIVACAINE-NACL 0.5-0.125-0.9 MG/250ML-% EP SOLN
12.0000 mL/h | EPIDURAL | Status: DC | PRN
  Filled 2018-08-23: qty 250

## 2018-08-23 MED ORDER — PRENATAL MULTIVITAMIN CH
1.0000 | ORAL_TABLET | Freq: Every day | ORAL | Status: DC
  Administered 2018-08-24 – 2018-08-25 (×2): 1 via ORAL
  Filled 2018-08-23 (×2): qty 1

## 2018-08-23 MED ORDER — SODIUM CHLORIDE (PF) 0.9 % IJ SOLN
INTRAMUSCULAR | Status: DC | PRN
  Administered 2018-08-23: 12 mL/h via EPIDURAL

## 2018-08-23 MED ORDER — INSULIN ASPART 100 UNIT/ML ~~LOC~~ SOLN: 0.0000 [IU] | Freq: Every day | SUBCUTANEOUS | Status: DC

## 2018-08-23 MED ORDER — EPHEDRINE 5 MG/ML INJ: 10.0000 mg | INTRAVENOUS | Status: DC | PRN

## 2018-08-23 MED ORDER — IBUPROFEN 600 MG PO TABS
600.0000 mg | ORAL_TABLET | Freq: Four times a day (QID) | ORAL | Status: DC
  Administered 2018-08-23 – 2018-08-25 (×9): 600 mg via ORAL
  Filled 2018-08-23 (×9): qty 1

## 2018-08-23 MED ORDER — SIMETHICONE 80 MG PO CHEW
80.0000 mg | CHEWABLE_TABLET | ORAL | Status: DC | PRN
  Administered 2018-08-23: 13:00:00 80 mg via ORAL
  Filled 2018-08-23 (×2): qty 1

## 2018-08-23 MED ORDER — DIPHENHYDRAMINE HCL 50 MG/ML IJ SOLN: 12.5000 mg | INTRAMUSCULAR | Status: DC | PRN

## 2018-08-23 MED ORDER — LIDOCAINE HCL (PF) 1 % IJ SOLN
INTRAMUSCULAR | Status: DC | PRN
  Administered 2018-08-23: 5 mL via EPIDURAL

## 2018-08-23 MED ORDER — TETANUS-DIPHTH-ACELL PERTUSSIS 5-2.5-18.5 LF-MCG/0.5 IM SUSP: 0.5000 mL | Freq: Once | INTRAMUSCULAR | Status: DC

## 2018-08-23 MED ORDER — INSULIN GLARGINE 100 UNIT/ML ~~LOC~~ SOLN
10.0000 [IU] | Freq: Every day | SUBCUTANEOUS | Status: DC
  Administered 2018-08-23: 10:00:00 10 [IU] via SUBCUTANEOUS
  Filled 2018-08-23 (×2): qty 0.1

## 2018-08-23 MED ORDER — LACTATED RINGERS IV SOLN
500.0000 mL | Freq: Once | INTRAVENOUS | Status: AC
  Administered 2018-08-23: 04:00:00 500 mL via INTRAVENOUS

## 2018-08-23 MED ORDER — OXYTOCIN 40 UNITS IN NORMAL SALINE INFUSION - SIMPLE MED
1.0000 m[IU]/min | INTRAVENOUS | Status: DC
  Administered 2018-08-23: 2 m[IU]/min via INTRAVENOUS

## 2018-08-23 MED ORDER — COCONUT OIL OIL: 1.0000 "application " | TOPICAL_OIL | Status: DC | PRN

## 2018-08-23 MED ORDER — PHENYLEPHRINE 40 MCG/ML (10ML) SYRINGE FOR IV PUSH (FOR BLOOD PRESSURE SUPPORT)
80.0000 ug | PREFILLED_SYRINGE | INTRAVENOUS | Status: AC | PRN
  Administered 2018-08-23 (×2): 80 ug via INTRAVENOUS
  Administered 2018-08-23: 05:00:00 20 ug via INTRAVENOUS
  Filled 2018-08-23: qty 10

## 2018-08-23 MED ORDER — BENZOCAINE-MENTHOL 20-0.5 % EX AERO
1.0000 "application " | INHALATION_SPRAY | CUTANEOUS | Status: DC | PRN
  Administered 2018-08-23: 1 via TOPICAL
  Filled 2018-08-23: qty 56

## 2018-08-23 MED ORDER — METFORMIN HCL ER 500 MG PO TB24: 500.0000 mg | ORAL_TABLET | Freq: Every day | ORAL | Status: DC

## 2018-08-23 MED ORDER — ONDANSETRON HCL 4 MG PO TABS: 4.0000 mg | ORAL_TABLET | ORAL | Status: DC | PRN

## 2018-08-23 MED ORDER — DEXTROSE IN LACTATED RINGERS 5 % IV SOLN
INTRAVENOUS | Status: DC
  Administered 2018-08-23: 03:00:00 via INTRAVENOUS

## 2018-08-23 MED ORDER — ONDANSETRON HCL 4 MG/2ML IJ SOLN: 4.0000 mg | INTRAMUSCULAR | Status: DC | PRN

## 2018-08-23 MED ORDER — DIPHENHYDRAMINE HCL 25 MG PO CAPS: 25.0000 mg | ORAL_CAPSULE | Freq: Four times a day (QID) | ORAL | Status: DC | PRN

## 2018-08-23 MED ORDER — WITCH HAZEL-GLYCERIN EX PADS: 1.0000 "application " | MEDICATED_PAD | CUTANEOUS | Status: DC | PRN

## 2018-08-23 MED ORDER — SODIUM CHLORIDE 0.9% FLUSH: 3.0000 mL | Freq: Two times a day (BID) | INTRAVENOUS | Status: DC

## 2018-08-23 MED ORDER — SODIUM CHLORIDE 0.9 % IV SOLN: 250.0000 mL | INTRAVENOUS | Status: DC | PRN

## 2018-08-23 MED ORDER — PHENYLEPHRINE 40 MCG/ML (10ML) SYRINGE FOR IV PUSH (FOR BLOOD PRESSURE SUPPORT): 80.0000 ug | PREFILLED_SYRINGE | INTRAVENOUS | Status: DC | PRN

## 2018-08-23 MED ORDER — INSULIN ASPART 100 UNIT/ML ~~LOC~~ SOLN: 0.0000 [IU] | Freq: Three times a day (TID) | SUBCUTANEOUS | Status: DC

## 2018-08-23 MED ORDER — INSULIN REGULAR(HUMAN) IN NACL 100-0.9 UT/100ML-% IV SOLN
INTRAVENOUS | Status: DC
  Administered 2018-08-23: 03:00:00 0.9 [IU]/h via INTRAVENOUS
  Filled 2018-08-23 (×2): qty 100

## 2018-08-23 MED ORDER — METFORMIN HCL 500 MG PO TABS
500.0000 mg | ORAL_TABLET | Freq: Every day | ORAL | Status: DC
  Administered 2018-08-24: 19:00:00 500 mg via ORAL
  Filled 2018-08-23 (×2): qty 1

## 2018-08-23 MED ORDER — MEASLES, MUMPS & RUBELLA VAC IJ SOLR: 0.5000 mL | Freq: Once | INTRAMUSCULAR | Status: DC

## 2018-08-23 MED ORDER — ZOLPIDEM TARTRATE 5 MG PO TABS: 5.0000 mg | ORAL_TABLET | Freq: Every evening | ORAL | Status: DC | PRN

## 2018-08-23 MED ORDER — ACETAMINOPHEN 325 MG PO TABS: 650.0000 mg | ORAL_TABLET | ORAL | Status: DC | PRN

## 2018-08-23 MED ORDER — DIBUCAINE (PERIANAL) 1 % EX OINT: 1.0000 "application " | TOPICAL_OINTMENT | CUTANEOUS | Status: DC | PRN

## 2018-08-23 MED ORDER — SODIUM CHLORIDE 0.9% FLUSH: 3.0000 mL | INTRAVENOUS | Status: DC | PRN

## 2018-08-23 MED ORDER — SENNOSIDES-DOCUSATE SODIUM 8.6-50 MG PO TABS
2.0000 | ORAL_TABLET | ORAL | Status: DC
  Administered 2018-08-24 (×2): 2 via ORAL
  Filled 2018-08-23 (×2): qty 2

## 2018-08-23 NOTE — Anesthesia Procedure Notes (Signed)
Epidural Patient location during procedure: OB Start time: 08/23/2018 4:16 AM End time: 08/23/2018 4:34 AM  Staffing Anesthesiologist: Barnet Glasgow, MD Performed: anesthesiologist   Preanesthetic Checklist Completed: patient identified, site marked, surgical consent, pre-op evaluation, timeout performed, IV checked, risks and benefits discussed and monitors and equipment checked  Epidural Patient position: sitting Prep: site prepped and draped and DuraPrep Patient monitoring: continuous pulse ox and blood pressure Approach: midline Location: L3-L4 Injection technique: LOR air  Needle:  Needle type: Tuohy  Needle gauge: 17 G Needle length: 9 cm and 9 Needle insertion depth: 5 cm cm Catheter type: closed end flexible Catheter size: 19 Gauge Catheter at skin depth: 10 cm Test dose: negative  Assessment Events: blood not aspirated, injection not painful, no injection resistance, negative IV test and no paresthesia  Additional Notes Patient identified. Risks/Benefits/Options discussed with patient including but not limited to bleeding, infection, nerve damage, paralysis, failed block, incomplete pain control, headache, blood pressure changes, nausea, vomiting, reactions to medication both or allergic, itching and postpartum back pain. Confirmed with bedside nurse the patient's most recent platelet count. Confirmed with patient that they are not currently taking any anticoagulation, have any bleeding history or any family history of bleeding disorders. Patient expressed understanding and wished to proceed. All questions were answered. Sterile technique was used throughout the entire procedure. Please see nursing notes for vital signs. Test dose was given through epidural needle and negative prior to continuing to dose epidural or start infusion. Warning signs of high block given to the patient including shortness of breath, tingling/numbness in hands, complete motor block, or any  concerning symptoms with instructions to call for help. Patient was given instructions on fall risk and not to get out of bed. All questions and concerns addressed with instructions to call with any issues. 3 Attempt (S) . Patient tolerated procedure well.

## 2018-08-23 NOTE — Lactation Note (Signed)
This note was copied from a baby's chart. Lactation Consultation Note  Patient Name: Girl Gurtha Picker Today's Date: 08/23/2018  P1, 2 hour female infant, mom is Type 2 DM with insulin. Infant had 2 stools no voids yet. Mom feeding choice at admission was breastfeeding mom doesn't want to supplement with formula but exclusively breastfeed infant .  Per mom, infant is not latching to breast, is sleepy and reluctant to breastfeed. Mom made attempts to latch infant to breast but infant has not been latching,so mom has been doing hand expression and giving infant approximately  2-3 ml  of colostrum by spoon. Mom has large breast and she is  short shafted nipple she was given 20 mm NS by Nurse. The 20 mm NS appears to small so  LC refitted  Mom with  24 mm and help assist with applying to nipple. 24 mm NS was pre-filled with small amount of colostrum, mom attempted to latch infant on right breast using the football hold, but infant was reluctant to latch, infant was only hold breast in mouth and not sucking. LC had infant  to suckle on gloved finger, infant only did a  few sucks then stopped. LC reviewed and assisted mom with hand expression and infant was given 8 ml of EBM by spoon. Mom will continue to do as much STS as possible, continue to work towards latching infant to breast by hunger cues. If is still not latching to breast  mom will continue to give EBM, LC gave sheet according to supplemental guidelines based on infant's age/ hours of life. Mom knows to breastfeed according to hunger cues, 8 to 12 times within 24 hours and on demand. Mom will use DEBP every 3 hours for 15 minutes to help with breast induction, stimulation and will do hand expression with pumping to help with milk volume. Mom prefers to supplement infant with her  own breast milk she doesn't want to use formula at this time. Mom shown how to use DEBP & how to disassemble, clean, & reassemble parts.   Maternal Data     Feeding Feeding Type: Other (comment)(mom/baby asleep)  LATCH Score                   Interventions    Lactation Tools Discussed/Used     Consult Status      Vicente Serene 08/23/2018, 8:39 PM

## 2018-08-23 NOTE — Progress Notes (Deleted)
Patient: Jamea L Kable MRN: [id number]  GBS status: Negative   Patient is a 28 y.o. now G1P1001 s/p NSVD at [redacted]w[redacted]d, who was admitted for IOL for Type 2 DM. SROM 1h 20m prior to delivery with clear fluid.    Delivery Note At 5:37 AM a viable female was delivered via Vaginal, Spontaneous (Presentation: left OA.)  APGAR: 8, 9; weight pending.   Placenta status: spontaneous, intact.  Cord: 3 vessel with the following complications: none.   Anesthesia: Epidural, lidocaine  Episiotomy: None Lacerations: 2nd degree;Vaginal; 1st degree vaginal  Suture Repair: 3.0 monocryl  Est. Blood Loss (mL): 359  Head delivered left OA. No nuchal cord present. Shoulder and body delivered in usual fashion. Infant with spontaneous cry, placed on mother's abdomen, dried and bulb suctioned. Cord clamped x 2 after 1-minute delay, and cut by family member. Cord blood drawn. Placenta delivered spontaneously with gentle cord traction. Fundus firm with massage and Pitocin. Perineum inspected and found to have 1st and 2nd degree vaginal lacerations, which were repaired with 3.0 monocryl with good hemostasis achieved.'   Mom to postpartum.  Baby to Couplet care / Skin to Skin.  Samantha N Beard 08/23/2018, 6:37 AM 

## 2018-08-23 NOTE — Anesthesia Preprocedure Evaluation (Signed)
Anesthesia Evaluation  Patient identified by MRN, date of birth, ID band Patient awake    Reviewed: Allergy & Precautions, NPO status , Patient's Chart, lab work & pertinent test results  Airway Mallampati: II  TM Distance: >3 FB Neck ROM: Full    Dental no notable dental hx.    Pulmonary neg pulmonary ROS, former smoker,    Pulmonary exam normal breath sounds clear to auscultation       Cardiovascular Exercise Tolerance: Good Normal cardiovascular exam Rhythm:Regular Rate:Normal     Neuro/Psych    GI/Hepatic   Endo/Other  diabetes, Type 2  Renal/GU      Musculoskeletal   Abdominal (+) + obese,   Peds  Hematology   Anesthesia Other Findings   Reproductive/Obstetrics (+) Pregnancy                             Lab Results  Component Value Date   WBC 13.1 (H) 08/22/2018   HGB 13.6 08/22/2018   HCT 40.0 08/22/2018   MCV 93.2 08/22/2018   PLT 222 08/22/2018    Anesthesia Physical Anesthesia Plan  ASA: III  Anesthesia Plan: Epidural   Post-op Pain Management:    Induction:   PONV Risk Score and Plan:   Airway Management Planned:   Additional Equipment:   Intra-op Plan:   Post-operative Plan:   Informed Consent: I have reviewed the patients History and Physical, chart, labs and discussed the procedure including the risks, benefits and alternatives for the proposed anesthesia with the patient or authorized representative who has indicated his/her understanding and acceptance.       Plan Discussed with:   Anesthesia Plan Comments:         Anesthesia Quick Evaluation

## 2018-08-23 NOTE — Anesthesia Postprocedure Evaluation (Signed)
Anesthesia Post Note  Patient: Ebony L Overbey  Procedure(s) Performed: AN AD HOC LABOR EPIDURAL     Patient location during evaluation: Mother Baby Anesthesia Type: Epidural Level of consciousness: awake and alert Pain management: pain level controlled Vital Signs Assessment: post-procedure vital signs reviewed and stable Respiratory status: spontaneous breathing, nonlabored ventilation and respiratory function stable Cardiovascular status: stable Postop Assessment: no headache, epidural receding, patient able to bend at knees, adequate PO intake, able to ambulate and no apparent nausea or vomiting Anesthetic complications: no Comments: C/O of slight backache, but getting better per patient    Last Vitals:  Vitals:   08/23/18 1123 08/23/18 1245  BP: 112/66 (!) 114/56  Pulse: 76 78  Resp:  20  Temp:  36.9 C  SpO2:      Last Pain:  Vitals:   08/23/18 1245  TempSrc:   PainSc: 0-No pain   Pain Goal:                   Talitha Givens

## 2018-08-23 NOTE — Progress Notes (Signed)
Inpatient Diabetes Program Recommendations  AACE/ADA: New Consensus Statement on Inpatient Glycemic Control (2015)  Target Ranges:  Prepandial:   less than 140 mg/dL      Peak postprandial:   less than 180 mg/dL (1-2 hours)      Critically ill patients:  140 - 180 mg/dL   Lab Results  Component Value Date   GLUCAP 162 (H) 08/23/2018   HGBA1C 6.0 (H) 03/10/2018    Review of Glycemic Control Results for JUSTYNA, TIMONEY (MRN [id number]) as of 08/23/2018 09:34  Ref. Range 08/23/2018 02:26 08/23/2018 03:26 08/23/2018 05:32 08/23/2018 06:39  Glucose-Capillary Latest Ref Range: 70 - 99 mg/dL 145 (H) 160 (H) 186 (H) 162 (H)   Diabetes history: Type 2 DM Outpatient Diabetes medications: Humalog via Omnipod  Current orders for Inpatient glycemic control: IV insulin  Inpatient Diabetes Program Recommendations:    Spoke with patient regarding outpatient management of DM. Patient verified she is type 2 DM and was diagnosed when she was 28 years old. Has been on Omnipod insulin pump during pregnancy. Reports being on Metformin prior to pregnancy, however, did not take consistently.  At this time, consider:  -Lantus 10 units 1-2 hours prior to discontinuation of IV insulin, then QD following.  - Novolog 0-9 untis TID & HS - Metformin 500 mg QD  Pre-Delivery insulin pump settings are as follows: Basal insulin  0000-0000 1.0 units/hour Total daily basal insulin: 24 units/24 hours Carb Coverage 1:10 1 unit for every 10 grams of carbohydrates Insulin Sensitivity 1:12 1 unit drops blood glucose 12 mg/dl Target Glucose Goals 0000-0000 80-120 mg/dl   In talking with the patient, her A!C  Was >10% prior to pregnancy. Patient agreeable to using SQ insulin as outpatient. Explained Lantus duration, risk for hypoglycemia following delivery, recommended frequency of checking CBGs at home, and the importance of following up with PCP for DM. Will place CM consult, as patient may have lapsed coverage following  postpartum visit. Patient verbalized understanding of information discussed and states that he does not have any further questions related to diabetes at this time.  Thanks, Bronson Curb, MSN, RNC-OB Diabetes Coordinator [phone] (8a-5p)

## 2018-08-23 NOTE — Discharge Summary (Signed)
Postpartum Discharge Summary     Patient Name: Crystal Castillo DOB: 1990/11/15 MRN: [id number]  Date of admission: 08/22/2018 Delivering Provider: Patriciaann Clan   Date of discharge: 08/25/2018  Admitting diagnosis: No admission diagnoses are documented for this encounter. Intrauterine pregnancy: [redacted]w[redacted]d     Secondary diagnosis:  Principal Problem:   Pre-existing insulin treated diabetes mellitus during pregnancy ([place]) Active Problems:   Major depressive disorder, recurrent severe without psychotic features ([place])   Tobacco use disorder   Borderline personality disorder ([place])   Maternal morbid obesity, antepartum ([place])   Drug abuse ([place])  Additional problems: none     Discharge diagnosis: Term Pregnancy Delivered and Type 2 DM                                                                                                Post partum procedures:Nexplanon placement - see separate procedure note  Augmentation: Pitocin, Cytotec and Foley Balloon  Complications: None  Hospital course:  Induction of Labor With Vaginal Delivery   28 y.o. yo G1P0000 at [redacted]w[redacted]d was admitted to the hospital 08/22/2018 for induction of labor.  Indication for induction: TYPE 2 DM.  Patient had a labor course that included the usual IOL methods, progressing to SVD in approx 22hrs. Membrane Rupture Time/Date: 4:17 AM ,08/23/2018   Intrapartum Procedures: Episiotomy: None [1]                                         Lacerations:  2nd degree [3];Vaginal [6];1st degree [2];Perineal [11]  Patient had delivery of a Viable infant.  Information for the patient's newborn:  Avaeh, Ewer Girl Creedence [[id number]]  Delivery Method: Vaginal, Spontaneous(Filed from Delivery Summary)    08/23/2018  Details of delivery can be found in separate delivery note.  Patient had a routine postpartum course and was transitioned to her OmniPod after delivery. She had a Nexplanon placement on PPD# 2 and also a SW consult. Patient is discharged home  08/25/18.  Magnesium Sulfate recieved: No BMZ received: No  Physical exam  Vitals:   08/24/18 0300 08/24/18 1358 08/24/18 2140 08/25/18 0621  BP: 95/69 (!) 111/55 (!) 106/57 105/64  Pulse: 69 75 72 75  Resp: 18 18 18 16   Temp: 98.1 F (36.7 C) 97.9 F (36.6 C) 98.2 F (36.8 C) 97.8 F (36.6 C)  TempSrc: Oral Oral  Oral  SpO2:   98% 99%  Weight:      Height:       General: alert, cooperative and no distress Lochia: appropriate Uterine Fundus: firm Incision: N/A DVT Evaluation: No evidence of DVT seen on physical exam. No significant calf/ankle edema. Labs: Lab Results  Component Value Date   WBC 13.1 (H) 08/22/2018   HGB 13.6 08/22/2018   HCT 40.0 08/22/2018   MCV 93.2 08/22/2018   PLT 222 08/22/2018   CMP Latest Ref Rng & Units 06/29/2018  Glucose 65 - 99 mg/dL 111(H)  BUN 6 - 20 mg/dL 7  Creatinine 0.57 - 1.00  mg/dL 1.61(W0.51(L)  Sodium 960134 - 454144 mmol/L 137  Potassium 3.5 - 5.2 mmol/L 4.1  Chloride 96 - 106 mmol/L 102  CO2 20 - 29 mmol/L 20  Calcium 8.7 - 10.2 mg/dL 9.2  Total Protein 6.0 - 8.5 g/dL 6.2  Total Bilirubin 0.0 - 1.2 mg/dL <0.9<0.2  Alkaline Phos 39 - 117 IU/L 74  AST 0 - 40 IU/L 17  ALT 0 - 32 IU/L 19    Discharge instruction: per After Visit Summary and "Baby and Me Booklet".  After visit meds:  Allergies as of 08/25/2018      Reactions   Quetiapine Itching   Sulfa Antibiotics Nausea Only   Sulfa Antibiotics Nausea Only      Medication List    TAKE these medications   Accu-Chek FastClix Lancets Misc 1 Device by Percutaneous route 4 (four) times daily.   aspirin EC 81 MG tablet Take 1 tablet (81 mg total) by mouth daily.   calcium carbonate 500 MG chewable tablet Commonly known as: TUMS - dosed in mg elemental calcium Chew 1 tablet by mouth 4 (four) times daily as needed for indigestion or heartburn.   folic acid 400 MCG tablet Commonly known as: FOLVITE Take 400 mcg by mouth daily.   glucose blood test strip Check blood glucose four  times per day   insulin lispro 100 UNIT/ML injection Commonly known as: HUMALOG Inject 0.5 mLs (50 Units total) into the skin daily. Via pump and to cover meals/snacks as directed   magnesium oxide 400 MG tablet Commonly known as: MAG-OX Take 1 tablet (400 mg total) by mouth daily.   metFORMIN 500 MG tablet Commonly known as: GLUCOPHAGE Take 1 tablet (500 mg total) by mouth daily with supper.   OmniPod Dash 5 Pack Pods Misc 1 Units by Does not apply route every 3 (three) days.   PrePLUS 27-1 MG Tabs Take 1 tablet by mouth daily.   Tylenol 325 MG tablet Generic drug: acetaminophen Take 650 mg by mouth every 6 (six) hours as needed.     Please schedule this patient for Postpartum visit in: 4-6 weeks with the following provider: Any provider  For C/S patients schedule nurse incision check in weeks 2 weeks: no  High risk pregnancy complicated by: Type 2 DM  Delivery mode: SVD  Anticipated Birth Control: Nexplanon- wants to have it placed inpt  PP Procedures needed: none  Schedule Integrated BH visit: yes- offer to pt   Diet: combination reg/carb mod   Activity: Advance as tolerated. Pelvic rest for 6 weeks.   Outpatient follow up:4 weeks Follow up Appt:No future appointments. Follow up Visit: Follow-up Information    [facility]. Schedule an appointment as soon as possible for a visit in 4 week(s).   Specialty: Obstetrics and Gynecology Why: Make appointment to be seen in 4 weeks for postpartum care  Contact information: [address], [address] [id number] [place]c [place] [phone]            Newborn Data: Live born female  Birth Weight: 3059 gm (6lb 11.9oz) APGAR: 8, 9  Newborn Delivery   Birth date/time: 08/23/2018 05:37:00 Delivery type: Vaginal, Spontaneous      Baby Feeding: Breast Disposition:home with mother   08/25/2018 Sharyon CableVeronica C Nazarene Bunning, CNM

## 2018-08-23 NOTE — Lactation Note (Signed)
This note was copied from a baby's chart. Lactation Consultation Note  Patient Name: Girl Phyillis Dascoli Today's Date: 08/23/2018 Reason for consult: Initial assessment;Term;Primapara;1st time breastfeeding  P1 mother whose infant is now 35 hours old.  Baby was asleep on mother's chest when I arrived.  Mother has attempted to latch and feed but baby remains sleepy.  Reassured mother that this is "typical" behavior for a baby at this age.  Encouraged to feed 8-12 times/24 hours or sooner if baby shows feeding cues.  Reviewed cues.  Mother has been taught hand expression and I encouraged this before/after feedings to help increase milk supply.  Colostrum container provided for any EBM mother may obtain with hand expression.  Milk storage times reviewed and finger feeding demonstrated.  Mom made aware of O/P services, breastfeeding support groups, community resources, and our phone # for post-discharge questions.  Mother is well prepared with breast feeding tools.  She has a NS and shells, manual pump and DEBP here at the hospital.  She stated she likes to "prepare ahead of time."  Upon assessment of her breasts I noted her breasts to be large, soft and non tender and nipples are everted and intact.  At this time I see no reason why she would have to use any of these tools.  Her nipples are short shafted but tissue is compressible.  We discussed basic positions and I suggested she try the football hold when baby is ready to feed.    Mother will call RN/LC for latch assistance as needed.  She will be a "stay at home" mother and will use her DEBP for home use.  Father present.   Maternal Data Formula Feeding for Exclusion: No Has patient been taught Hand Expression?: Yes Does the patient have breastfeeding experience prior to this delivery?: No  Feeding Feeding Type: Breast Milk  LATCH Score Latch: Too sleepy or reluctant, no latch achieved, no sucking elicited.  Audible Swallowing: None  Type of  Nipple: Flat  Comfort (Breast/Nipple): Soft / non-tender  Hold (Positioning): Assistance needed to correctly position infant at breast and maintain latch.  LATCH Score: 4  Interventions    Lactation Tools Discussed/Used     Consult Status Consult Status: Follow-up Date: 08/24/18 Follow-up type: In-patient    Torien Ramroop R Jamila Slatten 08/23/2018, 2:15 PM

## 2018-08-23 NOTE — Progress Notes (Signed)
LABOR PROGRESS NOTE  Khristie L Tindol is a 28 y.o. G1P0000 at [redacted]w[redacted]d  admitted for IOL for Type 2 DM.   Subjective: Doing well, trying to sleep. Feeling contractions more regularly.   Objective: BP (!) 112/41   Pulse 87   Temp 98.1 F (36.7 C) (Oral)   Resp 20   Ht 5\' 7"  (1.702 m)   Wt 133.7 kg   LMP 11/22/2017 (Approximate)   SpO2 98%   BMI 46.17 kg/m  or  Vitals:   08/22/18 2315 08/22/18 2320 08/22/18 2325 08/23/18 0110  BP:   (!) 106/59 (!) 112/41  Pulse:   78 87  Resp:   17 20  Temp:    98.1 F (36.7 C)  TempSrc:    Oral  SpO2: 96% 98%    Weight:      Height:        Dilation: 4 Effacement (%): 60 Cervical Position: Posterior Station: Ballotable Presentation: Vertex Exam by:: Dr. Higinio Plan  FHT: baseline rate 130, moderate varibility, +acel, -decel Toco: irregular   Labs: Lab Results  Component Value Date   WBC 13.1 (H) 08/22/2018   HGB 13.6 08/22/2018   HCT 40.0 08/22/2018   MCV 93.2 08/22/2018   PLT 222 08/22/2018    Patient Active Problem List   Diagnosis Date Noted  . Maternal morbid obesity, antepartum ([place]) 01/20/2018  . Pre-existing insulin treated diabetes mellitus during pregnancy ([place]) 01/20/2018  . Supervision of high risk pregnancy, antepartum 01/20/2018  . Tobacco use disorder 08/19/2016  . Borderline personality disorder ([place]) 08/19/2016  . Major depressive disorder, recurrent severe without psychotic features ([place]) 08/18/2016  . Drug abuse ([place]) 05/09/2016    Assessment / Plan: 28 y.o. G1P0000 at [redacted]w[redacted]d here for IOL due to T2DM.   Labor: Progressing well s/p foley + cytotec x4. Will transition to pit 2x2.  Fetal Wellbeing:  Cat 1 Pain Control:  Requests epidural to be placed soon  Anticipated MOD:  SVD   1. T2DM: Chronic, stable.  Last glucose 172, received SSI.  --Start glucose stabilizer/insulin gtt while on pit --Monitor CBG q 1 hr  --d/c SSI and mealtime coverage    Darrelyn Hillock, D.O. Family Medicine PGY-2  08/23/2018, 2:07  AM

## 2018-08-24 LAB — GLUCOSE, CAPILLARY
Glucose-Capillary: 76 mg/dL (ref 70–99)
Glucose-Capillary: 88 mg/dL (ref 70–99)
Glucose-Capillary: 100 mg/dL — ABNORMAL HIGH (ref 70–99)
Glucose-Capillary: 127 mg/dL — ABNORMAL HIGH (ref 70–99)

## 2018-08-24 MED ORDER — LIDOCAINE HCL 1 % IJ SOLN
0.0000 mL | Freq: Once | INTRAMUSCULAR | Status: AC | PRN
  Administered 2018-08-25: 10:00:00 20 mL via INTRADERMAL
  Filled 2018-08-24: qty 20

## 2018-08-24 MED ORDER — ETONOGESTREL 68 MG ~~LOC~~ IMPL
68.0000 mg | DRUG_IMPLANT | Freq: Once | SUBCUTANEOUS | Status: AC
  Administered 2018-08-25: 10:00:00 68 mg via SUBCUTANEOUS
  Filled 2018-08-24: qty 1

## 2018-08-24 NOTE — Lactation Note (Signed)
This note was copied from a Crystal's chart. Lactation Consultation Note  Patient Name: Crystal Castillo [id number] Date: 08/24/2018 Reason for consult: Follow-up assessment;Mother's request;Difficult latch;1st time breastfeeding;Term  1824 - 1900 - Crystal Castillo paged for latch assistance. Crystal Castillo has breast fed one or two times today and received some expressed breast milk by syringe and spoon. Mom had just pumped 10 mls upon arrival. We first attempted to feed Crystal in football hold on her left breast. Crystal rooted and appeared interested but would not latch. Mom can hand express her colostrum easily. Her nipples are evert and pliable.  I offered Crystal my finger to see how she suckled. Instead of suckling she clamped down on my finger. Crystal has a bruise on her head.  I then showed mom and dad how to syringe finger feed. Crystal Crystal Castillo did well with this; she pulled 10 cc's from the syringe on her own with rhythmic suckling sequences. She then began to hiccup.   I then showed mom and dad how to pace bottle feed (they requested a bottle nipple). I reviewed risks of early artificial nipple usage.  We discussed a plan for tonight to attempt to breast feed first and then to pump/suppment after if Crystal is unable to achieve latch after 10 minutes of attempting. Mom was a bit tearful, but she seemed encouraged by this plan. I encouraged her to call tonight for assistance as needed.   Maternal Data Formula Feeding for Exclusion: No Does the patient have breastfeeding experience prior to this delivery?: No  Feeding Feeding Type: Breast Milk  LATCH Score Latch: Too sleepy or reluctant, no latch achieved, no sucking elicited.  Audible Swallowing: None  Type of Nipple: Everted at rest and after stimulation  Comfort (Breast/Nipple): Soft / non-tender  Hold (Positioning): Assistance needed to correctly position infant at breast and maintain latch.  LATCH Score: 5  Interventions Interventions: Breast  feeding basics reviewed;Assisted with latch;Hand express;Breast compression;Adjust position;Support pillows;Expressed milk(syringe fed)  Lactation Tools Discussed/Used Pump Review: Setup, frequency, and cleaning;Milk Storage   Consult Status Consult Status: Follow-up Date: 08/25/18 Follow-up type: In-patient    Crystal Castillo 08/24/2018, 7:05 PM

## 2018-08-24 NOTE — Progress Notes (Signed)
Post Partum Day 1 Subjective: no complaints, up ad lib, voiding and tolerating PO  Objective: Blood pressure 95/69, pulse 69, temperature 98.1 F (36.7 C), temperature source Oral, resp. rate 18, height 5\' 7"  (1.702 m), weight 133.7 kg, last menstrual period 11/22/2017, SpO2 97 %, unknown if currently breastfeeding.  Physical Exam:  General: alert, cooperative and no distress Lochia: appropriate Uterine Fundus: firm Incision: n/a DVT Evaluation: No evidence of DVT seen on physical exam.  Recent Labs    08/22/18 0745  HGB 13.6  HCT 40.0    Assessment/Plan: Plan for discharge tomorrow, Breastfeeding and Lactation consult   LOS: 2 days   Hansel Feinstein 08/24/2018, 8:30 AM

## 2018-08-24 NOTE — Clinical Social Work Maternal (Signed)
CLINICAL SOCIAL WORK MATERNAL/CHILD NOTE  Patient Details  Name: Crystal Castillo MRN: [id number] Date of Birth: 11/25/1990  Date:  09/19/2018  Clinical Social Worker Initiating Note:  Durward Fortes, LCSWA Date/Time: Initiated:  08/24/18/0852     Child's Name:  Crystal Castillo   Biological Parents:  Mother, Father   Need for Interpreter:  None   Reason for Referral:  Behavioral Health Concerns   Address:  [place] [address]    Phone number:  [phone] (home)     Additional phone number: none   Household Members/Support Persons (HM/SP):   Household Member/Support Person 2   HM/SP Name Relationship DOB or Age  HM/SP -1   Martinique Orlowski (FOB)  FOB   [birth year] 1989   HM/SP -2 Janiene Pellicane (MOB) MOB   02/22/1990  HM/SP -3        HM/SP -4        HM/SP -5        HM/SP -6        HM/SP -7        HM/SP -8          Natural Supports (not living in the home):      Professional Supports: None   Employment: Unemployed   Type of Work: none   Education:  Other (comment)   Homebound arranged:  n/a  Museum/gallery curator Resources:    none reported   Other Resources:  [employer]   Cultural/Religious Considerations Which May Impact Care:  none reported   Strengths:  Ability to meet basic needs , Compliance with medical plan , Home prepared for child    Psychotropic Medications:         Pediatrician:     [place]   Pediatrician List:   [place]      Pediatrician Fax Number:    Risk Factors/Current Problems:  None   Cognitive State:  Alert , Able to Concentrate , Insightful    Mood/Affect:  Calm , Comfortable , Relaxed    CSW Assessment: CSW consulted as MOB has history of depression, BPD, PTSD, and generalized anxiety. CSW spoke with MOB at bedside to address further needs. CSW entered the room and congratulated MOB and  FOB on the birth of infant.t CSW advised MOB of the HIPPA policy, MOB requested that FOB remain in the room.   CSW advised MOB of the reason for the visit. MOB reported that she has had depression her entire life. MOB reported being placed on medications in the past however she reported that she that this increased her symptoms rather than improve them. MOB expressed that has been fine with no concern about anxiety or depression .MOB scored 6 on [place] with no concerns to CSW.   MOB reported that she has been clean from substances for two years. CSW congratulated MOB on this and encouraged MOB to remain clean for infant. MOB understanding. CSW was advised that MOB has support from her spouse and family and friends. MOB reported death in the past is what led to her depression and anxiety. MOB reports currently not working but also applying for [facility]. MOB doesn't  receive food stamps. MOB expressed having all items to care for infant at this time. CSW provided MOB with information for [facility] as requested.  CSW provided education to  MOB in SIDS as well as PPD. MOB was given PPD Checklist to better manage symptoms. MOB denies any further needs.   CSW Plan/Description:  No Further Intervention Required/No Barriers to Discharge, Sudden Infant Death Syndrome (SIDS) Education, Other Information/Referral to Community Resources    Nawal Burling S Ashea Winiarski, LCSWA 08/24/2018, 9:45 AM 

## 2018-08-25 DIAGNOSIS — Z30017 Encounter for initial prescription of implantable subdermal contraceptive: Secondary | ICD-10-CM

## 2018-08-25 DIAGNOSIS — O2412 Pre-existing diabetes mellitus, type 2, in childbirth: Secondary | ICD-10-CM | POA: Diagnosis not present

## 2018-08-25 LAB — GLUCOSE, CAPILLARY: Glucose-Capillary: 75 mg/dL (ref 70–99)

## 2018-08-25 MED ORDER — METFORMIN HCL 500 MG PO TABS: 500.0000 mg | ORAL_TABLET | Freq: Every day | ORAL | 3 refills | Status: DC

## 2018-08-25 NOTE — Procedures (Signed)
POSTPARTUM PROCEDURE NOTE  Ms. Crystal Castillo is a 28 y.o. G1P1001 for Nexplanon insertion.  Last pap smear was on 12/19 and was normal. She is PPD#2 from a SVD.   Nexplanon Insertion Procedure Patient was given informed consent, she signed consent form.  Patient does understand that irregular bleeding is a very common side effect of this medication.Appropriate time out taken.  Patient's left arm was prepped and draped in the usual sterile fashion.. The ruler used to measure and mark insertion area.  Patient was prepped with alcohol swab and then injected with 3 ml of 1% lidocaine.  She was prepped with betadine, Nexplanon removed from packaging,  Device confirmed in needle, then inserted full length of needle and withdrawn per handbook instructions. Nexplanon was able to palpated in the patient's arm; patient palpated the insert herself. There was minimal blood loss.  Patient insertion site covered with guaze and a pressure bandage to reduce any bruising.  The patient tolerated the procedure well and was given post procedure instructions.    Lajean Manes, CNM 08/25/2018 11:01 AM

## 2018-08-25 NOTE — Lactation Note (Signed)
This note was copied from a baby's chart. Lactation Consultation Note  Patient Name: Crystal Castillo [id number] Date: 08/25/2018 Reason for consult: Follow-up assessment;Term Baby is 53 hours old/4% weight loss.  Mom feels much more confident with breastfeeding and states the last 3 feedings baby latched well.  Mom is post pumping some and last obtained 15 mls.  She does have a breast pump at home.  Instructed to post pump for stimulation or anytime baby doesn't go to breast.  Recommended parents use a bottle to supplement with to allow for wider mouth.  Discussed milk coming to volume and the prevention and treatment of engorgement.  Questions answered.  Reviewed lactation outpatient services and support and encouraged to call prn.  Maternal Data    Feeding Feeding Type: Breast Fed  LATCH Score                   Interventions    Lactation Tools Discussed/Used     Consult Status Consult Status: Complete Follow-up type: Call as needed    Ave Filter 08/25/2018, 10:50 AM

## 2018-09-01 ENCOUNTER — Institutional Professional Consult (permissible substitution): Payer: Medicaid Other

## 2018-09-01 ENCOUNTER — Other Ambulatory Visit: Payer: Self-pay

## 2018-09-01 ENCOUNTER — Ambulatory Visit (INDEPENDENT_AMBULATORY_CARE_PROVIDER_SITE_OTHER): Payer: Medicaid Other | Admitting: Clinical

## 2018-09-01 DIAGNOSIS — Z8659 Personal history of other mental and behavioral disorders: Secondary | ICD-10-CM | POA: Diagnosis not present

## 2018-09-01 DIAGNOSIS — Z9189 Other specified personal risk factors, not elsewhere classified: Secondary | ICD-10-CM

## 2018-09-01 DIAGNOSIS — Z87898 Personal history of other specified conditions: Secondary | ICD-10-CM | POA: Diagnosis not present

## 2018-09-01 NOTE — BH Specialist Note (Signed)
Integrated Behavioral Health via Telemedicine Video Visit  09/01/2018 ZABRINA BROTHERTON [id number]  Number of Integrated Behavioral Health visits: 1 Session Start time: 10:43  Session End time: 11:19 Total time: 35 minutes  Referring Provider: Darrol Poke, CNM Type of Visit: Video Patient/Family location: Home [facility] Provider location: [facility] All persons participating in visit: Patient Crystal Castillo and [place]  Confirmed patient's address: Yes  Confirmed patient's phone number: Yes  Any changes to demographics: No   Confirmed patient's insurance: Yes  Any changes to patient's insurance: No   Discussed confidentiality: Yes   I connected with Tyniesha L Schaum  by a video enabled telemedicine application and verified that I am speaking with the correct person using two identifiers.     I discussed the limitations of evaluation and management by telemedicine and the availability of in person appointments.  I discussed that the purpose of this visit is to provide behavioral health care while limiting exposure to the novel coronavirus.   Discussed there is a possibility of technology failure and discussed alternative modes of communication if that failure occurs.  I discussed that engaging in this video visit, they consent to the provision of behavioral healthcare and the services will be billed under their insurance.  Patient and/or legal guardian expressed understanding and consented to video visit: Yes   PRESENTING CONCERNS: Patient and/or family reports the following symptoms/concerns: Pt states her primary concern today is having a history of major depression and overdose w substances after her father passed away (2 years ago), history of anorexia and bulimia, past substance use, borderline personality disorder,  and history of being treated for the above issues with Trintellix, Wellbutrin, Zoloft, Geodon, Abilify, other medications, and therapy. Pt is currently coping well as she  adjusts to new motherhood, is eating and sleeping well, with good support from family and friends. No SI, No HI. Pt is open to psychiatry referral in the future, if symptoms of depression return.  Duration of problem: Ongoing; Severity of problem: mild  STRENGTHS (Protective Factors/Coping Skills): Self-awareness, strong social support; open to seek help, as needed  GOALS ADDRESSED: Patient will: 1.  Maintain reduction of symptoms of: anxiety and depression  2.  Increase knowledge and/or ability of: healthy habits  3.  Demonstrate ability to: Increase healthy adjustment to current life circumstances, Increase adequate support systems for patient/family and Increase motivation to adhere to plan of care  INTERVENTIONS: Interventions utilized:  Functional Assessment of ADLs, Psychoeducation and/or Health Education and Link to [employer] Standardized Assessments completed: GAD-7 and PHQ 9  ASSESSMENT: Patient currently experiencing History of depression, History of eating disorder, History of borderline personality disorder, History of substance use disorder, History of drug overdose  Patient may benefit from psychoeducation and brief therapeutic interventions regarding maintaining reduction of symptoms of depression today.  Marland Kitchen  PLAN: 1. Follow up with behavioral health clinician on : As needed 2. Behavioral recommendations:   -If symptoms of depression return, let [facility] know at [facility], and we will put in a referral to psychiatry  -Continue using daily self-coping strategies that have worked in the past -Consider attending at least one new mom support group session at either [email or web address] or [email or web address]  3. Referral(s): Integrated Orthoptist (In Clinic) and [employer] Resources:  New mom support  I discussed the assessment and treatment plan with the patient and/or parent/guardian. They were provided an opportunity to ask questions and all were  answered. They agreed with the plan and demonstrated an  understanding of the instructions.   They were advised to call back or seek an in-person evaluation if the symptoms worsen or if the condition fails to improve as anticipated.  Valetta CloseJamie C Jsiah Menta

## 2018-09-01 NOTE — BH Specialist Note (Deleted)
Integrated Behavioral Health via Telemedicine Video Visit  09/01/2018 Crystal Castillo [id number]  Number of Integrated Behavioral Health visits: 1 (2 total) Session Start time: ***  Session End time: *** Total time: {IBH Total BWLS:93734287}  Referring Provider: Darrol Poke, CNM  Type of Visit: Video Patient/Family location: *** [facility] Provider location: *** All persons participating in visit: ***  Confirmed patient's address: {YES/NO:[address]} Confirmed patient's phone number: {YES/NO:21197} Any changes to demographics: {YES/NO:21197}  Confirmed patient's insurance: {YES/NO:21197} Any changes to patient's insurance: {YES/NO:21197}  Discussed confidentiality: {YES/NO:21197}  I connected with Crystal Castillo and/or Crystal Castillo's {family members:20773} by a video enabled telemedicine application and verified that I am speaking with the correct person using two identifiers.     I discussed the limitations of evaluation and management by telemedicine and the availability of in person appointments.  I discussed that the purpose of this visit is to provide behavioral health care while limiting exposure to the novel coronavirus.   Discussed there is a possibility of technology failure and discussed alternative modes of communication if that failure occurs.  I discussed that engaging in this video visit, they consent to the provision of behavioral healthcare and the services will be billed under their insurance.  Patient and/or legal guardian expressed understanding and consented to video visit: {YES/NO:21197}  PRESENTING CONCERNS: Patient and/or family reports the following symptoms/concerns: *** Duration of problem: ***; Severity of problem: {Mild/Moderate/Severe:20260}  STRENGTHS (Protective Factors/Coping Skills): ***  GOALS ADDRESSED: Patient will: 1.  Reduce symptoms of: {IBH Symptoms:21014056}  2.  Increase knowledge and/or ability of: {IBH Patient Tools:21014057}  3.  Demonstrate  ability to: {IBH Goals:21014053}  INTERVENTIONS: Interventions utilized:  {IBH Interventions:21014054} Standardized Assessments completed: {IBH Screening Tools:21014051}  ASSESSMENT: Patient currently experiencing ***.   Patient may benefit from ***.  PLAN: 1. Follow up with behavioral health clinician on : *** 2. Behavioral recommendations: *** 3. Referral(s): {IBH Referrals:21014055}  I discussed the assessment and treatment plan with the patient and/or parent/guardian. They were provided an opportunity to ask questions and all were answered. They agreed with the plan and demonstrated an understanding of the instructions.   They were advised to call back or seek an in-person evaluation if the symptoms worsen or if the condition fails to improve as anticipated.  [name]

## 2018-09-22 ENCOUNTER — Ambulatory Visit (INDEPENDENT_AMBULATORY_CARE_PROVIDER_SITE_OTHER): Payer: Medicaid Other | Admitting: Obstetrics and Gynecology

## 2018-09-22 ENCOUNTER — Other Ambulatory Visit: Payer: Self-pay

## 2018-09-22 VITALS — BP 113/80 | HR 82 | Wt 277.0 lb

## 2018-09-22 DIAGNOSIS — Z975 Presence of (intrauterine) contraceptive device: Secondary | ICD-10-CM | POA: Insufficient documentation

## 2018-09-22 DIAGNOSIS — E119 Type 2 diabetes mellitus without complications: Secondary | ICD-10-CM | POA: Insufficient documentation

## 2018-09-22 DIAGNOSIS — Z1389 Encounter for screening for other disorder: Secondary | ICD-10-CM | POA: Diagnosis not present

## 2018-09-22 NOTE — Progress Notes (Addendum)
Subjective:     Crystal Castillo is a 28 y.o. female who presents for a postpartum visit. She is 4 weeks postpartum following a SVD. I have fully reviewed the prenatal and intrapartum course. The delivery was at 39w gestational weeks. Outcome: spontaneous vaginal delivery. Anesthesia: epidural. Postpartum course has been uncomplicated. Baby's course has been uncomplicated. Baby is feeding by breast. Bleeding staining only. Bowel function is normal. Bladder function is normal. Patient is not sexually active. Contraception method is Nexplanon. Postpartum depression screening: negative.  The following portions of the patient's history were reviewed and updated as appropriate: allergies, current medications, past family history, past medical history, past social history, past surgical history and problem list.  Review of Systems Pertinent items are noted in HPI.   Objective:    BP (!) 144/81   Pulse 70   Wt 277 lb (125.6 kg)   BMI 43.38 kg/m   General:  alert and cooperative  Lungs: clear to auscultation bilaterally  Heart:  regular rate and rhythm, S1, S2 normal, no murmur, click, rub or gallop  Abdomen: soft, non-tender; bowel sounds normal; no masses,  no organomegaly        Assessment:   Normal postpartum exam. Pap smear not done at today's visit.  Last pap 2019 and it was normal  Type 2 DM- on metformin   Plan:   1. Contraception: Nexplanon 2. Doing well  3. Follow up as needed  4. F/u with PCP for DM management  Brett Soza I, NP 09/22/2018 11:29 AM  

## 2018-09-22 NOTE — Progress Notes (Signed)
Subjective:     Crystal Castillo is a 28 y.o. female who presents for a postpartum visit. She is 4 weeks postpartum following a SVD. I have fully reviewed the prenatal and intrapartum course. The delivery was at 39w gestational weeks. Outcome: spontaneous vaginal delivery. Anesthesia: epidural. Postpartum course has been uncomplicated. Baby's course has been uncomplicated. Baby is feeding by breast. Bleeding staining only. Bowel function is normal. Bladder function is normal. Patient is not sexually active. Contraception method is Nexplanon. Postpartum depression screening: negative.  The following portions of the patient's history were reviewed and updated as appropriate: allergies, current medications, past family history, past medical history, past social history, past surgical history and problem list.  Review of Systems Pertinent items are noted in HPI.   Objective:    BP (!) 144/81   Pulse 70   Wt 277 lb (125.6 kg)   BMI 43.38 kg/m   General:  alert and cooperative  Lungs: clear to auscultation bilaterally  Heart:  regular rate and rhythm, S1, S2 normal, no murmur, click, rub or gallop  Abdomen: soft, non-tender; bowel sounds normal; no masses,  no organomegaly        Assessment:   Normal postpartum exam. Pap smear not done at today's visit.  Last pap 2019 and it was normal  Type 2 DM- on metformin   Plan:   1. Contraception: Nexplanon 2. Doing well  3. Follow up as needed  4. F/u with PCP for DM management  Sixto Bowdish, Artist Pais, NP 09/22/2018 11:29 AM

## 2018-10-01 ENCOUNTER — Encounter: Payer: Medicaid Other | Admitting: Obstetrics and Gynecology

## 2019-06-20 ENCOUNTER — Other Ambulatory Visit: Payer: Self-pay | Admitting: Obstetrics & Gynecology

## 2021-03-25 ENCOUNTER — Ambulatory Visit (INDEPENDENT_AMBULATORY_CARE_PROVIDER_SITE_OTHER): Payer: Medicaid Other | Admitting: Registered"

## 2021-03-25 ENCOUNTER — Other Ambulatory Visit: Payer: Self-pay

## 2021-03-25 ENCOUNTER — Other Ambulatory Visit: Payer: Self-pay | Admitting: Lactation Services

## 2021-03-25 ENCOUNTER — Encounter: Payer: Medicaid Other | Attending: Family Medicine | Admitting: Registered"

## 2021-03-25 DIAGNOSIS — Z794 Long term (current) use of insulin: Secondary | ICD-10-CM | POA: Diagnosis not present

## 2021-03-25 DIAGNOSIS — O24319 Unspecified pre-existing diabetes mellitus in pregnancy, unspecified trimester: Secondary | ICD-10-CM

## 2021-03-25 DIAGNOSIS — Z3A09 9 weeks gestation of pregnancy: Secondary | ICD-10-CM | POA: Diagnosis not present

## 2021-03-25 DIAGNOSIS — O24311 Unspecified pre-existing diabetes mellitus in pregnancy, first trimester: Secondary | ICD-10-CM | POA: Diagnosis not present

## 2021-03-25 MED ORDER — INSULIN ASPART 100 UNIT/ML IJ SOLN: INTRAMUSCULAR | 8 refills | Status: DC

## 2021-03-25 MED ORDER — OMNIPOD DASH PODS (GEN 4) MISC: 1.0000 | 8 refills | Status: DC

## 2021-03-25 MED ORDER — OMNIPOD DASH INTRO (GEN 4) KIT: 1.0000 | PACK | 0 refills | Status: DC

## 2021-03-25 MED ORDER — ACCU-CHEK SOFTCLIX LANCETS MISC: 12 refills | Status: DC

## 2021-03-25 MED ORDER — ACCU-CHEK GUIDE W/DEVICE KIT: 1.0000 | PACK | Freq: Once | 0 refills | Status: AC

## 2021-03-25 MED ORDER — ACCU-CHEK GUIDE VI STRP: ORAL_STRIP | 12 refills | Status: DC

## 2021-03-25 NOTE — Progress Notes (Signed)
Ordered Omnipod, Omnipod Intro kit and blood glucose monitoring supplies per Levie [place], RD

## 2021-03-25 NOTE — Progress Notes (Signed)
Patient was seen for Type 2 Diabetes self-management on 03/25/21  Patient is New OB on 2/23  Start time 1120 and End time 1200   Estimated due date: 11/22/2017 approx  Clinical: Medications: Continue until starting Omnipod  Lantus 40 units bedtime Novolog (1:15) Avg 4-8 units meals; 2-5 units snacks Medical History: T2D managed with insulin Labs: OGTT n/a, A1c 6.2%   Dietary and Lifestyle History: Pt states she has a strong family history for T2D. Pt states she was diagnosed at age 27 and managed with metformin and diet to begin with and lost too much weight and then was treated for anorexia.   Pt states for quite a few years was able to manage with metformin but required insulin later. Pt states thought her insurance wasn't covering medication and stopped all medications. Pt states her A1c got of to 11:%. Pt states after she started back on insulin she has been able to get back under control (6.6%)  Pt states she has been advocating for early care in this pregnancy because she is aware of the potential risks due to diabetes during pregnancy.   Physical Activity: moderate walking - working 3x/week, household chores, taking care of daughter.  Stress: 5/10 Sleep: 8-10 hrs  24 hr Recall:  First Meal: bagel thin, egg, yogurt, coffee sugar free creamer Snack: sometimes crackers Second meal:  Snack:string cheese OR banana OR protein bar Third meal: pasta OR chicken or [place] sausage, green beans, potatoes, diet Dr Malachi Bonds Snack: cookie 2x/week Beverages: water, coffee, diet soda, trop 50  NUTRITION INTERVENTION  Nutrition education (E-1) on the following topics:   Reviewed dietary management of diabetes Discussed updates on Omnipod and Dexcom Taught how to use the new accu chek lancing device  Plan: Rx for Accu chek lancets and strips  Rx for Accu chek lancing device Rx for Omnipod DASH every 72 hours Rx for Omnipod DASH kit  Rx for Novolog insulin vial  Return Wed Feb 15th to  start Omnipod DASH if you receive the kit and pods before that date. Before your appointment bring Novolog insulin vial to room temperature Charge your PDM controller before the visit to start your pump  Additional future plan: Future Rx will be put in for Dexcom7 when it is released Look into getting an eye exam  Patient instructed to monitor glucose levels: FBS: 60 - ? 95 mg/dL  1 hour: ? 140 mg/dL 2 hour: ? 120 mg/dL  Patient received handouts: Pre-existing diabetes in Pregnancy Carbohydrate Counting List  Patient will be seen for follow-up as needed.

## 2021-03-27 MED ORDER — OMNIPOD DASH PODS (GEN 4) MISC: 1.0000 | 8 refills | Status: DC

## 2021-03-27 NOTE — Addendum Note (Signed)
Addended by: Isabell Jarvis on: 03/27/2021 11:34 AM   Modules accepted: Orders

## 2021-04-02 ENCOUNTER — Other Ambulatory Visit: Payer: Medicaid Other

## 2021-04-02 ENCOUNTER — Other Ambulatory Visit: Payer: Self-pay

## 2021-04-07 ENCOUNTER — Encounter: Payer: Medicaid Other | Admitting: Obstetrics and Gynecology

## 2021-04-10 ENCOUNTER — Other Ambulatory Visit: Payer: Self-pay

## 2021-04-10 ENCOUNTER — Other Ambulatory Visit (HOSPITAL_COMMUNITY)
Admission: RE | Admit: 2021-04-10 | Discharge: 2021-04-10 | Disposition: A | Discharge status: Home / Self Care | Payer: Medicaid Other | Source: Ambulatory Visit | Attending: Obstetrics and Gynecology | Admitting: Obstetrics and Gynecology

## 2021-04-10 ENCOUNTER — Encounter: Payer: Self-pay | Admitting: Obstetrics and Gynecology

## 2021-04-10 ENCOUNTER — Ambulatory Visit (INDEPENDENT_AMBULATORY_CARE_PROVIDER_SITE_OTHER): Payer: Medicaid Other | Admitting: Obstetrics and Gynecology

## 2021-04-10 VITALS — BP 116/74 | HR 93 | Wt 292.8 lb

## 2021-04-10 DIAGNOSIS — O0990 Supervision of high risk pregnancy, unspecified, unspecified trimester: Secondary | ICD-10-CM | POA: Diagnosis present

## 2021-04-10 DIAGNOSIS — Z23 Encounter for immunization: Secondary | ICD-10-CM | POA: Diagnosis not present

## 2021-04-10 DIAGNOSIS — O24319 Unspecified pre-existing diabetes mellitus in pregnancy, unspecified trimester: Secondary | ICD-10-CM

## 2021-04-10 DIAGNOSIS — Z794 Long term (current) use of insulin: Secondary | ICD-10-CM | POA: Diagnosis not present

## 2021-04-10 MED ORDER — ASPIRIN EC 81 MG PO TBEC: 81.0000 mg | DELAYED_RELEASE_TABLET | Freq: Every day | ORAL | 6 refills | Status: DC

## 2021-04-10 MED ORDER — BLOOD PRESSURE MONITORING DEVI: 1.0000 | 0 refills | Status: DC

## 2021-04-10 NOTE — Addendum Note (Signed)
Addended by: Henrietta Dine on: 04/10/2021 04:18 PM   Modules accepted: Orders

## 2021-04-10 NOTE — Progress Notes (Signed)
Subjective:  Crystal Castillo is a 31 y.o. G2P1001 at [redacted]w[redacted]d being seen today for her first OB visit. EDD by LMP. H/O TSVD complicated by Type 2 DM.    She is currently monitored for the following issues for this high-risk pregnancy and has Major depressive disorder, recurrent severe without psychotic features ([place]); Tobacco use disorder; Borderline personality disorder ([place]); Maternal morbid obesity, antepartum ([place]); Pre-existing insulin treated diabetes mellitus during pregnancy ([place]); Drug abuse ([place]); Type 2 diabetes mellitus without complication ([place]); and Supervision of high risk pregnancy, antepartum on their problem list.  Patient reports no complaints.  Contractions: Not present. Vag. Bleeding: None.  Movement: Absent. Denies leaking of fluid.   The following portions of the patient's history were reviewed and updated as appropriate: allergies, current medications, past family history, past medical history, past social history, past surgical history and problem list. Problem list updated.  Objective:   Vitals:   04/10/21 1443  BP: 116/74  Pulse: 93  Weight: 292 lb 12.8 oz (132.8 kg)    Fetal Status: Fetal Heart Rate (bpm): 174   Movement: Absent     General:  Alert, oriented and cooperative. Patient is in no acute distress.  Skin: Skin is warm and dry. No rash noted.   Cardiovascular: Normal heart rate noted  Respiratory: Normal respiratory effort, no problems with respiration noted  Abdomen: Soft, gravid, appropriate for gestational age. Pain/Pressure: Present     Pelvic:  Cervical exam performed        Extremities: Normal range of motion.  Edema: None  Mental Status: Normal mood and affect. Normal behavior. Normal judgment and thought content.   Urinalysis:      Assessment and Plan:  Pregnancy: G2P1001 at [redacted]w[redacted]d  1. Supervision of high risk pregnancy, antepartum Prenatal care and labs reviewed with pt Genetic testing discussed    2. Pre-existing insulin treated diabetes  mellitus during pregnancy (Crystal Castillo) To see Crystal Castillo next week for [employer] BASA qd DM and pregnancy reviewed, reduction of risks with good glycemic control Serial growth scans and antenatal testing as per protocol IOL as per guidelines  Preterm labor symptoms and general obstetric precautions including but not limited to vaginal bleeding, contractions, leaking of fluid and fetal movement were reviewed in detail with the patient. Please refer to After Visit Summary for other counseling recommendations.  Return in about 4 weeks (around 05/08/2021) for OB visit, face to face, MD only.   Chancy Milroy, MD

## 2021-04-10 NOTE — Patient Instructions (Signed)
First Trimester of Pregnancy °The first trimester of pregnancy starts on the first day of your last menstrual period until the end of week 12. This is months 1 through 3 of pregnancy. A week after a sperm fertilizes an egg, the egg will implant into the wall of the uterus and begin to develop into a baby. By the end of 12 weeks, all the baby's organs will be formed and the baby will be 2-3 inches in size. °Body changes during your first trimester °Your body goes through many changes during pregnancy. The changes vary and generally return to normal after your baby is born. °Physical changes °You may gain or lose weight. °Your breasts may begin to grow larger and become tender. The tissue that surrounds your nipples (areola) may become darker. °Dark spots or blotches (chloasma or mask of pregnancy) may develop on your face. °You may have changes in your hair. These can include thickening or thinning of your hair or changes in texture. °Health changes °You may feel nauseous, and you may vomit. °You may have heartburn. °You may develop headaches. °You may develop constipation. °Your gums may bleed and may be sensitive to brushing and flossing. °Other changes °You may tire easily. °You may urinate more often. °Your menstrual periods will stop. °You may have a loss of appetite. °You may develop cravings for certain kinds of food. °You may have changes in your emotions from day to day. °You may have more vivid and strange dreams. °Follow these instructions at home: °Medicines °Follow your health care provider's instructions regarding medicine use. Specific medicines may be either safe or unsafe to take during pregnancy. Do not take any medicines unless told to by your health care provider. °Take a prenatal vitamin that contains at least 600 micrograms (mcg) of folic acid. °Eating and drinking °Eat a healthy diet that includes fresh fruits and vegetables, whole grains, good sources of protein such as meat, eggs, or tofu,  and low-fat dairy products. °Avoid raw meat and unpasteurized juice, milk, and cheese. These carry germs that can harm you and your baby. °If you feel nauseous or you vomit: °Eat 4 or 5 small meals a day instead of 3 large meals. °Try eating a few soda crackers. °Drink liquids between meals instead of during meals. °You may need to take these actions to prevent or treat constipation: °Drink enough fluid to keep your urine pale yellow. °Eat foods that are high in fiber, such as beans, whole grains, and fresh fruits and vegetables. °Limit foods that are high in fat and processed sugars, such as fried or sweet foods. °Activity °Exercise only as directed by your health care provider. Most people can continue their usual exercise routine during pregnancy. Try to exercise for 30 minutes at least 5 days a week. °Stop exercising if you develop pain or cramping in the lower abdomen or lower back. °Avoid exercising if it is very hot or humid or if you are at high altitude. °Avoid heavy lifting. °If you choose to, you may have sex unless your health care provider tells you not to. °Relieving pain and discomfort °Wear a good support bra to relieve breast tenderness. °Rest with your legs elevated if you have leg cramps or low back pain. °If you develop bulging veins (varicose veins) in your legs: °Wear support hose as told by your health care provider. °Elevate your feet for 15 minutes, 3-4 times a day. °Limit salt in your diet. °Safety °Wear your seat belt at all times when driving   or riding in a car. °Talk with your health care provider if someone is verbally or physically abusive to you. °Talk with your health care provider if you are feeling sad or have thoughts of hurting yourself. °Lifestyle °Do not use hot tubs, steam rooms, or saunas. °Do not douche. Do not use tampons or scented sanitary pads. °Do not use herbal remedies, alcohol, illegal drugs, or medicines that are not approved by your health care provider. Chemicals  in these products can harm your baby. °Do not use any products that contain nicotine or tobacco, such as cigarettes, e-cigarettes, and chewing tobacco. If you need help quitting, ask your health care provider. °Avoid cat litter boxes and soil used by cats. These carry germs that can cause birth defects in the baby and possibly loss of the unborn baby (fetus) by miscarriage or stillbirth. °General instructions °During routine prenatal visits in the first trimester, your health care provider will do a physical exam, perform necessary tests, and ask you how things are going. Keep all follow-up visits. This is important. °Ask for help if you have counseling or nutritional needs during pregnancy. Your health care provider can offer advice or refer you to specialists for help with various needs. °Schedule a dentist appointment. At home, brush your teeth with a soft toothbrush. Floss gently. °Write down your questions. Take them to your prenatal visits. °Where to find more information °American Pregnancy Association: americanpregnancy.org °American College of Obstetricians and Gynecologists: acog.org/en/Womens%20Health/Pregnancy °Office on Women's Health: womenshealth.gov/pregnancy °Contact a health care provider if you have: °Dizziness. °A fever. °Mild pelvic cramps, pelvic pressure, or nagging pain in the abdominal area. °Nausea, vomiting, or diarrhea that lasts for 24 hours or longer. °A bad-smelling vaginal discharge. °Pain when you urinate. °Known exposure to a contagious illness, such as chickenpox, measles, Zika virus, HIV, or hepatitis. °Get help right away if you have: °Spotting or bleeding from your vagina. °Severe abdominal cramping or pain. °Shortness of breath or chest pain. °Any kind of trauma, such as from a fall or a car crash. °New or increased pain, swelling, or redness in an arm or leg. °Summary °The first trimester of pregnancy starts on the first day of your last menstrual period until the end of week  12 (months 1 through 3). °Eating 4 or 5 small meals a day rather than 3 large meals may help to relieve nausea and vomiting. °Do not use any products that contain nicotine or tobacco, such as cigarettes, e-cigarettes, and chewing tobacco. If you need help quitting, ask your health care provider. °Keep all follow-up visits. This is important. °This information is not intended to replace advice given to you by your health care provider. Make sure you discuss any questions you have with your health care provider. °Document Revised: 07/12/2019 Document Reviewed: 05/18/2019 °Elsevier Patient Education © 2022 [employer] Inc. ° °

## 2021-04-10 NOTE — Addendum Note (Signed)
Addended by: Henrietta Dine on: 04/10/2021 05:38 PM   Modules accepted: Orders

## 2021-04-11 ENCOUNTER — Encounter: Payer: Medicaid Other | Admitting: Registered"

## 2021-04-11 ENCOUNTER — Other Ambulatory Visit: Payer: Self-pay

## 2021-04-11 ENCOUNTER — Encounter: Payer: Self-pay | Admitting: Obstetrics and Gynecology

## 2021-04-11 DIAGNOSIS — O24311 Unspecified pre-existing diabetes mellitus in pregnancy, first trimester: Secondary | ICD-10-CM | POA: Diagnosis not present

## 2021-04-11 DIAGNOSIS — R7989 Other specified abnormal findings of blood chemistry: Secondary | ICD-10-CM | POA: Insufficient documentation

## 2021-04-11 DIAGNOSIS — Z794 Long term (current) use of insulin: Secondary | ICD-10-CM

## 2021-04-11 DIAGNOSIS — O24319 Unspecified pre-existing diabetes mellitus in pregnancy, unspecified trimester: Secondary | ICD-10-CM

## 2021-04-11 LAB — PROTEIN / CREATININE RATIO, URINE
Creatinine, Urine: 175.1 mg/dL
Protein, Ur: 21.7 mg/dL
Protein/Creat Ratio: 124 mg/g creat (ref 0–200)

## 2021-04-11 LAB — COMPREHENSIVE METABOLIC PANEL
ALT: 39 IU/L — ABNORMAL HIGH (ref 0–32)
AST: 21 IU/L (ref 0–40)
Albumin/Globulin Ratio: 1.8 (ref 1.2–2.2)
Albumin: 4.2 g/dL (ref 3.9–5.0)
Alkaline Phosphatase: 46 IU/L (ref 44–121)
BUN/Creatinine Ratio: 18 (ref 9–23)
BUN: 9 mg/dL (ref 6–20)
Bilirubin Total: 0.2 mg/dL (ref 0.0–1.2)
CO2: 20 mmol/L (ref 20–29)
Calcium: 9.1 mg/dL (ref 8.7–10.2)
Chloride: 104 mmol/L (ref 96–106)
Creatinine, Ser: 0.51 mg/dL — ABNORMAL LOW (ref 0.57–1.00)
Globulin, Total: 2.3 g/dL (ref 1.5–4.5)
Glucose: 80 mg/dL (ref 70–99)
Potassium: 4.1 mmol/L (ref 3.5–5.2)
Sodium: 140 mmol/L (ref 134–144)
Total Protein: 6.5 g/dL (ref 6.0–8.5)
eGFR: 130 mL/min/{1.73_m2} (ref >59)

## 2021-04-11 LAB — HEMOGLOBIN A1C
Est. average glucose Bld gHb Est-mCnc: 126 mg/dL
Hgb A1c MFr Bld: 6 % — ABNORMAL HIGH (ref 4.8–5.6)

## 2021-04-11 LAB — TSH: TSH: 0.25 u[IU]/mL — ABNORMAL LOW (ref 0.450–4.500)

## 2021-04-12 ENCOUNTER — Encounter: Payer: Self-pay | Admitting: Obstetrics and Gynecology

## 2021-04-12 ENCOUNTER — Encounter: Payer: Self-pay | Admitting: Registered"

## 2021-04-12 LAB — CBC/D/PLT+RPR+RH+ABO+RUBIGG...
Antibody Screen: NEGATIVE
Basophils Absolute: 0.1 10*3/uL (ref 0.0–0.2)
Basos: 1 %
EOS (ABSOLUTE): 0.1 10*3/uL (ref 0.0–0.4)
Eos: 1 %
HCV Ab: REACTIVE — AB
HIV Screen 4th Generation wRfx: NONREACTIVE
Hematocrit: 42 % (ref 34.0–46.6)
Hemoglobin: 14.1 g/dL (ref 11.1–15.9)
Hepatitis B Surface Ag: NEGATIVE
Immature Grans (Abs): 0 10*3/uL (ref 0.0–0.1)
Immature Granulocytes: 0 %
Lymphocytes Absolute: 3.3 10*3/uL — ABNORMAL HIGH (ref 0.7–3.1)
Lymphs: 30 %
MCH: 31 pg (ref 26.6–33.0)
MCHC: 33.6 g/dL (ref 31.5–35.7)
MCV: 92 fL (ref 79–97)
Monocytes Absolute: 0.7 10*3/uL (ref 0.1–0.9)
Monocytes: 6 %
Neutrophils Absolute: 6.8 10*3/uL (ref 1.4–7.0)
Neutrophils: 62 %
Platelets: 186 10*3/uL (ref 150–450)
RBC: 4.55 x10E6/uL (ref 3.77–5.28)
RDW: 13 % (ref 11.7–15.4)
RPR Ser Ql: NONREACTIVE
Rh Factor: POSITIVE
Rubella Antibodies, IGG: 2.37 index (ref >0.99)
WBC: 11 10*3/uL — ABNORMAL HIGH (ref 3.4–10.8)

## 2021-04-12 LAB — URINE CULTURE, OB REFLEX

## 2021-04-12 LAB — HCV RT-PCR, QUANT (NON-GRAPH)
HCV log10: 6.312 log10 IU/mL
Hepatitis C Quantitation: 2050000 IU/mL

## 2021-04-12 LAB — CULTURE, OB URINE

## 2021-04-12 NOTE — Progress Notes (Signed)
Patient was seen on 04/11/21 for follow-up assessment and education for T2D in pregnancy.  EDD 11/10/21; [redacted]w[redacted]d  Omnipod pump settings: Basal: 12a-12a 1.15 u/hr Bolus: 3-7 u based on meal size, 1-3 units snacks  FBS: 74-115 mg/dL avg 85 mg/dL PPBG 85-027, avg 741-287 mg/dL    Pt states she is starting to feel better and cooking more at home.   Pt states she feels she is making good choices 40% of the time. Pt states she has been working on her relationship with food and having the extra motivation of taking care of pregnancy helps her stay on track. Pt reports that has a lot of family responsibilities and has to rely on convenience foods. Pt also states that she is a stress/boredom eater.  Pt states she has good support from her husband.  Pt states she has health concerns that makes her anxious including high cholesterol and family history of diabetes with serious complications.  Pt states she eats vegetables daily, green vegetable with dinner. Pt states she eats a large breakfast and often not hungry at lunch, usually only a snack until dinner.  B eggs, English muffin or bagel thin OR lowfat yogurt with berries, granola S: PB crackers, and/or banana OR protein shake or pro bar L: none D: chicken tacos, low carb tortilla, green vegetable  The following learning objectives reviewed during follow-up visit:  Balanced meals and bolus insulin for meals Different apps to help track and manage blood sugar/insulin Patient also started Omnipod during visit.   Plan:  Consider having a structured lunch Use temporary basal setting for 12 hours and then switch to 24 hr setting. Continue to check blood sugar FBS and 2 hr PP Set up Dexcom account and let [facility] know what you have received CGM   Patient instructed to monitor glucose levels: FBS: 70 - 95 mg/dl 2 hour: <867 mg/dl  Patient received the following handouts: Omnipod start checklist  Patient will be seen for follow-up in 1 weeks  or as needed.

## 2021-04-14 ENCOUNTER — Encounter: Payer: Self-pay | Admitting: Obstetrics and Gynecology

## 2021-04-14 ENCOUNTER — Other Ambulatory Visit: Payer: Self-pay

## 2021-04-14 DIAGNOSIS — O0990 Supervision of high risk pregnancy, unspecified, unspecified trimester: Secondary | ICD-10-CM

## 2021-04-14 DIAGNOSIS — B1920 Unspecified viral hepatitis C without hepatic coma: Secondary | ICD-10-CM

## 2021-04-14 LAB — CYTOLOGY - PAP
Adequacy: ABSENT
Chlamydia: NEGATIVE
Comment: NEGATIVE
Comment: NORMAL
Diagnosis: NEGATIVE
Neisseria Gonorrhea: NEGATIVE

## 2021-04-14 NOTE — Progress Notes (Signed)
ID orders placed per Dr Alysia Penna.  Crystal Castillo

## 2021-04-15 ENCOUNTER — Other Ambulatory Visit: Payer: Self-pay | Admitting: Lactation Services

## 2021-04-15 MED ORDER — DEXCOM G7 SENSOR MISC
1.0000 | 8 refills | Status: DC
Start: 2021-04-15 — End: 2021-04-23

## 2021-04-15 NOTE — Progress Notes (Signed)
Ordered Dexcom G7 per Heywood Bene, RD

## 2021-04-16 ENCOUNTER — Other Ambulatory Visit: Payer: Medicaid Other

## 2021-04-16 LAB — T4: T4, Total: 10.5 ug/dL (ref 4.5–12.0)

## 2021-04-16 LAB — T4, FREE: Free T4: 1.31 ng/dL (ref 0.82–1.77)

## 2021-04-16 LAB — SPECIMEN STATUS REPORT

## 2021-04-21 ENCOUNTER — Encounter: Payer: Medicaid Other | Admitting: Infectious Disease

## 2021-04-23 ENCOUNTER — Other Ambulatory Visit: Payer: Self-pay | Admitting: Lactation Services

## 2021-04-23 MED ORDER — DEXCOM G6 TRANSMITTER MISC: 1.0000 | 2 refills | Status: AC

## 2021-04-23 MED ORDER — DEXCOM G6 SENSOR MISC: 1.0000 | 6 refills | Status: AC

## 2021-04-23 NOTE — Progress Notes (Signed)
Ordered Dexcom G6 as G7 not available with patients insurance.  ?

## 2021-04-24 ENCOUNTER — Encounter: Payer: Self-pay | Admitting: Infectious Disease

## 2021-04-24 ENCOUNTER — Ambulatory Visit (INDEPENDENT_AMBULATORY_CARE_PROVIDER_SITE_OTHER): Payer: Medicaid Other | Admitting: Infectious Disease

## 2021-04-24 ENCOUNTER — Other Ambulatory Visit: Payer: Self-pay

## 2021-04-24 VITALS — BP 122/79 | HR 91 | Temp 98.8°F | Wt 291.0 lb

## 2021-04-24 DIAGNOSIS — O98819 Other maternal infectious and parasitic diseases complicating pregnancy, unspecified trimester: Secondary | ICD-10-CM

## 2021-04-24 DIAGNOSIS — O0990 Supervision of high risk pregnancy, unspecified, unspecified trimester: Secondary | ICD-10-CM

## 2021-04-24 DIAGNOSIS — O24119 Pre-existing diabetes mellitus, type 2, in pregnancy, unspecified trimester: Secondary | ICD-10-CM

## 2021-04-24 DIAGNOSIS — E119 Type 2 diabetes mellitus without complications: Secondary | ICD-10-CM

## 2021-04-24 DIAGNOSIS — O99320 Drug use complicating pregnancy, unspecified trimester: Secondary | ICD-10-CM

## 2021-04-24 DIAGNOSIS — F1910 Other psychoactive substance abuse, uncomplicated: Secondary | ICD-10-CM

## 2021-04-24 DIAGNOSIS — O99210 Obesity complicating pregnancy, unspecified trimester: Secondary | ICD-10-CM

## 2021-04-24 DIAGNOSIS — B182 Chronic viral hepatitis C: Secondary | ICD-10-CM

## 2021-04-24 DIAGNOSIS — Z3A00 Weeks of gestation of pregnancy not specified: Secondary | ICD-10-CM

## 2021-04-24 DIAGNOSIS — F1990 Other psychoactive substance use, unspecified, uncomplicated: Secondary | ICD-10-CM

## 2021-04-24 NOTE — Progress Notes (Signed)
Subjective:  Reason for infectious disease consult chronic hepatitis C without hepatic coma in patient who is pregnant  Requesting Physician: Arlina Robes, MD   Patient ID: Crystal Castillo, female    DOB: [birth year], 29 y.o.   MRN: [id number]  HPI   Crystal Castillo is a 31 year old Caucasian woman with a past medical history significant for diabetes mellitus obesity PTSD and prior substance abuse including IV drugs and heavy alcohol use who has completely stopped using any intravenous drugs ever since she met her current husband.  She also has drank nearly no alcohol for years.  She has had young child who accompanies her to her visit and is currently pregnant.  She was found to be hepatitis C positive with a viral load of 2,050,000 copies.  Genotype is not known.  She does not have hepatitis B coinfection and she is HIV negative.    Past Medical History:  Diagnosis Date   Anorexia nervosa with bulimia    just finished treatment in August 2012 for this   Anxiety    Bartholin's cyst    Depression    Diabetes mellitus    not currently treated for this. Pt states she was told she had DM, but lost @  150 lbs in 9 months.   Diabetes mellitus without complication ([name])    Obesity    PTSD (post-traumatic stress disorder)     Past Surgical History:  Procedure Laterality Date   TONSILLECTOMY     WISDOM TOOTH EXTRACTION      Family History  Problem Relation Age of Onset   Alcohol abuse Father    Hypertension Father    COPD Father    Diabetes Mother    Asthma Mother       Social History   Socioeconomic History   Marital status: Married    Spouse name: Not on file   Number of children: Not on file   Years of education: Not on file   Highest education level: Not on file  Occupational History   Not on file  Tobacco Use   Smoking status: Former    Packs/day: 0.50    Types: Cigarettes   Smokeless tobacco: Never   Tobacco comments:    rarely smokes  Vaping Use   Vaping Use:  Former  Substance and Sexual Activity   Alcohol use: Not Currently    Comment: occasional drinker - 2 or 3 times a week   Drug use: No   Sexual activity: Yes    Birth control/protection: None  Other Topics Concern   Not on file  Social History Narrative   ** Merged History Encounter **       Social Determinants of Health   Financial Resource Strain: Not on file  Food Insecurity: Not on file  Transportation Needs: Not on file  Physical Activity: Not on file  Stress: Not on file  Social Connections: Not on file    Allergies  Allergen Reactions   Quetiapine Itching   Sulfa Antibiotics Nausea Only   Sulfa Antibiotics Nausea Only     Current Outpatient Medications:    Accu-Chek Softclix Lancets lancets, To check blood sugars 4 times a day. Fasting and 2 hours after the first bite of breakfast, lunch and dinner., Disp: 100 each, Rfl: 12   acetaminophen (TYLENOL) 325 MG tablet, Take 650 mg by mouth every 6 (six) hours as needed., Disp: , Rfl:    aspirin EC 81 MG tablet, Take 1 tablet (81 mg  total) by mouth daily., Disp: 90 tablet, Rfl: 6   Blood Pressure Monitoring DEVI, 1 each by Does not apply route once a week., Disp: 1 each, Rfl: 0   Continuous Blood Gluc Sensor (DEXCOM G6 SENSOR) MISC, 1 Device by Does not apply route as directed for 10 days., Disp: 1 each, Rfl: 6   Continuous Blood Gluc Transmit (DEXCOM G6 TRANSMITTER) MISC, 1 Device by Does not apply route every 3 (three) months., Disp: 1 each, Rfl: 2   glucose blood (ACCU-CHEK GUIDE) test strip, To check blood sugars 4 times a day. Fasting and 2 hours after the first bite of breakfast, lunch and dinner., Disp: 100 each, Rfl: 12   insulin aspart (NOVOLOG) 100 UNIT/ML injection, For use in Omnipod. Current daily dosage is 54 units per day. To be adjusted by Physician as needed, Disp: 20 mL, Rfl: 8   Insulin Disposable Pump (OMNIPOD DASH INTRO, GEN 4,) KIT, 1 kit by Does not apply route every 3 (three) days., Disp: 1 kit, Rfl:  0   Insulin Disposable Pump (OMNIPOD DASH PODS, GEN 4,) MISC, 1 Device by Does not apply route every 3 (three) days., Disp: 3 each, Rfl: 8   Prenatal Vit-Fe Fumarate-FA (PREPLUS) 27-1 MG TABS, Take 1 tablet by mouth daily., Disp: 30 tablet, Rfl: 6   insulin lispro (HUMALOG) 100 UNIT/ML injection, Inject 0.5 mLs (50 Units total) into the skin daily. Via pump and to cover meals/snacks as directed, Disp: 20 mL, Rfl: 11    Review of Systems  Constitutional:  Negative for activity change, appetite change, chills, diaphoresis, fatigue, fever and unexpected weight change.  HENT:  Negative for congestion, rhinorrhea, sinus pressure, sneezing, sore throat and trouble swallowing.   Eyes:  Negative for photophobia and visual disturbance.  Respiratory:  Negative for cough, chest tightness, shortness of breath, wheezing and stridor.   Cardiovascular:  Negative for chest pain, palpitations and leg swelling.  Gastrointestinal:  Negative for abdominal distention, abdominal pain, anal bleeding, blood in stool, constipation, diarrhea, nausea and vomiting.  Genitourinary:  Negative for difficulty urinating, dysuria, flank pain and hematuria.  Musculoskeletal:  Negative for arthralgias, back pain, gait problem, joint swelling and myalgias.  Skin:  Negative for color change, pallor, rash and wound.  Neurological:  Negative for dizziness, tremors, weakness and light-headedness.  Hematological:  Negative for adenopathy. Does not bruise/bleed easily.  Psychiatric/Behavioral:  Negative for agitation, behavioral problems, confusion, decreased concentration, dysphoric mood and sleep disturbance.       Objective:   Physical Exam Constitutional:      General: She is not in acute distress.    Appearance: Normal appearance. She is well-developed. She is obese. She is not ill-appearing, toxic-appearing or diaphoretic.  HENT:     Head: Normocephalic and atraumatic.     Right Ear: Hearing and external ear normal.      Left Ear: Hearing and external ear normal.     Nose: Nose normal. No nasal deformity or rhinorrhea.  Eyes:     General: No scleral icterus.    Conjunctiva/sclera: Conjunctivae normal.     Right eye: Right conjunctiva is not injected.     Left eye: Left conjunctiva is not injected.     Pupils: Pupils are equal, round, and reactive to light.  Neck:     Vascular: No JVD.  Cardiovascular:     Rate and Rhythm: Normal rate and regular rhythm.     Heart sounds: Normal heart sounds, S1 normal and S2 normal. No murmur heard.  No friction rub.  Abdominal:     General: Bowel sounds are normal. There is no distension.     Palpations: Abdomen is soft.     Tenderness: There is no abdominal tenderness.  Musculoskeletal:        General: Normal range of motion.     Right shoulder: Normal.     Left shoulder: Normal.     Cervical back: Normal range of motion and neck supple.     Right hip: Normal.     Left hip: Normal.     Right knee: Normal.     Left knee: Normal.  Lymphadenopathy:     Head:     Right side of head: No submandibular, preauricular or posterior auricular adenopathy.     Left side of head: No submandibular, preauricular or posterior auricular adenopathy.     Cervical: No cervical adenopathy.     Right cervical: No superficial or deep cervical adenopathy.    Left cervical: No superficial or deep cervical adenopathy.  Skin:    General: Skin is warm and dry.     Coloration: Skin is not pale.     Findings: No abrasion, bruising, ecchymosis, erythema, lesion or rash.     Nails: There is no clubbing.  Neurological:     General: No focal deficit present.     Mental Status: She is alert and oriented to person, place, and time.     Sensory: No sensory deficit.     Coordination: Coordination normal.     Gait: Gait normal.  Psychiatric:        Attention and Perception: She is attentive.        Mood and Affect: Mood normal.        Speech: Speech normal.        Behavior: Behavior  normal. Behavior is cooperative.        Thought Content: Thought content normal.        Judgment: Judgment normal.          Assessment & Plan:  Chronic hepatitis C without hepatic coma:  Current direct acting antivirals have not been systematically studied in pregnant women.  The following is from the [place] treatment guidelines:   "There are no large-scale clinical trials evaluating the safety of direct-acting antivirals (DAAs) in pregnancy. A small study evaluating the pharmacokinetics of ledipasvir/sofosbuvir in pregnancy demonstrated 100% SVR12 and no safety concerns. Similarly, an international case series of 66 pregnant women treated with ledipasvir/sofosbuvir reported 100% SVR12 and no early safety concerns in the women or their infants (Yattoo, 2018); (Chappell, 2020). Currently, there are no available data on the use of pangenotypic regimens during pregnancy.   Despite the lack of a recommendation, treatment can be considered during pregnancy on an individual basis after a patient-physician discussion about the potential risks and benefits."   I have discussed this case with our infectious disease pharmacy team and the patient and given that there is not a firm guideline or clear cut recommendation with regards to whether or not hepatitis C should be treated in pregnancy and the only data we know about has come from use of Harvoni which might not be active against her particular hepatitis C strain (genotype) we are not yet pursuing therapy in this patient  She also would like to think about it further and will discuss with her husband who will come in and get tested in our clinic to see if he potentially contracted hepatitis C from her sexually.  She is  also having her other child evaluated that child was negative for hepatitis C infection.  Ordering a FibroSure hepatitis C genotype and elastography.  She is fortunate not drinking more so that hepatotoxin has been removed she  would be at risk for fatty fatty  liver disease as well but hopefully she is not going to develop that.  She is certainly eager for the  HCV to be treated but again given uncertainty and lack of robust data during pregnancy or firm guidelines she wishes to think further about this.  I will do some further investigating with though leaders in our region.  DM2 : She is currently on insulin for this.  Obesity: Difficult and challenging now with insulin requiring diabetes mellitus.  I spent 83 minutes with the patient including than 50% of the time in face to face counseling of the patient guarding the nature of hepatitis C and hepatitis C specifically in the context of pregnancy, along with review of medical records in preparation for the visit and during the visit and in coordination of her care with ID pharmacy.

## 2021-04-29 ENCOUNTER — Telehealth: Payer: Self-pay

## 2021-04-29 DIAGNOSIS — O285 Abnormal chromosomal and genetic finding on antenatal screening of mother: Secondary | ICD-10-CM

## 2021-04-30 ENCOUNTER — Ambulatory Visit (HOSPITAL_BASED_OUTPATIENT_CLINIC_OR_DEPARTMENT_OTHER): Payer: Medicaid Other | Admitting: Obstetrics

## 2021-04-30 ENCOUNTER — Ambulatory Visit: Payer: Medicaid Other | Admitting: *Deleted

## 2021-04-30 ENCOUNTER — Other Ambulatory Visit: Payer: Self-pay

## 2021-04-30 ENCOUNTER — Other Ambulatory Visit: Payer: Self-pay | Admitting: Obstetrics and Gynecology

## 2021-04-30 ENCOUNTER — Ambulatory Visit: Payer: Medicaid Other | Attending: Obstetrics and Gynecology

## 2021-04-30 VITALS — BP 123/63 | HR 100

## 2021-04-30 DIAGNOSIS — O285 Abnormal chromosomal and genetic finding on antenatal screening of mother: Secondary | ICD-10-CM | POA: Diagnosis not present

## 2021-04-30 DIAGNOSIS — O30041 Twin pregnancy, dichorionic/diamniotic, first trimester: Secondary | ICD-10-CM | POA: Diagnosis not present

## 2021-04-30 DIAGNOSIS — O98419 Viral hepatitis complicating pregnancy, unspecified trimester: Secondary | ICD-10-CM

## 2021-04-30 DIAGNOSIS — B182 Chronic viral hepatitis C: Secondary | ICD-10-CM

## 2021-04-30 DIAGNOSIS — Z363 Encounter for antenatal screening for malformations: Secondary | ICD-10-CM

## 2021-04-30 DIAGNOSIS — O99211 Obesity complicating pregnancy, first trimester: Secondary | ICD-10-CM

## 2021-04-30 DIAGNOSIS — Z3A12 12 weeks gestation of pregnancy: Secondary | ICD-10-CM

## 2021-04-30 DIAGNOSIS — O24111 Pre-existing diabetes mellitus, type 2, in pregnancy, first trimester: Secondary | ICD-10-CM

## 2021-04-30 DIAGNOSIS — O98411 Viral hepatitis complicating pregnancy, first trimester: Secondary | ICD-10-CM | POA: Diagnosis present

## 2021-04-30 DIAGNOSIS — B189 Chronic viral hepatitis, unspecified: Secondary | ICD-10-CM | POA: Insufficient documentation

## 2021-04-30 DIAGNOSIS — E669 Obesity, unspecified: Secondary | ICD-10-CM

## 2021-04-30 NOTE — Telephone Encounter (Signed)
Called pt to given results of genetic screening. Results showed possible multifetal gestation or chromosomal abnormality. Reviewed with Crystal Living MD who recommends NT [place] and genetic counseling. Reviewed with patient. Called MFM to schedule. May not have availability for nuchal translucency but will schedule with genetic counselor. May need imaging first. They will call patient to arrange. ?

## 2021-05-01 ENCOUNTER — Ambulatory Visit (HOSPITAL_COMMUNITY): Payer: Medicaid Other

## 2021-05-01 ENCOUNTER — Other Ambulatory Visit: Payer: Self-pay

## 2021-05-01 ENCOUNTER — Other Ambulatory Visit: Payer: Self-pay | Admitting: *Deleted

## 2021-05-01 ENCOUNTER — Encounter: Payer: Self-pay | Admitting: Obstetrics and Gynecology

## 2021-05-01 DIAGNOSIS — O285 Abnormal chromosomal and genetic finding on antenatal screening of mother: Secondary | ICD-10-CM | POA: Insufficient documentation

## 2021-05-01 DIAGNOSIS — O30049 Twin pregnancy, dichorionic/diamniotic, unspecified trimester: Secondary | ICD-10-CM | POA: Insufficient documentation

## 2021-05-01 IMAGING — US US MFM OB FOLLOW UP
1 series · 14 of 28 positions shown · non-contrast
Comparison: none

[Series 1: us mfm ob follow up · 32 acquisitions, 14 frames shown]
[im 2/32]
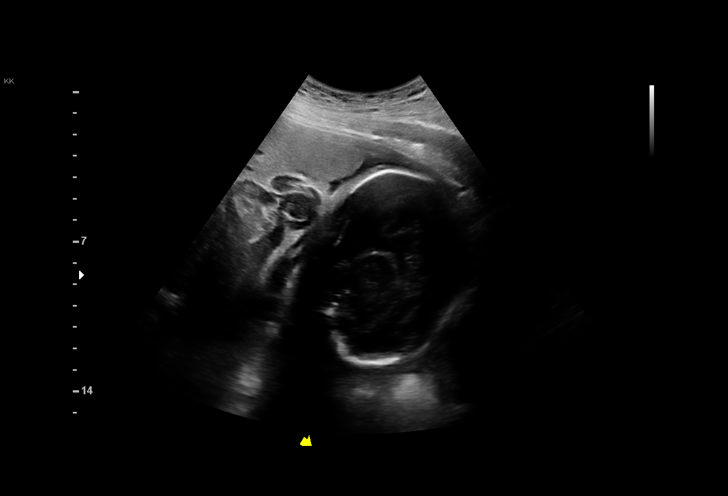
[im 4/32]
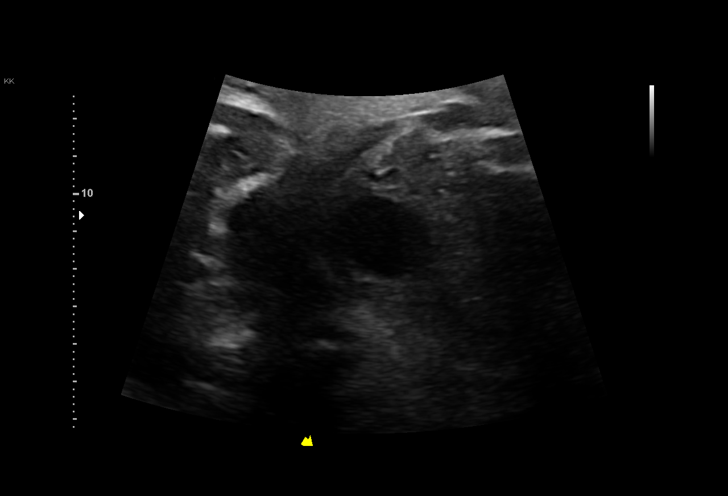
[im 6/32]
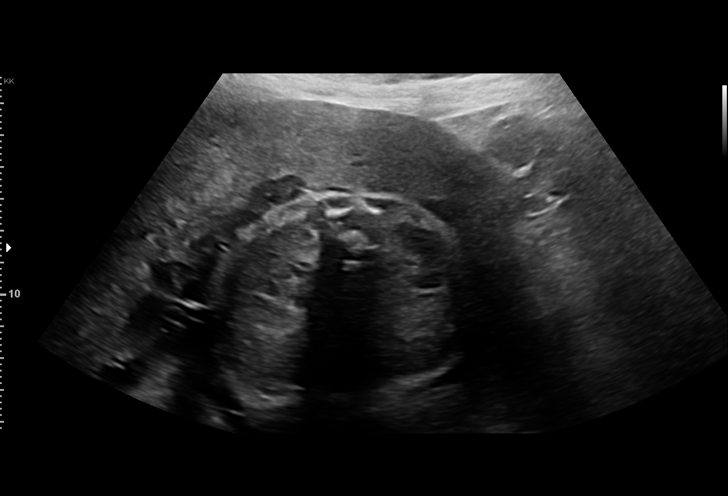
[im 9/32]
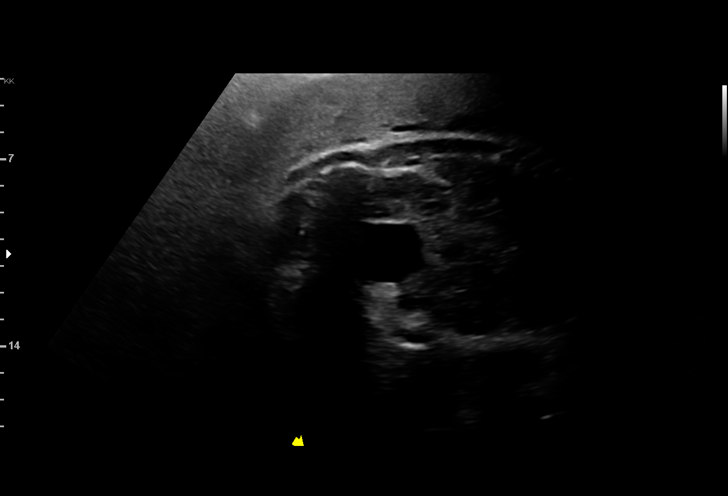
[im 11/32]
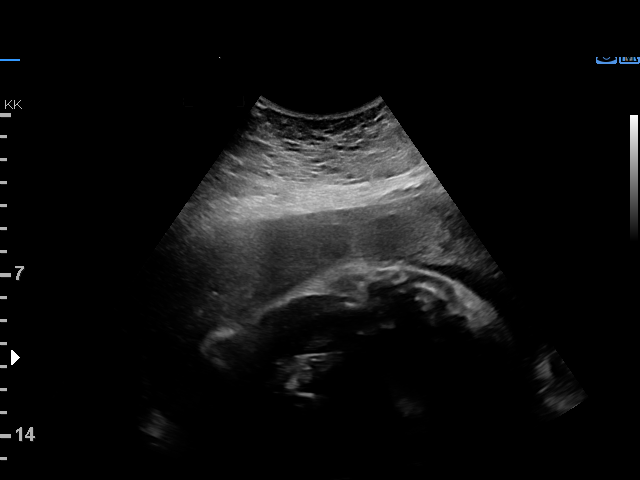
[im 13/32]
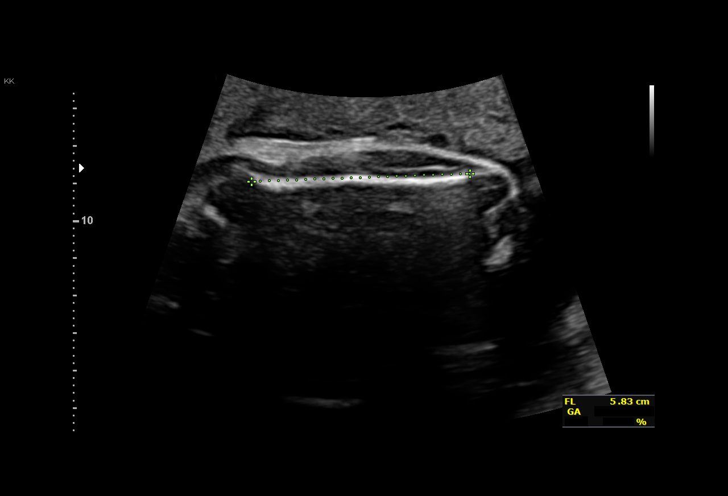
[im 15/32]
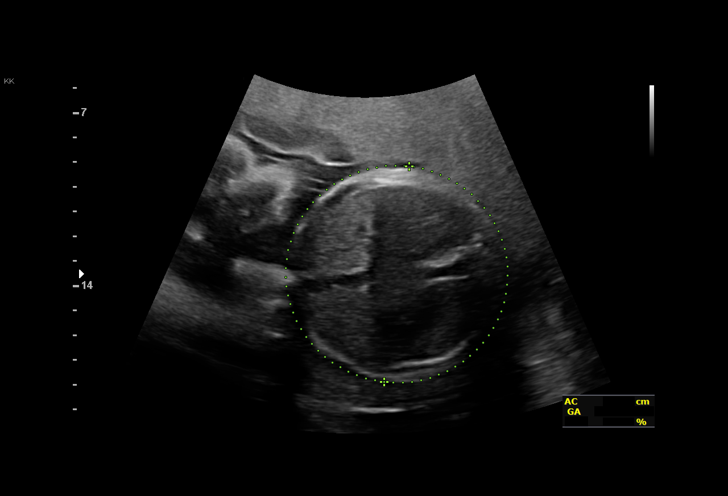
[im 18/32]
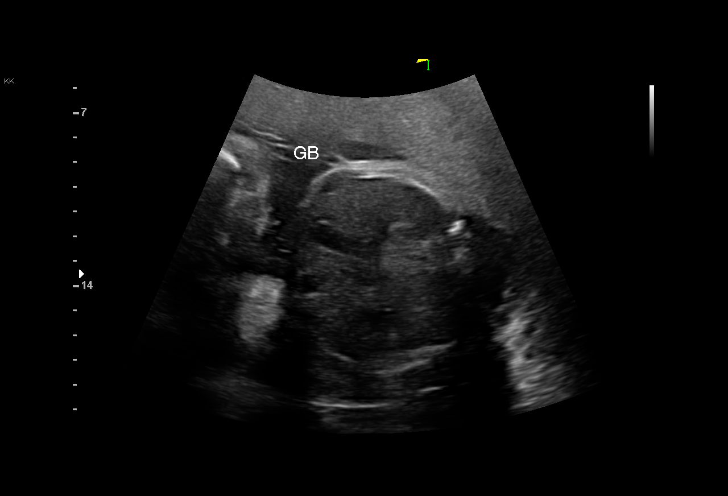
[im 20/32]
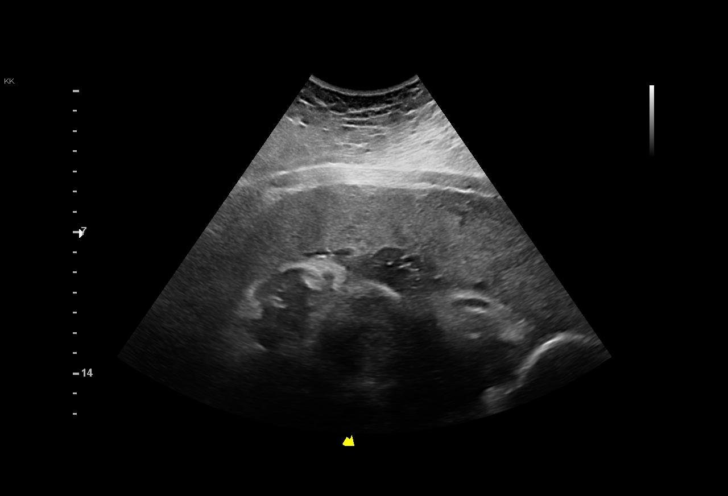
[im 22/32]
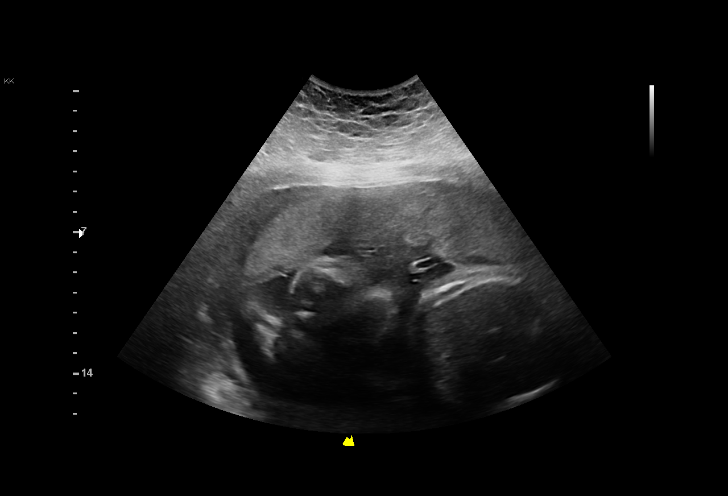
[im 25/32]
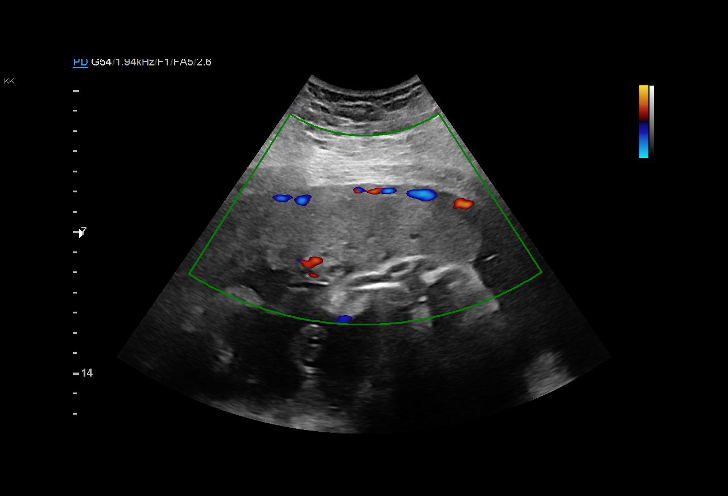
[im 27/32]
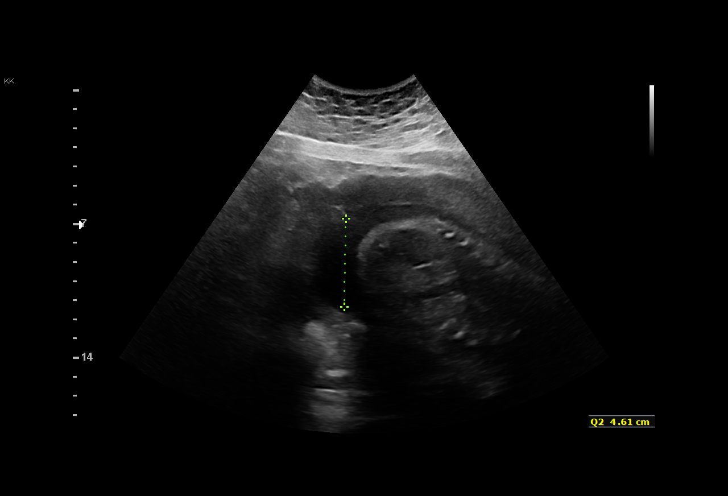
[im 29/32]
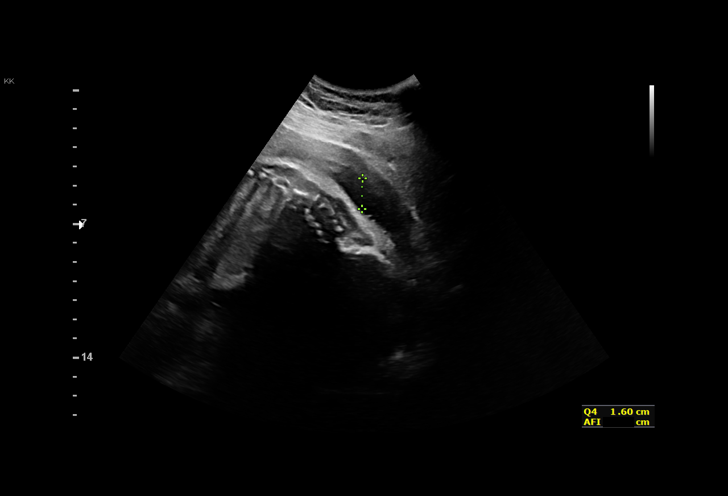
[im 32/32]
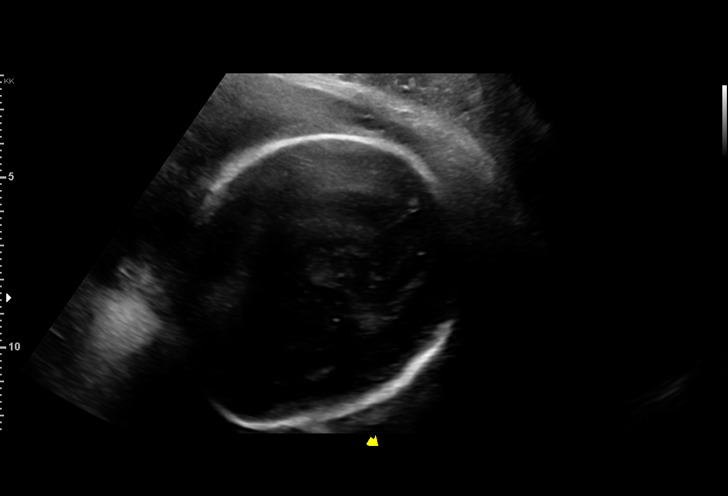

[14 of 28 positions shown; findings below may reference images not displayed]

SE
 ----------------------------------------------------------------------

 ----------------------------------------------------------------------
Indications

  Encounter for other antenatal screening
  follow-up
  Obesity complicating pregnancy, second
  trimester(BMI 62)
  Pre-existing diabetes, type 2, in pregnancy,
  second trimester- insulin
  Tobacco use complicating pregnancy, third
  trimester
  30 weeks gestation of pregnancy
  Poor obstetrical history (Hx drug abuse -
  clean x2 years)
 ----------------------------------------------------------------------
Vital Signs

 BMI:
Fetal Evaluation

 Num Of Fetuses:         1
 Fetal Heart Rate(bpm):  137
 Cardiac Activity:       Observed
 Presentation:           Cephalic
 Placenta:               Anterior
 P. Cord Insertion:      Previously Visualized

 Amniotic Fluid
 AFI FV:      Within normal limits

 AFI Sum(cm)     %Tile       Largest Pocket(cm)
 15.69           56
 RUQ(cm)       RLQ(cm)       LUQ(cm)        LLQ(cm)

Biometry

 BPD:      75.7  mm     G. Age:  30w 3d         45  %    CI:         73.7   %    70 - 86
                                                         FL/HC:      20.7   %    19.2 -
 HC:      280.1  mm     G. Age:  30w 5d         29  %    HC/AC:      1.01        0.99 -
 AC:      278.7  mm     G. Age:  32w 0d         90  %    FL/BPD:     76.5   %    71 - 87
 FL:       57.9  mm     G. Age:  30w 2d         40  %    FL/AC:      20.8   %    20 - 24

 Est. FW:    0500  gm    3 lb 12 oz      73  %
OB History

 Gravidity:    1
Gestational Age

 LMP:           30w 1d        Date:  11/22/17                 EDD:   08/29/18
 U/S Today:     30w 6d                                        EDD:   08/24/18
 Best:          30w 1d     Det. By:  LMP  (11/22/17)          EDD:   08/29/18
Anatomy

 Cranium:               Appears normal         Aortic Arch:            Previously seen
 Cavum:                 Previously seen        Ductal Arch:            Previously seen
 Ventricles:            Appears normal         Diaphragm:              Previously seen
 Choroid Plexus:        Previously seen        Stomach:                Appears normal, left
                                                                       sided
 Cerebellum:            Previously seen        Abdomen:                Appears normal
 Posterior Fossa:       Previously seen        Abdominal Wall:         Previously seen
 Nuchal Fold:           Previously seen        Cord Vessels:           Previously seen
 Face:                  Orbits and profile     Kidneys:                Appear normal
                        previously seen
 Lips:                  Previously seen        Bladder:                Appears normal
 Thoracic:              Appears normal         Spine:                  Previously seen
 Heart:                 Previously seen        Upper Extremities:      Previously seen
 RVOT:                  Previously seen        Lower Extremities:      Previously seen
 LVOT:                  Previously seen

 Other:  Fetus appears to be female. Heels previously visualized. Technically
         difficult due to maternal habitus and fetal position.
Cervix Uterus Adnexa

 Cervix
 Not visualized (advanced GA >18wks)
Impression

 Amniotic fluid is normal and good fetal activity is seen. Fetal
 growth is appropriate for gestational age.
Recommendations

 Weekly BPP from next visit.
                 Kahr, Gollner

## 2021-05-01 NOTE — Progress Notes (Signed)
MFM Note ? ?Crystal Castillo was seen for a first trimester ultrasound and consultation as her cell free DNA test indicated a high risk possibly due to a twin gestation. The patient's pregnancy has also been complicated by pregestational diabetes, maternal obesity with a BMI of 45 and chronic hepatitis C.   ?She was diagnosed with type 2 diabetes when she was a teenager.  She is currently treated with an insulin pump.  She is awaiting a Dexcom continuous glucose monitor.  Her most recent hemoglobin A1c was 6.0%. ? ?The patient was identified to be positive for hepatitis C in her current pregnancy.  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  She has seen an infectious disease specialist regarding treatment of hepatitis C in pregnancy.  They will hold off on treatment until after delivery. ?   ?Her HIV test was negative.  She is scheduled for a liver ultrasound next week. ? ?On today's ultrasound exam, a twin gestation is noted.  There was a thick dividing membrane separating the two fetuses, indicating that these are dichorionic, diamniotic twins.  Our genetic counselor will notify [name] regarding the twin gestation that was noted on today's ultrasound exam and have them recalculate her cell free DNA test results. ? ?The crown-rump lengths for both twin A and twin B measured consistent with an [facility] of November 10, 2021. ? ?The following were discussed during today's consultation: ? ?Dichorionic, diamniotic twin gestation ? ?The management of dichorionic twins was discussed.  She was advised that management of twin pregnancies will involve frequent ultrasound exams to assess the fetal growth and amniotic fluid level. ? ?A detailed fetal anatomy scan has been scheduled for her at around 19 weeks.  We will continue to follow her with monthly growth ultrasounds. ? ?Weekly fetal testing should be started at around 32 weeks.  Delivery for uncomplicated dichorionic twins should occur by 38 weeks. ? ?The increased  risk of preeclampsia, gestational diabetes, and preterm birth/labor associated with twin pregnancies was discussed.  She was advised that she will continue to be followed closely to assess for these conditions.  ? ?As pregnancies with multiple gestations are at increased risk for developing preeclampsia, she should probably take 2 tablets of baby aspirin (81 mg per day each) daily to decrease her risk of developing preeclampsia. ? ?Hepatitis C and pregnancy ? ?HCV can be transmitted from an infected mother to the child during both pregnancy and childbirth.  The patient states that her first child was tested and does not have hepatitis C. ? ?More than half of new hepatitis C infections progress to chronic HCV infection. Without treatment, approximately 15-20% of people living with chronic HCV infection will develop progressive liver fibrosis and cirrhosis.   ? ?Over 90% of people infected with HCV can be cured with 8-12 weeks of oral therapy.   ? ?According to the CDC, Hepatitis C curative treatment is not currently approved for use during pregnancy; however, once the mother has given birth and completed breastfeeding, it is safe to begin this treatment.  She was advised to seek treatment with an infectious disease specialist once she has delivered and has completed breast-feeding. ? ?Vaginal delivery has not been shown to be a risk factor for vertical transmission of HCV.  She may attempt a vaginal delivery.  A cesarean delivery should be reserved for obstetrical indications. ? ?During the labor process, internal fetal monitors should be avoided to decrease the risk of vertical transmission of HCV. ? ?The  pediatricians should be notified regarding the mother's hepatitis C infection after delivery so that the baby can be monitored and tested.  ? ?Breast-feeding does not seem to affect the risk of vertical transmission of HCV.  Therefore, she may breast-feed the baby unless her nipples are cracked or bleeding.   ? ?Pregestational diabetes and pregnancy ? ?The implications and management of diabetes in pregnancy was discussed in detail with the patient.   ? ?She was advised that our goals for her blood glucose values are fasting values of 90-95 or less and two-hour postprandial values of 120 or less.  The dosage of her insulin pump should be adjusted to help her achieve these values.  The patient was advised that getting her fingerstick values as close to these goals as possible would provide her with the most optimal obstetrical outcome. ? ?The increased risks of fetal congenital malformations such as heart defects and other abnormalities associated with diabetes was discussed.  A detailed fetal anatomy scan has been scheduled for her in our office at 19 weeks.  We will refer her for a fetal echocardiogram at between 21 to 22 weeks.  We will continue to follow her with monthly growth ultrasounds.  ?  ?Once to twice weekly fetal testing using either nonstress tests or biophysical profiles should be started at around 32 weeks. ? ?The increased risk of polyhydramnios, fetal macrosomia, and preeclampsia associated with diabetes was also discussed.   ? ?Obesity in pregnancy ? ?The recommended total weight gain in pregnancy for obese women's between 10 to 20 pounds. ? ?Due to obesity, an early screen for gestational diabetes at between 15 to 16 weeks is recommended.  If the initial early diabetes screening result is negative, a repeat diabetes screen is recommended again at 24 to 28 weeks. ? ?As maternal obesity may present challenges associated with the management of anesthesia, an anesthesia consult should be obtained when she is admitted in labor. ? ?The patient stated that all of her questions have been answered to her satisfaction.   ? ?She will return on Jun 16, 2021 for a detailed fetal anatomy scan. ? ?We will continue to follow her closely with you throughout her pregnancy. ? ?A total of 45 minutes was spent counseling  and coordinating the care for this patient.  Greater than 50% of the time was spent in direct face-to-face contact.  ?

## 2021-05-02 ENCOUNTER — Encounter: Payer: Self-pay | Admitting: *Deleted

## 2021-05-02 LAB — LIVER FIBROSIS, FIBROTEST-ACTITEST
ALT: 31 U/L — ABNORMAL HIGH (ref 6–29)
Alpha-2-Macroglobulin: 271 mg/dL (ref 106–279)
Apolipoprotein A1: 158 mg/dL (ref 101–198)
Bilirubin: 0.3 mg/dL (ref 0.2–1.2)
Fibrosis Score: 0.07
GGT: 10 U/L (ref 3–40)
Haptoglobin: 131 mg/dL (ref 43–212)
Necroinflammat ACT Score: 0.11
Reference ID: 4275240

## 2021-05-02 LAB — HEPATITIS C GENOTYPE: HCV Genotype: 2

## 2021-05-02 LAB — HEPATITIS A ANTIBODY, TOTAL: Hepatitis A AB,Total: NONREACTIVE

## 2021-05-07 ENCOUNTER — Other Ambulatory Visit: Payer: Self-pay

## 2021-05-07 ENCOUNTER — Telehealth: Payer: Self-pay | Admitting: Lactation Services

## 2021-05-07 ENCOUNTER — Encounter: Payer: Self-pay | Admitting: Family Medicine

## 2021-05-07 ENCOUNTER — Telehealth (INDEPENDENT_AMBULATORY_CARE_PROVIDER_SITE_OTHER): Payer: Medicaid Other | Admitting: Family Medicine

## 2021-05-07 VITALS — BP 120/76 | HR 85

## 2021-05-07 DIAGNOSIS — O98411 Viral hepatitis complicating pregnancy, first trimester: Secondary | ICD-10-CM

## 2021-05-07 DIAGNOSIS — R7989 Other specified abnormal findings of blood chemistry: Secondary | ICD-10-CM

## 2021-05-07 DIAGNOSIS — O99331 Smoking (tobacco) complicating pregnancy, first trimester: Secondary | ICD-10-CM

## 2021-05-07 DIAGNOSIS — G8929 Other chronic pain: Secondary | ICD-10-CM

## 2021-05-07 DIAGNOSIS — E119 Type 2 diabetes mellitus without complications: Secondary | ICD-10-CM

## 2021-05-07 DIAGNOSIS — O24311 Unspecified pre-existing diabetes mellitus in pregnancy, first trimester: Secondary | ICD-10-CM

## 2021-05-07 DIAGNOSIS — F17200 Nicotine dependence, unspecified, uncomplicated: Secondary | ICD-10-CM

## 2021-05-07 DIAGNOSIS — O0990 Supervision of high risk pregnancy, unspecified, unspecified trimester: Secondary | ICD-10-CM

## 2021-05-07 DIAGNOSIS — R519 Headache, unspecified: Secondary | ICD-10-CM

## 2021-05-07 DIAGNOSIS — O99341 Other mental disorders complicating pregnancy, first trimester: Secondary | ICD-10-CM

## 2021-05-07 DIAGNOSIS — F603 Borderline personality disorder: Secondary | ICD-10-CM

## 2021-05-07 DIAGNOSIS — O26891 Other specified pregnancy related conditions, first trimester: Secondary | ICD-10-CM

## 2021-05-07 DIAGNOSIS — O99211 Obesity complicating pregnancy, first trimester: Secondary | ICD-10-CM

## 2021-05-07 DIAGNOSIS — O30042 Twin pregnancy, dichorionic/diamniotic, second trimester: Secondary | ICD-10-CM

## 2021-05-07 DIAGNOSIS — Z794 Long term (current) use of insulin: Secondary | ICD-10-CM

## 2021-05-07 DIAGNOSIS — O30041 Twin pregnancy, dichorionic/diamniotic, first trimester: Secondary | ICD-10-CM

## 2021-05-07 DIAGNOSIS — O0991 Supervision of high risk pregnancy, unspecified, first trimester: Secondary | ICD-10-CM

## 2021-05-07 DIAGNOSIS — B1920 Unspecified viral hepatitis C without hepatic coma: Secondary | ICD-10-CM

## 2021-05-07 DIAGNOSIS — O285 Abnormal chromosomal and genetic finding on antenatal screening of mother: Secondary | ICD-10-CM

## 2021-05-07 DIAGNOSIS — O24319 Unspecified pre-existing diabetes mellitus in pregnancy, unspecified trimester: Secondary | ICD-10-CM

## 2021-05-07 MED ORDER — POLYETHYLENE GLYCOL 3350 17 GM/SCOOP PO POWD: 17.0000 g | Freq: Every day | ORAL | 11 refills | Status: DC | PRN

## 2021-05-07 MED ORDER — MAGNESIUM 400 MG PO CAPS: 1.0000 | ORAL_CAPSULE | Freq: Every day | ORAL | 2 refills | Status: DC

## 2021-05-07 MED ORDER — PREPLUS 27-1 MG PO TABS: 1.0000 | ORAL_TABLET | Freq: Every day | ORAL | 11 refills | Status: AC

## 2021-05-07 NOTE — Progress Notes (Signed)
Patient logged on for virtual visit, no issues or concerns at this time. Reports sugars have been running "okay". Did make an adjustment due basal rate to fasting sugars being higher then normal. Running around 90-95 now.  ? ?Has been keeping log of sugars on meter. ?Has been having headaches recently, states at this time in her last pregnancy she started experiencing this issue and was put on magnesium and they were resolved.  ? ?Appointment with Levada Dy has not been scheduled due to PA needed for Dexcom, currently using Omnipod.  ? ?Altha Harm, CMA ?

## 2021-05-07 NOTE — Progress Notes (Signed)
I connected with Crystal Castillo 05/07/21 at 10:55 AM EDT by: MyChart video and verified that I am speaking with the correct person using two identifiers.  Patient is located at home and provider is located at [facility].   ?  ?The purpose of this virtual visit is to provide medical care while limiting exposure to the novel coronavirus. I discussed the limitations, risks, security and privacy concerns of performing an evaluation and management service by MyChart video and the availability of in person appointments. I also discussed with the patient that there may be a patient responsible charge related to this service. By engaging in this virtual visit, you consent to the provision of healthcare.  Additionally, you authorize for your insurance to be billed for the services provided during this visit.  The patient expressed understanding and agreed to proceed. ? ?The following staff members participated in the virtual visit:  Venora Maples, MD/MPH ?Attending Family Medicine Physician, Faculty Practice ?Center for [employer], [facility] ? ? ? ? ?PRENATAL VISIT NOTE ? ?Subjective:  ?Crystal Castillo is a 31 y.o. G2P1001 at [redacted]w[redacted]d  for phone visit for ongoing prenatal care.  She is currently monitored for the following issues for this high-risk pregnancy and has Major depressive disorder, recurrent severe without psychotic features ([name]); Tobacco use disorder; Borderline personality disorder ([name]); Maternal morbid obesity, antepartum ([name]); Pre-existing insulin treated diabetes mellitus during pregnancy ([name]); Drug abuse ([name]); Type 2 diabetes mellitus without complication ([name]); Supervision of high risk pregnancy, antepartum; Abnormal TSH; Abnormal LFTs; Hepatitis C infection; Dichorionic diamniotic twin pregnancy; and Abnormal genetic test during pregnancy on their problem list. ? ?Patient reports no complaints.  Contractions: Not present. Vag. Bleeding: None.  Movement: Absent. Denies  leaking of fluid.  ? ?The following portions of the patient's history were reviewed and updated as appropriate: allergies, current medications, past family history, past medical history, past social history, past surgical history and problem list.  ? ?Objective:  ? ?Vitals:  ? 05/07/21 1055  ?BP: 120/76  ?Pulse: 85  ? Self-Obtained ? ?Fetal Status:     Movement: Absent    ? ?Assessment and Plan:  ?Pregnancy: G2P1001 at [redacted]w[redacted]d ?1. Supervision of high risk pregnancy, antepartum ?BP normal ?Does not yet feel movement ?Refill sent for prenatals ?- CH[facility] AMB BABYSCRIPTS SCHEDULE OPTIMIZATION ? ?2. Chronic nonintractable headache, unspecified headache type ?Reports mild headaches, improved in last pregnancy with magnesium, rx sent to her pharmacy ?- Magnesium 400 MG CAPS; Take 1 capsule by mouth daily.  Dispense: 90 capsule; Refill: 2 ? ?3. Pre-existing insulin treated diabetes mellitus during pregnancy ([name]) ?Currently using omnipod but still having difficulty obtaining dexcom so still doing fingersticks ?Will try to see how we can assist ?Adjusted basal rate to 1.35 from 1.15 on her own ?Has been calculating carbs, increased ratio to 12, usually around 3-7u per basal ?Feeling bad that she adjusted insulin on her own. Discussed this is not an issue since she is so invested in her sugars and clearly very knowledgeable about how ot use the pump ?Did advise that she not adjust them more then 10% in a single day, and if having lows needs to contact us ASAP ?Following w MFM ?Will need fetal echo ? ?4. Hepatitis C virus infection without hepatic coma, unspecified chronicity ?Surprise diagnosis this pregnancy ?Knows she likely contracted this about five years ago when she had a period of IVDU with a partner ?Has been abstinent for past five years ?Looking into  treatment for postpartum ? ?5. Maternal morbid obesity, antepartum ([name]) ? ? ?6. Tobacco use disorder ?Quit smoking three ago, removed from problem list ? ?7. Dichorionic  diamniotic twin pregnancy in second trimester ?New diagnosis after abnormal NIPS ?NIPS being redone with this new diagnosis, still pending ? ?8. Abnormal genetic test during pregnancy ?Abnormal NIPS but being recalculated due to discovery of twin gestation ? ?9. Abnormal LFTs ?Barely elevated AST ? ?10. Abnormal TSH ?Free T4 normal, recheck with 28 week labs ? ?11. Borderline personality disorder ([name]) ?Reports a lot of anxiety at present but she is coping at present ?Wondering if there is a medicine she can take though she doesn't want to start at present ?Reassured her that zoloft would be fine if she needs it, can also refer to [employer] if needed ? ?Preterm labor symptoms and general obstetric precautions including but not limited to vaginal bleeding, contractions, leaking of fluid and fetal movement were reviewed in detail with the patient. ? ?Return in 2 weeks (on 05/21/2021) for [facility], ob visit, review sugar log. ? ?Future Appointments  ?Date Time Provider Department Center  ?05/08/2021  9:00 AM [facility] 1 [facility]  ?05/22/2021 11:00 AM Daiva Eves, Lisette Grinder, MD [facility]  ?06/16/2021 10:15 AM [facility] NURSE [facility]  ?06/16/2021 10:30 AM [facility] US3 [facility]  ? ? ? ?Time spent on virtual visit: 20 minutes ? ?Venora Maples, MD ? ?

## 2021-05-07 NOTE — Patient Instructions (Signed)

## 2021-05-07 NOTE — Telephone Encounter (Signed)
Received message from [facility] that they need address of office to process PA for Dexcom G6.  ? ?Called and was able to get PA resubmitted and will take up to 24 hours to process.  ?

## 2021-05-08 ENCOUNTER — Ambulatory Visit (HOSPITAL_COMMUNITY)
Admission: RE | Admit: 2021-05-08 | Discharge: 2021-05-08 | Disposition: A | Discharge status: Home / Self Care | Payer: Medicaid Other | Source: Ambulatory Visit | Attending: Infectious Disease | Admitting: Infectious Disease

## 2021-05-08 DIAGNOSIS — B182 Chronic viral hepatitis C: Secondary | ICD-10-CM | POA: Diagnosis present

## 2021-05-21 ENCOUNTER — Ambulatory Visit (INDEPENDENT_AMBULATORY_CARE_PROVIDER_SITE_OTHER): Payer: Medicaid Other | Admitting: Obstetrics and Gynecology

## 2021-05-21 ENCOUNTER — Encounter: Payer: Self-pay | Admitting: Obstetrics and Gynecology

## 2021-05-21 VITALS — BP 113/72 | HR 87 | Wt 290.0 lb

## 2021-05-21 DIAGNOSIS — Z794 Long term (current) use of insulin: Secondary | ICD-10-CM

## 2021-05-21 DIAGNOSIS — O0990 Supervision of high risk pregnancy, unspecified, unspecified trimester: Secondary | ICD-10-CM

## 2021-05-21 DIAGNOSIS — B1920 Unspecified viral hepatitis C without hepatic coma: Secondary | ICD-10-CM

## 2021-05-21 DIAGNOSIS — O30042 Twin pregnancy, dichorionic/diamniotic, second trimester: Secondary | ICD-10-CM

## 2021-05-21 DIAGNOSIS — O24319 Unspecified pre-existing diabetes mellitus in pregnancy, unspecified trimester: Secondary | ICD-10-CM

## 2021-05-21 NOTE — Progress Notes (Signed)
Patient reports "mild tightness" on right side of pelvis around 2 night ago. Denies any pain or vaginal bleeding.  ?

## 2021-05-21 NOTE — Progress Notes (Signed)
? ?  PRENATAL VISIT NOTE ? ?Subjective:  ?Crystal Castillo is a 31 y.o. G2P1001 at [redacted]w[redacted]d being seen today for ongoing prenatal care.  She is currently monitored for the following issues for this high-risk pregnancy and has Major depressive disorder, recurrent severe without psychotic features ([place]); Borderline personality disorder ([place]); Maternal morbid obesity, antepartum ([place]); Pre-existing insulin treated diabetes mellitus during pregnancy ([place]); Drug abuse ([place]); Type 2 diabetes mellitus without complication ([place]); Supervision of high risk pregnancy, antepartum; Abnormal TSH; Abnormal LFTs; Hepatitis C infection; Dichorionic diamniotic twin pregnancy; and Abnormal genetic test during pregnancy on their problem list. ? ?Patient reports no complaints.  Contractions: Not present. Vag. Bleeding: None.  Movement: Absent. Denies leaking of fluid.  ? ?The following portions of the patient's history were reviewed and updated as appropriate: allergies, current medications, past family history, past medical history, past social history, past surgical history and problem list.  ? ?Objective:  ? ?Vitals:  ? 05/21/21 1425  ?BP: 113/72  ?Pulse: 87  ?Weight: 290 lb (131.5 kg)  ? ? ?Fetal Status: Fetal Heart Rate (bpm): + FH x 2   Movement: Absent    ? ?General:  Alert, oriented and cooperative. Patient is in no acute distress.  ?Skin: Skin is warm and dry. No rash noted.   ?Cardiovascular: Normal heart rate noted  ?Respiratory: Normal respiratory effort, no problems with respiration noted  ?Abdomen: Soft, gravid, appropriate for gestational age.  Pain/Pressure: Absent     ?Pelvic: Cervical exam deferred        ?Extremities: Normal range of motion.  Edema: None  ?Mental Status: Normal mood and affect. Normal behavior. Normal judgment and thought content.  ? ?Assessment and Plan:  ?Pregnancy: G2P1001 at [redacted]w[redacted]d ?1. Supervision of high risk pregnancy, antepartum ?Patient is doing well without complaints ?AFP today ? ?2. Dichorionic  diamniotic twin pregnancy in second trimester ?Follow up growth ultrasound ? ?3. Pre-existing insulin treated diabetes mellitus during pregnancy ([place]) ?CBGs reviewed and great majority within range. Patient admits to some dietary indiscretions such as white bread. Continue omnipod at current setting ?Patient awaiting dexcom ?Will order fetal echo ? ?4. Hepatitis C virus infection without hepatic coma, unspecified chronicity ?Follow up with ID this week ? ?Preterm labor symptoms and general obstetric precautions including but not limited to vaginal bleeding, contractions, leaking of fluid and fetal movement were reviewed in detail with the patient. ?Please refer to After Visit Summary for other counseling recommendations.  ? ?Return in about 4 weeks (around 06/18/2021) for in person, ROB, High risk. ? ?Future Appointments  ?Date Time Provider [place]  ?05/22/2021 11:00 AM Truman Hayward, MD [facility]  ?06/16/2021 10:15 AM [facility] NURSE [facility]  ?06/16/2021 10:30 AM [facility] US3 [facility]  ? ? ?Mora Bellman, MD ? ?

## 2021-05-22 ENCOUNTER — Other Ambulatory Visit: Payer: Self-pay

## 2021-05-22 ENCOUNTER — Encounter: Payer: Self-pay | Admitting: Infectious Disease

## 2021-05-22 ENCOUNTER — Ambulatory Visit (INDEPENDENT_AMBULATORY_CARE_PROVIDER_SITE_OTHER): Payer: Medicaid Other | Admitting: Infectious Disease

## 2021-05-22 VITALS — BP 128/80 | HR 91 | Temp 97.9°F | Wt 294.0 lb

## 2021-05-22 DIAGNOSIS — Z7185 Encounter for immunization safety counseling: Secondary | ICD-10-CM | POA: Insufficient documentation

## 2021-05-22 DIAGNOSIS — B182 Chronic viral hepatitis C: Secondary | ICD-10-CM

## 2021-05-22 DIAGNOSIS — Z23 Encounter for immunization: Secondary | ICD-10-CM

## 2021-05-22 DIAGNOSIS — B1920 Unspecified viral hepatitis C without hepatic coma: Secondary | ICD-10-CM

## 2021-05-22 DIAGNOSIS — K7460 Unspecified cirrhosis of liver: Secondary | ICD-10-CM

## 2021-05-22 HISTORY — DX: Unspecified cirrhosis of liver: K74.60

## 2021-05-22 HISTORY — DX: Encounter for immunization safety counseling: Z71.85

## 2021-05-22 NOTE — Progress Notes (Signed)
? ?Subjective:  ?Chief complaint: followup for HCV ? ? Patient ID: Crystal Castillo, female    DOB: September 27, 1990, 31 y.o.   MRN: [id number] ? ?HPI ? ? ?Crystal Castillo is a 31 year old Caucasian woman with a past medical history significant for diabetes mellitus obesity PTSD and prior substance abuse including IV drugs and heavy alcohol use who has completely stopped using any intravenous drugs ever since she met her current husband.  She also has drank nearly no alcohol for years. ? ?She has had young child who accompanies her to her visit and is currently pregnant. ? ?She was found to be hepatitis C positive with a viral load of 2,050,000 copies. ? ?She does not have hepatitis B coinfection and she is HIV negative. ? ?She is genotype 2. Her fibrosure score was F0. Elastography showed possible cirrhosis but was not conclusive.  ? ?She is pregnant with 2 baby girls. ? ?Past Medical History:  ?Diagnosis Date  ? Anorexia nervosa with bulimia   ? just finished treatment in August 2012 for this  ? Anxiety   ? Bartholin's cyst   ? Depression   ? Diabetes mellitus   ? not currently treated for this. Pt states she was told she had DM, but lost @  150 lbs in 9 months.  ? Diabetes mellitus without complication ([place])   ? Hepatitis C   ? Obesity   ? PTSD (post-traumatic stress disorder)   ? ? ?Past Surgical History:  ?Procedure Laterality Date  ? TONSILLECTOMY    ? WISDOM TOOTH EXTRACTION    ? ? ?Family History  ?Problem Relation Age of Onset  ? Diabetes Mother   ? Asthma Mother   ? Alcohol abuse Father   ? Hypertension Father   ? COPD Father   ? ? ?  ?Social History  ? ?Socioeconomic History  ? Marital status: Married  ?  Spouse name: Not on file  ? Number of children: Not on file  ? Years of education: Not on file  ? Highest education level: Not on file  ?Occupational History  ? Not on file  ?Tobacco Use  ? Smoking status: Former  ?  Packs/day: 0.50  ?  Types: Cigarettes  ?  Quit date: 04/11/2018  ?  Years since quitting: 3.1  ? Smokeless  tobacco: Never  ? Tobacco comments:  ?  rarely smokes  ?Vaping Use  ? Vaping Use: Former  ?Substance and Sexual Activity  ? Alcohol use: Not Currently  ?  Comment: occasional drinker - 2 or 3 times a week  ? Drug use: Not Currently  ?  Comment: clean for 5 years  ? Sexual activity: Yes  ?  Birth control/protection: None  ?Other Topics Concern  ? Not on file  ?Social History Narrative  ? ** Merged History Encounter **  ?    ? ?Social Determinants of Health  ? ?Financial Resource Strain: Not on file  ?Food Insecurity: Not on file  ?Transportation Needs: Not on file  ?Physical Activity: Not on file  ?Stress: Not on file  ?Social Connections: Not on file  ? ? ?Allergies  ?Allergen Reactions  ? Quetiapine Itching  ? Sulfa Antibiotics Nausea Only  ? Sulfa Antibiotics Nausea Only  ? ? ? ?Current Outpatient Medications:  ?  Accu-Chek Softclix Lancets lancets, To check blood sugars 4 times a day. Fasting and 2 hours after the first bite of breakfast, lunch and dinner., Disp: 100 each, Rfl: 12 ?  acetaminophen (TYLENOL)  325 MG tablet, Take 650 mg by mouth every 6 (six) hours as needed., Disp: , Rfl:  ?  aspirin EC 81 MG tablet, Take 1 tablet (81 mg total) by mouth daily., Disp: 90 tablet, Rfl: 6 ?  Blood Pressure Monitoring DEVI, 1 each by Does not apply route once a week., Disp: 1 each, Rfl: 0 ?  glucose blood (ACCU-CHEK GUIDE) test strip, To check blood sugars 4 times a day. Fasting and 2 hours after the first bite of breakfast, lunch and dinner., Disp: 100 each, Rfl: 12 ?  insulin aspart (NOVOLOG) 100 UNIT/ML injection, For use in Omnipod. Current daily dosage is 54 units per day. To be adjusted by Physician as needed, Disp: 20 mL, Rfl: 8 ?  Insulin Disposable Pump (OMNIPOD DASH INTRO, GEN 4,) KIT, 1 kit by Does not apply route every 3 (three) days., Disp: 1 kit, Rfl: 0 ?  Insulin Disposable Pump (OMNIPOD DASH PODS, GEN 4,) MISC, 1 Device by Does not apply route every 3 (three) days., Disp: 3 each, Rfl: 8 ?  Magnesium 400  MG CAPS, Take 1 capsule by mouth daily., Disp: 90 capsule, Rfl: 2 ?  polyethylene glycol powder (GLYCOLAX/MIRALAX) 17 GM/SCOOP powder, Take 17 g by mouth daily as needed., Disp: 510 g, Rfl: 11 ?  Prenatal Vit-Fe Fumarate-FA (PREPLUS) 27-1 MG TABS, Take 1 tablet by mouth daily., Disp: 30 tablet, Rfl: 11 ?  Continuous Blood Gluc Transmit (DEXCOM G6 TRANSMITTER) MISC, 1 Device by Does not apply route every 3 (three) months. (Patient not taking: Reported on 05/21/2021), Disp: 1 each, Rfl: 2 ?  insulin lispro (HUMALOG) 100 UNIT/ML injection, Inject 0.5 mLs (50 Units total) into the skin daily. Via pump and to cover meals/snacks as directed, Disp: 20 mL, Rfl: 11 ? ? ? ?Review of Systems  ?Constitutional:  Negative for activity change, appetite change, chills, diaphoresis, fatigue, fever and unexpected weight change.  ?HENT:  Negative for congestion, rhinorrhea, sinus pressure, sneezing, sore throat and trouble swallowing.   ?Eyes:  Negative for photophobia and visual disturbance.  ?Respiratory:  Negative for cough, chest tightness, shortness of breath, wheezing and stridor.   ?Cardiovascular:  Negative for chest pain, palpitations and leg swelling.  ?Gastrointestinal:  Negative for abdominal distention, abdominal pain, anal bleeding, blood in stool, constipation, diarrhea, nausea and vomiting.  ?Genitourinary:  Negative for difficulty urinating, dysuria, flank pain and hematuria.  ?Musculoskeletal:  Negative for arthralgias, back pain, gait problem, joint swelling and myalgias.  ?Skin:  Negative for color change, pallor, rash and wound.  ?Neurological:  Negative for dizziness, tremors, weakness and light-headedness.  ?Hematological:  Negative for adenopathy. Does not bruise/bleed easily.  ?Psychiatric/Behavioral:  Negative for agitation, behavioral problems, confusion, decreased concentration, dysphoric mood and sleep disturbance.   ? ?   ?Objective:  ? Physical Exam ?Constitutional:   ?   General: She is not in acute  distress. ?   Appearance: Normal appearance. She is well-developed. She is obese. She is not ill-appearing, toxic-appearing or diaphoretic.  ?HENT:  ?   Head: Normocephalic and atraumatic.  ?   Right Ear: Hearing and external ear normal.  ?   Left Ear: Hearing and external ear normal.  ?   Nose: Nose normal. No nasal deformity or rhinorrhea.  ?Eyes:  ?   General: No scleral icterus. ?   Conjunctiva/sclera: Conjunctivae normal.  ?   Right eye: Right conjunctiva is not injected.  ?   Left eye: Left conjunctiva is not injected.  ?   Pupils:  Pupils are equal, round, and reactive to light.  ?Neck:  ?   Vascular: No JVD.  ?Cardiovascular:  ?   Rate and Rhythm: Normal rate and regular rhythm.  ?   Heart sounds: S1 normal and S2 normal.  ?Pulmonary:  ?   Effort: No respiratory distress.  ?   Breath sounds: No wheezing.  ?Abdominal:  ?   General: Bowel sounds are normal.  ?   Palpations: Abdomen is soft.  ?Musculoskeletal:     ?   General: Normal range of motion.  ?   Right shoulder: Normal.  ?   Left shoulder: Normal.  ?   Cervical back: Normal range of motion and neck supple.  ?   Right hip: Normal.  ?   Left hip: Normal.  ?   Right knee: Normal.  ?   Left knee: Normal.  ?Lymphadenopathy:  ?   Head:  ?   Right side of head: No submandibular, preauricular or posterior auricular adenopathy.  ?   Left side of head: No submandibular, preauricular or posterior auricular adenopathy.  ?   Cervical: No cervical adenopathy.  ?   Right cervical: No superficial or deep cervical adenopathy. ?   Left cervical: No superficial or deep cervical adenopathy.  ?Skin: ?   General: Skin is warm and dry.  ?   Coloration: Skin is not pale.  ?   Findings: No abrasion, bruising, ecchymosis, erythema, lesion or rash.  ?   Nails: There is no clubbing.  ?Neurological:  ?   General: No focal deficit present.  ?   Mental Status: She is alert and oriented to person, place, and time.  ?   Sensory: No sensory deficit.  ?   Coordination: Coordination  normal.  ?   Gait: Gait normal.  ?Psychiatric:     ?   Attention and Perception: She is attentive.     ?   Mood and Affect: Mood normal.     ?   Speech: Speech normal.     ?   Behavior: Behavior normal. Behavior is

## 2021-05-23 LAB — AFP, SERUM, OPEN SPINA BIFIDA
AFP MoM: 3.27
AFP Value: 55.6 ng/mL
Gest. Age on Collection Date: 15.2 weeks
Maternal Age At EDD: 29.2 yr
OSBR Risk 1 IN: 83
Test Results:: POSITIVE — AB
Weight: 290 [lb_av]

## 2021-05-23 LAB — HEPATITIS B SURFACE ANTIBODY, QUANTITATIVE: Hep B S AB Quant (Post): 16 m[IU]/mL (ref >=10)

## 2021-05-26 NOTE — Addendum Note (Signed)
Addended by: Catalina Antigua on: 05/26/2021 11:26 AM ? ? Modules accepted: Orders ? ?

## 2021-05-28 ENCOUNTER — Telehealth: Payer: Self-pay

## 2021-05-28 NOTE — Telephone Encounter (Addendum)
-----   Message from Catalina Antigua, MD sent at 05/26/2021 11:26 AM EDT ----- ?Please inform patient of abnormal screening test for AFP. She will be referred for genetic counseling. ? ?Called pt with results and provider recommendation. Genetic counselor recommends appt following anatomy US. Scheduled for same day. Pt states provider told her she should be scheduled for diabetes education follow up. Scheduled for 06/10/21. Pt is still waiting for approval of all Dexcom supplies. ?

## 2021-05-30 ENCOUNTER — Encounter: Payer: Self-pay | Admitting: *Deleted

## 2021-05-30 IMAGING — US US MFM OB FOLLOW UP
1 series · 13 of 28 positions shown · non-contrast
Comparison: none

[Series 1: us mfm ob follow up · 45 acquisitions, 13 frames shown]
[im 2/45]
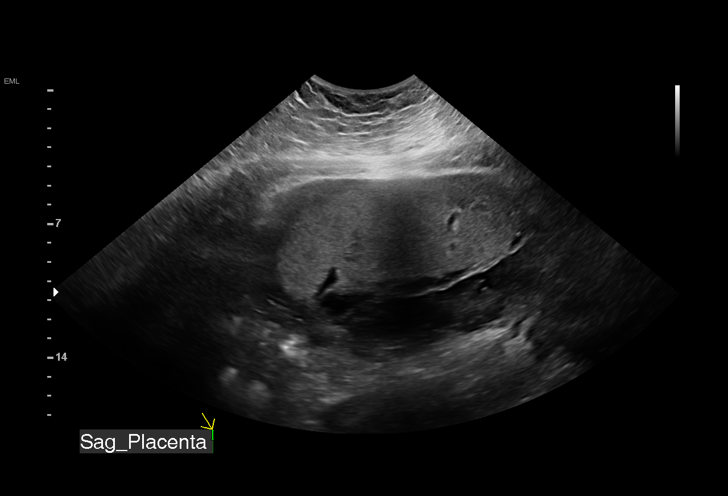
[im 5/45]
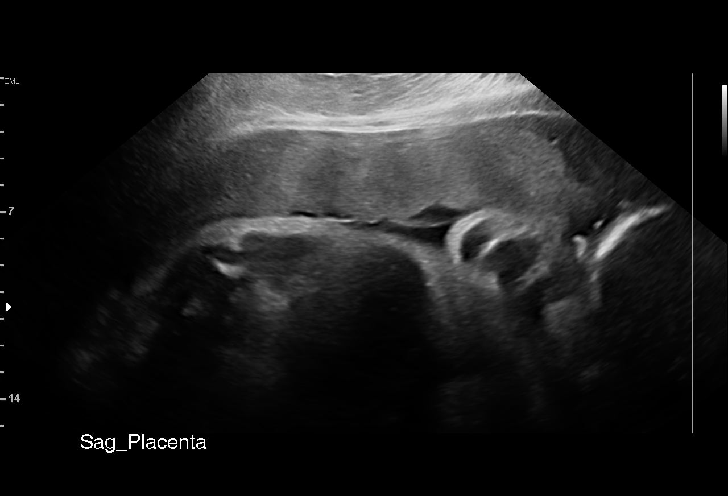
[im 9/45]
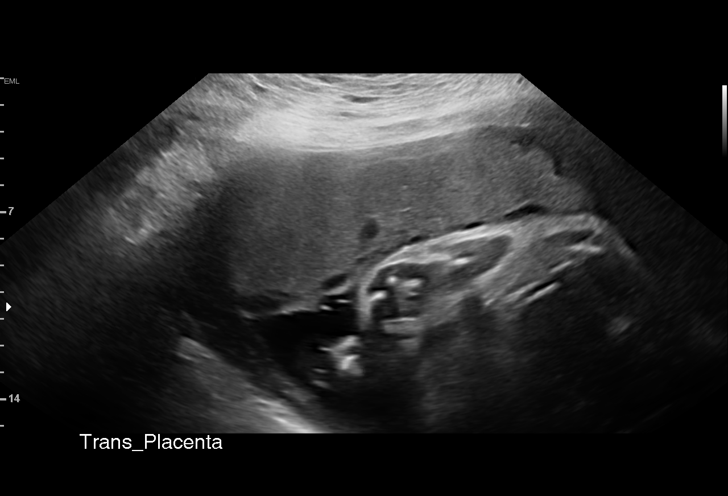
[im 12/45]
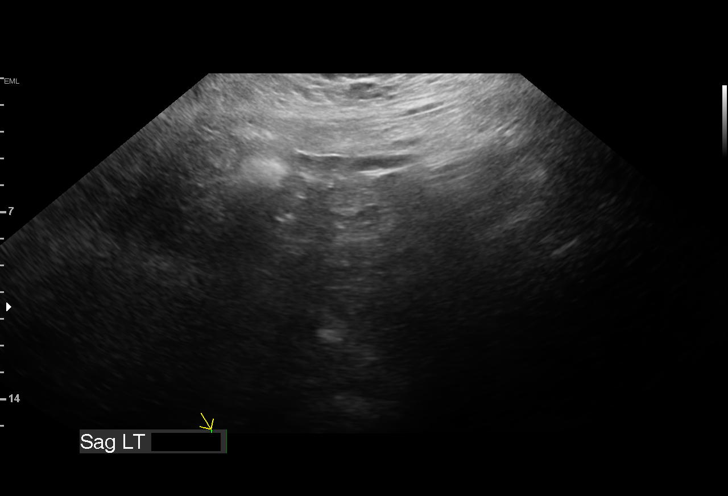
[im 15/45]
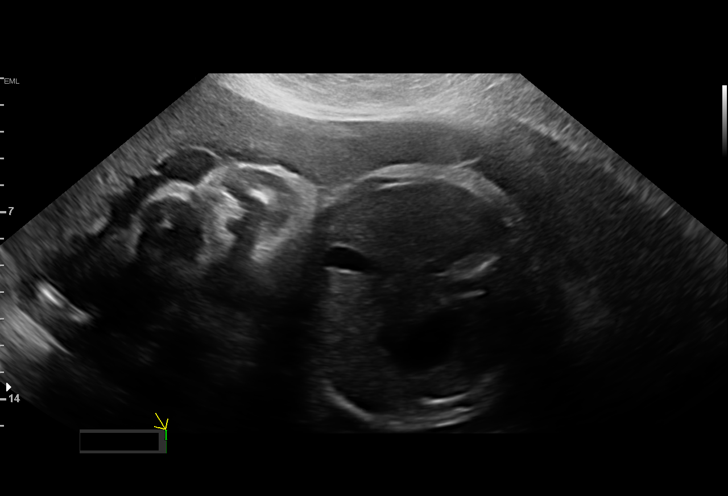
[im 18/45]
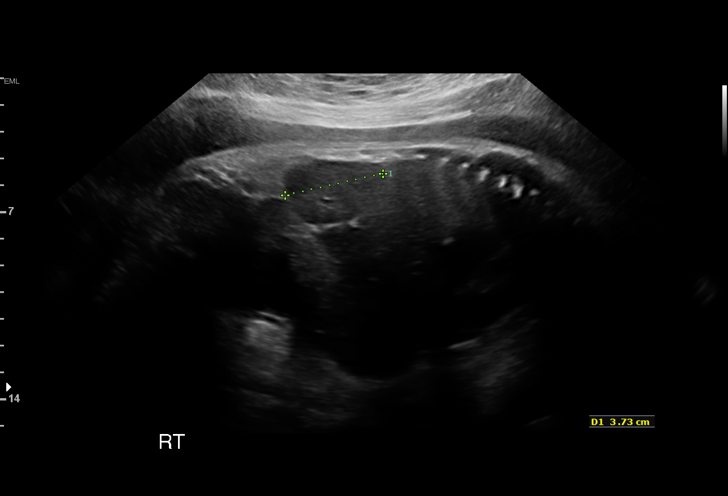
[im 23/45]
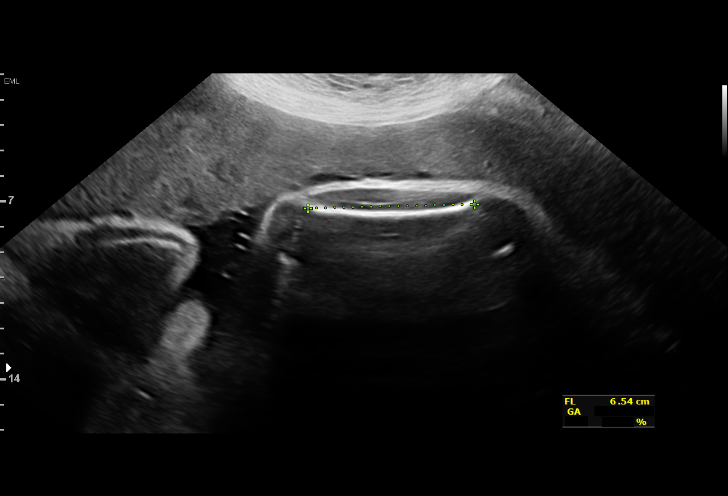
[im 27/45]
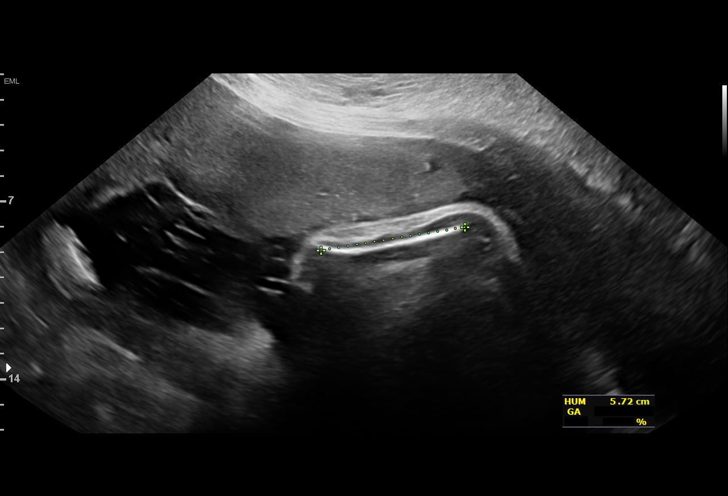
[im 30/45]
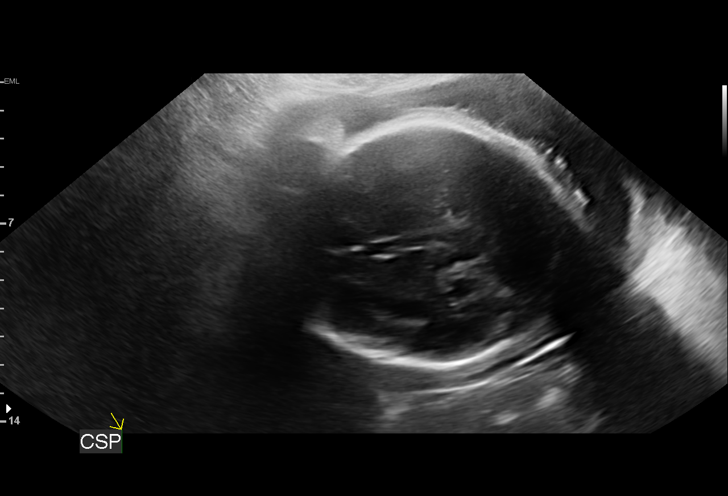
[im 33/45]
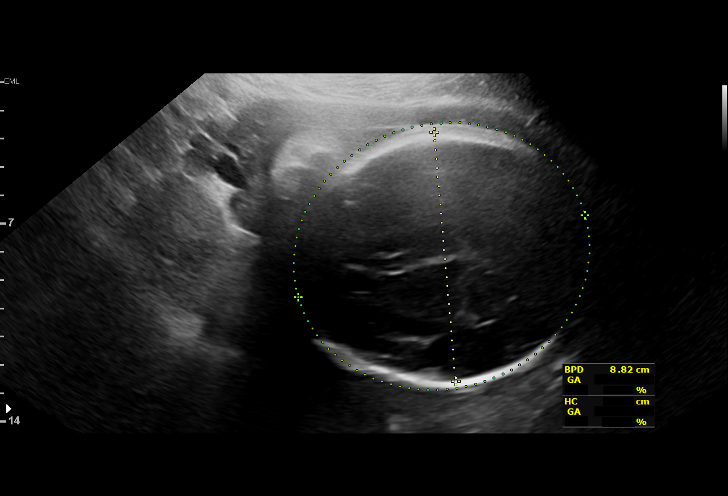
[im 36/45]
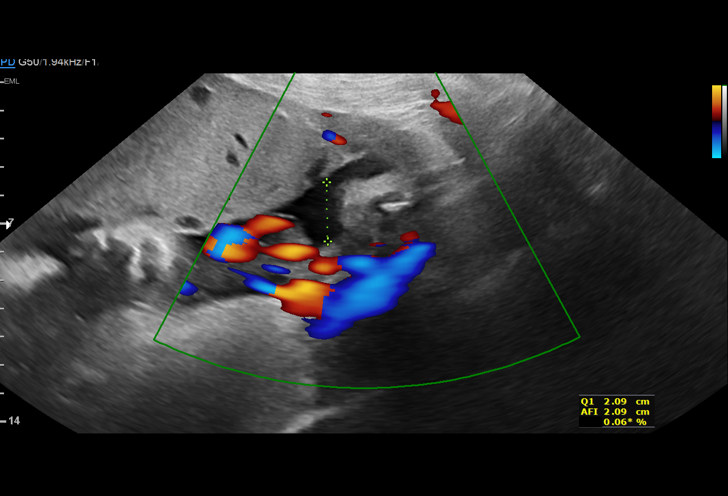
[im 40/45]
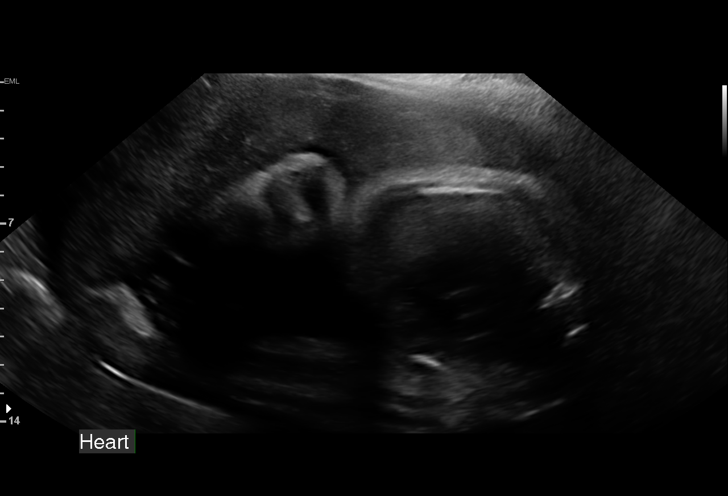
[im 43/45]
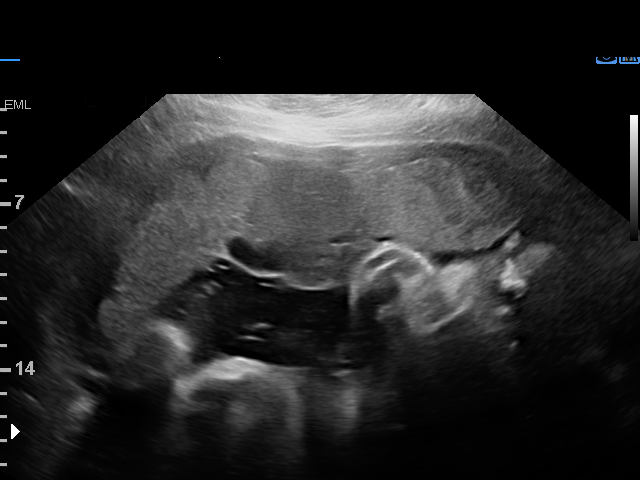

[13 of 28 positions shown; findings below may reference images not displayed]

----------------------------------------------------------------------

 ----------------------------------------------------------------------
Indications

  34 weeks gestation of pregnancy
  Pre-existing diabetes, type 2, in pregnancy,
  third trimester (Insulin)
  Tobacco use complicating pregnancy, third
  trimester
  Poor obstetrical history (Hx drug abuse more
  than 2 years ago)
  Obesity complicating pregnancy, third
  trimester (BMI 45)
 ----------------------------------------------------------------------
Vital Signs

 BMI:
Fetal Evaluation

 Num Of Fetuses:          1
 Fetal Heart Rate(bpm):   132
 Cardiac Activity:        Observed
 Presentation:            Cephalic
 Placenta:                Anterior
 P. Cord Insertion:       Visualized, central

 Amniotic Fluid
 AFI FV:      Within normal limits

 AFI Sum(cm)     %Tile       Largest Pocket(cm)
 9.76            18
 RUQ(cm)       RLQ(cm)       LUQ(cm)        LLQ(cm)

Biophysical Evaluation

 Amniotic F.V:   Within normal limits       F. Tone:         Observed
 F. Movement:    Observed                   Score:           [DATE]
 F. Breathing:   Observed
Biometry

 BPD:      88.6  mm     G. Age:  35w 6d         87  %    CI:        78.28   %    70 - 86
                                                         FL/HC:       20.6  %    19.4 -
 HC:      316.8  mm     G. Age:  35w 4d         48  %    HC/AC:       1.00       0.96 -
 AC:      317.9  mm     G. Age:  35w 5d         88  %    FL/BPD:      73.6  %    71 - 87
 FL:       65.2  mm     G. Age:  33w 4d         24  %    FL/AC:       20.5  %    20 - 24
 HUM:      57.8  mm     G. Age:  33w 4d         45  %

 LV:        3.3  mm

 Est. FW:    2160   gm   5 lb 12 oz      76  %
OB History

 Gravidity:    1
Gestational Age

 LMP:           34w 2d        Date:  11/22/17                 EDD:   08/29/18
 U/S Today:     35w 1d                                        EDD:   08/23/18
 Best:          34w 2d     Det. By:  LMP  (11/22/17)          EDD:   08/29/18
Anatomy

 Cranium:               Appears normal         LVOT:                   Previously seen
 Cavum:                 Appears normal         Aortic Arch:            Previously seen
 Ventricles:            Appears normal         Ductal Arch:            Previously seen
 Choroid Plexus:        Previously seen        Diaphragm:              Appears normal
 Cerebellum:            Appears normal         Stomach:                Appears normal, left
                                                                       sided
 Posterior Fossa:       Appears normal         Abdomen:                Appears normal
 Nuchal Fold:           Not applicable (>20    Abdominal Wall:         Appears nml (cord
                        wks GA)                                        insert, abd wall)
 Face:                  Orbits and profile     Cord Vessels:           Previously seen
                        previously seen
 Lips:                  Previously seen        Kidneys:                Appear normal
 Palate:                Previously seen        Bladder:                Appears normal
 Thoracic:              Appears normal         Spine:                  Previously seen
 Heart:                 Previously seen        Upper Extremities:      Previously seen
 RVOT:                  Previously seen        Lower Extremities:      Previously seen

 Other:  Heels and 5th digit previously seen. Normal genaitalia. Fetus appears
         to be female. Nasal bone previously seen. Technically difficult due to
         maternal habitus and fetal position.
Cervix Uterus Adnexa

 Cervix
 Not visualized (advanced GA >33wks)
 Left Ovary
 Within normal limits.

 Right Ovary
 Within normal limits.

 Adnexa
 No abnormality visualized.
Impression

 Amniotic fluid is normal and good fetal activity is seen.
 Antenatal testing is reassuring. BPP [DATE].
Recommendations

 -Continue weekly BPP till delivery.
                 Labranche, Linus

## 2021-06-04 ENCOUNTER — Telehealth: Payer: Self-pay | Admitting: Lactation Services

## 2021-06-04 NOTE — Telephone Encounter (Signed)
Received approval for Dexcom G6 Sensor. Called Pharmacy to let them know it has been approved. They were closed at this time, Lm for pharmacy on Physician line.  ? ?Called patient and let them know it has been approved. She did not answer. LM that Dexcom G6 has been approved and that pharmacy has been notified to fill for patient.  ?

## 2021-06-10 ENCOUNTER — Encounter: Payer: Medicaid Other | Attending: Family Medicine | Admitting: Registered"

## 2021-06-10 ENCOUNTER — Ambulatory Visit (INDEPENDENT_AMBULATORY_CARE_PROVIDER_SITE_OTHER): Payer: Medicaid Other | Admitting: Registered"

## 2021-06-10 DIAGNOSIS — R102 Pelvic and perineal pain: Secondary | ICD-10-CM

## 2021-06-10 DIAGNOSIS — O24311 Unspecified pre-existing diabetes mellitus in pregnancy, first trimester: Secondary | ICD-10-CM | POA: Diagnosis present

## 2021-06-10 DIAGNOSIS — Z3A09 9 weeks gestation of pregnancy: Secondary | ICD-10-CM | POA: Insufficient documentation

## 2021-06-10 DIAGNOSIS — Z794 Long term (current) use of insulin: Secondary | ICD-10-CM

## 2021-06-10 DIAGNOSIS — O24111 Pre-existing diabetes mellitus, type 2, in pregnancy, first trimester: Secondary | ICD-10-CM | POA: Insufficient documentation

## 2021-06-10 DIAGNOSIS — Z713 Dietary counseling and surveillance: Secondary | ICD-10-CM | POA: Insufficient documentation

## 2021-06-10 DIAGNOSIS — O24319 Unspecified pre-existing diabetes mellitus in pregnancy, unspecified trimester: Secondary | ICD-10-CM

## 2021-06-10 NOTE — Progress Notes (Signed)
Pt states she received her Dexcom supplies so we used part of the appointment for starting the CGM. Pt states she has family members who use insulin pumps and CGM so she is familiar with all the basics, mostly just needed help getting the right apps on phone and starting the transmitter. ? ?Omnipod current settings: ?1.35u /hr = 32.4 units total basal ?~15-30 u/bolus; (3/22 MD visit increased I:C ratio to 12) ? ?Pt denies hypoglycemia. Per log BG appears well controlled ? ? ? ?CGM Training - Dexcom G6 ? ?Start time:1130    End time: 1200 ?Total time: 30 min ? ?[x]   Download Dexcom G6 and Clarity apps to phone ?[x]   Invite to share data through [facility] ?[x]   Getting to know device: ?Sensor/Transmitter:  ?2 hour warmup for new sensor/transmitter ?Waterproof ?Receiver: Phone vs reader. Turn on Location and bluetooth ?[x]   Setting up device (high alert  180, low alert 65) ?[x]   Setting alert profile: ?[x]   Inserting sensor: ?After application it takes 2 hr initial warm up before first reading.  ?[]   Calibrating  ?[x]   Ending sensor session ?[x]   when replacing transmitter, 'forget' transmitter to prevent issues with bluetooth trying to connect to old transmitter. ?[x]   Trouble shooting:  ?[x]   Tape guide:  ?[x]   Insulin dosing from CGM readings.  ? ?Patient has Dexcom tech support and my contact information.  ?

## 2021-06-16 ENCOUNTER — Ambulatory Visit (HOSPITAL_BASED_OUTPATIENT_CLINIC_OR_DEPARTMENT_OTHER): Payer: Medicaid Other

## 2021-06-16 ENCOUNTER — Ambulatory Visit: Payer: Medicaid Other | Admitting: *Deleted

## 2021-06-16 ENCOUNTER — Other Ambulatory Visit: Payer: Self-pay | Admitting: *Deleted

## 2021-06-16 ENCOUNTER — Ambulatory Visit: Payer: Medicaid Other | Attending: Obstetrics and Gynecology

## 2021-06-16 ENCOUNTER — Encounter: Payer: Self-pay | Admitting: *Deleted

## 2021-06-16 VITALS — BP 116/69 | HR 91

## 2021-06-16 DIAGNOSIS — O280 Abnormal hematological finding on antenatal screening of mother: Secondary | ICD-10-CM

## 2021-06-16 DIAGNOSIS — O30042 Twin pregnancy, dichorionic/diamniotic, second trimester: Secondary | ICD-10-CM | POA: Diagnosis present

## 2021-06-16 DIAGNOSIS — O30041 Twin pregnancy, dichorionic/diamniotic, first trimester: Secondary | ICD-10-CM

## 2021-06-16 DIAGNOSIS — O09899 Supervision of other high risk pregnancies, unspecified trimester: Secondary | ICD-10-CM

## 2021-06-16 DIAGNOSIS — O0990 Supervision of high risk pregnancy, unspecified, unspecified trimester: Secondary | ICD-10-CM

## 2021-06-16 DIAGNOSIS — O30002 Twin pregnancy, unspecified number of placenta and unspecified number of amniotic sacs, second trimester: Secondary | ICD-10-CM

## 2021-06-16 DIAGNOSIS — O99212 Obesity complicating pregnancy, second trimester: Secondary | ICD-10-CM

## 2021-06-16 DIAGNOSIS — O24112 Pre-existing diabetes mellitus, type 2, in pregnancy, second trimester: Secondary | ICD-10-CM

## 2021-06-16 NOTE — Progress Notes (Signed)
?Name: Crystal Castillo Indication: Screen positive MSAFP  ?DOB: [birth year] Age: 31 y.o.   ?EDC: 11/10/2021 LMP: 02/03/2021 Referring Provider:  ?Chancy Milroy, MD  ?EGA: [redacted]w[redacted]d Genetic Counselor: ?Staci Righter, MS, CGC  ?OB Hx: G3P1011 Date of Appointment: 06/16/2021  ?Accompanied by: Father of the pregnancy, [place] Prejean Face to Face Time: 23 Minutes  ? ?Previous Testing Completed: ?Sherolyn previously completed Non-Invasive Prenatal Screening (NIPS) in this pregnancy. The result is low risk, consistent with dizygotic fraternal twins. This screening significantly reduces the risk that the current pregnancy has Down syndrome, Trisomy 26, and Trisomy 13, however, the risk is not zero given the limitations of NIPS. Additionally, there are many genetic conditions that cannot be detected by NIPS.  ?[name] previously completed carrier screening. She screened to not be a carrier for Cystic Fibrosis (CF), Spinal Muscular Atrophy (SMA), alpha thalassemia, and beta hemoglobinopathies. A negative result on carrier screening reduces the likelihood of being a carrier, however, does not entirely rule out the possibility.  ? ?Medical & Family History:  ?This is Crystal Castillo's 3rd pregnancy. She has a living, healthy child. She has had a spontaneous loss.  ?Reports a personal history of diabetes and cirrhosis of the liver.  ?Denies personal history of high blood pressure, thyroid conditions, and seizures. ?Denies bleeding, infections, and fevers in this pregnancy. ?Denies using tobacco, alcohol, or street drugs in this pregnancy. ? ?Maternal ethnicity reported as Caucasian and paternal ethnicity reported as African American/Caucasian. Denies consanguinity. ?Everline denied a family history of chromosome conditions, intellectual disability, autism, birth defects, bone/skeletal disorders, blindness, deafness, blood disorders, neuromuscular disorders, still births, early infant deaths, and 3 or more pregnancy losses for one person on her prenatal  intake questionnaire.   ?  ?Genetic Counseling:  ? ?MSAFP high risk for an Open Neural Tube Defect (ONTD) in this dichorionic, diamniotic twin pregnancy; risk is 1 in 42 after the screen. We discussed that the maternal serum AFP was elevated. This could indicate that the pregnancy has an ONTD which is a birth defect involving the incomplete development of the brain, spinal cord, or their protective coverings. The following was copied from Allysen's anatomy ultrasound report from today:  ?Twin A: Maternal left, cephalic presentation, posterior placenta, female fetus. Amniotic fluid is normal and good fetal activity seen. No markers of aneuploidies or fetal structural defects are seen. Fetal biometry is consistent with the previously established dates. Fetal spine could not be evaluated because of fetal position. Intracranial structures appear normal. ?Twin B: Maternal right, upper fetus, transverse lie and head to maternal left, anterior placenta, female fetus. Amniotic fluid is normal and good fetal activity seen. No markers of aneuploidies or fetal structural defects are seen. Fetal biometry is consistent with the previously established dates. Fetal spine could not be evaluated because of fetal position. Intracranial structures appear normal.  ?An appointment was made for her [Crystal Castillo] to return in 4 weeks for completion of fetal anatomy. ?Prenatal detection of ONTDs is largely influenced by the gestational age and type of neural tube defect. A second trimester ultrasound identifies virtually 100% of anencephaly cases. A second trimester ultrasound also routinely evaluates for spina bifida, and examination of the entire length of the spine in the sagittal, axial, and coronal planes in combination with a cranial evaluation identifies many cases: the detection rate is approximately 90-98%. If [name] desires, she can pursue an amniocentesis to evaluate each babies' amniotic fluid AFP with reflex to acetylcholinesterase.  Bird declined amniocentesis at this time.  ?  ? ?  Testing/Screening Options:  ? ?Amniocentesis. This procedure is available for prenatal diagnosis. Possible procedural difficulties and complications that can arise include maternal infection, cramping, bleeding, fluid leakage, and/or pregnancy loss. The risk for pregnancy loss with an amniocentesis is 1/500. Per the [employer] (ACOG) Practice Bulletin 162, all pregnant women should be offered prenatal assessment for aneuploidy by diagnostic testing regardless of maternal age or other risk factors. If indicated, testing that could be ordered on an amniocentesis sample includes amniotic fluid AFP with reflex to acetylcholinesterase, fetal karyotype, fetal microarray, and testing for specific syndromes. ?  ?  ?Patient Plan: ? ?Proceed with: Routine prenatal care ?Informed consent was obtained. All questions were answered. ? ?Declined: Amniocentesis for prenatal diagnosis  ? ?Thank you for sharing in the care of [name] with [place].  ?Please do not hesitate to contact us if you have any questions. ? ?Staci Righter, MS, CGC ?Certified Genetic Counselor ?

## 2021-06-19 ENCOUNTER — Ambulatory Visit (INDEPENDENT_AMBULATORY_CARE_PROVIDER_SITE_OTHER): Payer: Medicaid Other | Admitting: Obstetrics and Gynecology

## 2021-06-19 ENCOUNTER — Encounter: Payer: Self-pay | Admitting: Obstetrics and Gynecology

## 2021-06-19 VITALS — BP 108/72 | HR 79 | Wt 291.8 lb

## 2021-06-19 DIAGNOSIS — O30042 Twin pregnancy, dichorionic/diamniotic, second trimester: Secondary | ICD-10-CM

## 2021-06-19 DIAGNOSIS — B1920 Unspecified viral hepatitis C without hepatic coma: Secondary | ICD-10-CM

## 2021-06-19 DIAGNOSIS — O0990 Supervision of high risk pregnancy, unspecified, unspecified trimester: Secondary | ICD-10-CM

## 2021-06-19 DIAGNOSIS — O24319 Unspecified pre-existing diabetes mellitus in pregnancy, unspecified trimester: Secondary | ICD-10-CM

## 2021-06-19 DIAGNOSIS — O285 Abnormal chromosomal and genetic finding on antenatal screening of mother: Secondary | ICD-10-CM

## 2021-06-19 DIAGNOSIS — Z794 Long term (current) use of insulin: Secondary | ICD-10-CM

## 2021-06-19 LAB — POCT URINALYSIS DIP (DEVICE)
Bilirubin Urine: NEGATIVE
Glucose, UA: NEGATIVE mg/dL
Hgb urine dipstick: NEGATIVE
Ketones, ur: 15 mg/dL — AB
Leukocytes,Ua: NEGATIVE
Nitrite: NEGATIVE
Protein, ur: NEGATIVE mg/dL
Specific Gravity, Urine: 1.02 (ref 1.005–1.030)
Urobilinogen, UA: 0.2 mg/dL (ref 0.0–1.0)
pH: 7 (ref 5.0–8.0)

## 2021-06-19 NOTE — Patient Instructions (Signed)

## 2021-06-20 LAB — COMPREHENSIVE METABOLIC PANEL
ALT: 21 IU/L (ref 0–32)
AST: 20 IU/L (ref 0–40)
Albumin/Globulin Ratio: 1.4 (ref 1.2–2.2)
Albumin: 3.4 g/dL — ABNORMAL LOW (ref 3.9–5.0)
Alkaline Phosphatase: 51 IU/L (ref 44–121)
BUN/Creatinine Ratio: 15 (ref 9–23)
BUN: 7 mg/dL (ref 6–20)
Bilirubin Total: 0.2 mg/dL (ref 0.0–1.2)
CO2: 19 mmol/L — ABNORMAL LOW (ref 20–29)
Calcium: 8.9 mg/dL (ref 8.7–10.2)
Chloride: 104 mmol/L (ref 96–106)
Creatinine, Ser: 0.46 mg/dL — ABNORMAL LOW (ref 0.57–1.00)
Globulin, Total: 2.5 g/dL (ref 1.5–4.5)
Glucose: 79 mg/dL (ref 70–99)
Potassium: 4.1 mmol/L (ref 3.5–5.2)
Sodium: 137 mmol/L (ref 134–144)
Total Protein: 5.9 g/dL — ABNORMAL LOW (ref 6.0–8.5)
eGFR: 134 mL/min/{1.73_m2} (ref >59)

## 2021-06-20 IMAGING — US US MFM FETAL BPP W/NONSTRESS
1 series · 15 of 25 positions shown · non-contrast
Comparison: none

[Series 1: us mfm fetal bpp w/nonstress · 25 acquisitions, 15 frames shown]
[im 1/25]
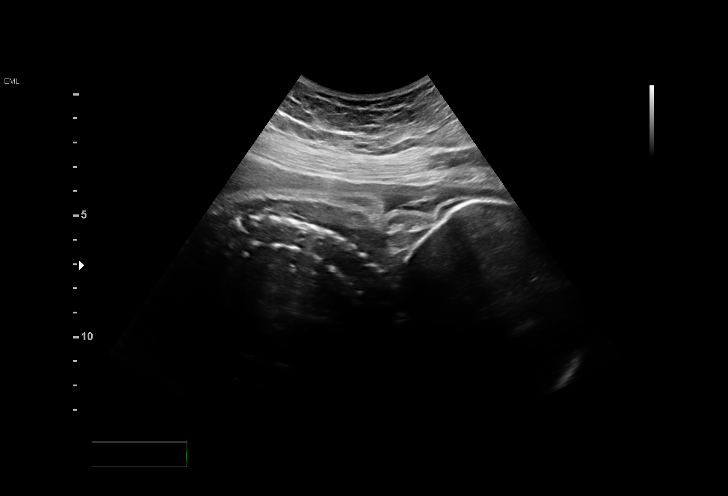
[im 3/25]
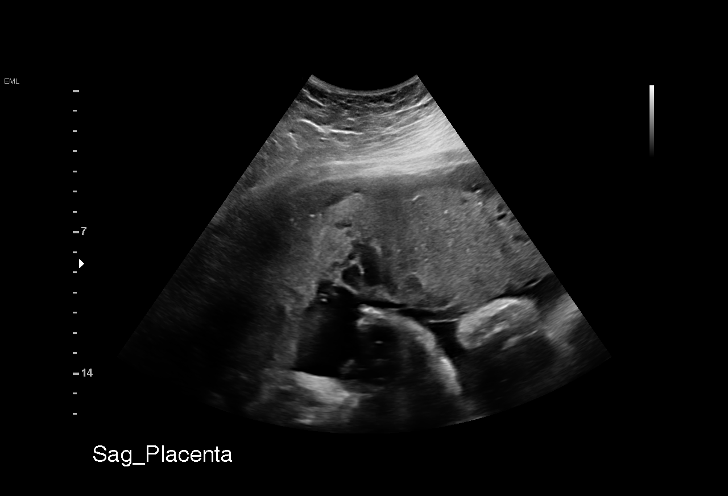
[im 5/25]
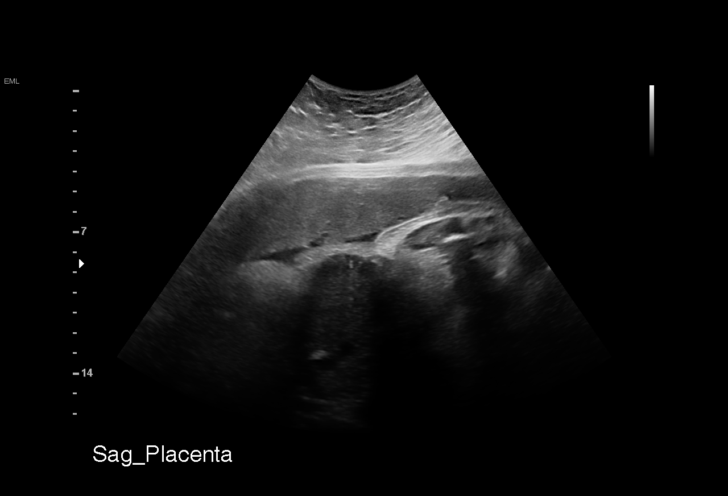
[im 6/25]
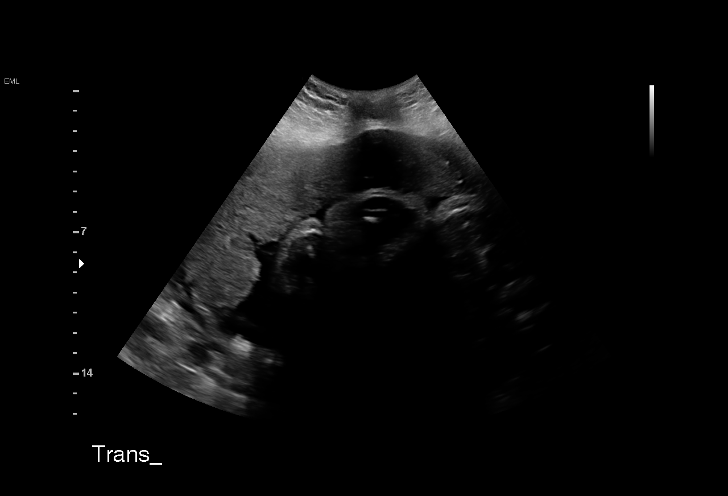
[im 8/25]
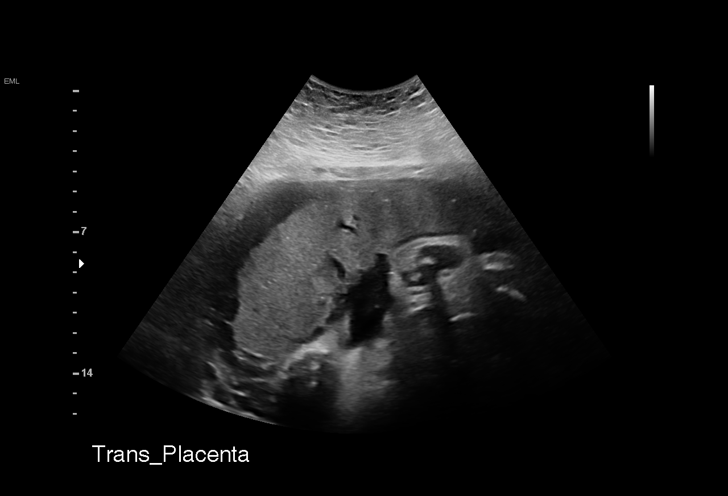
[im 10/25]
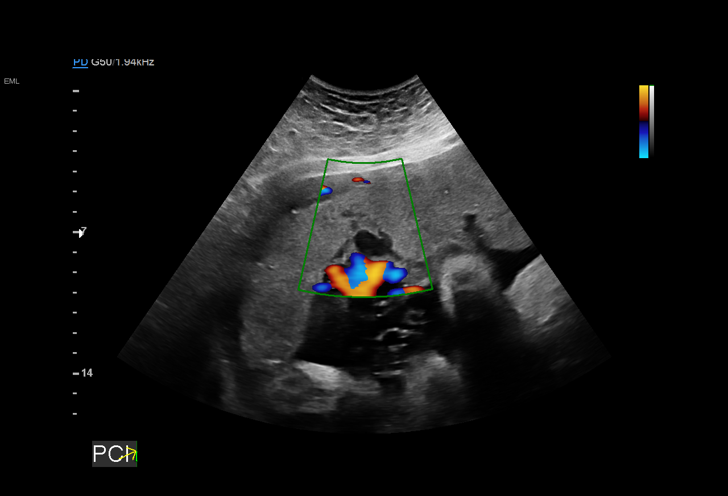
[im 11/25]
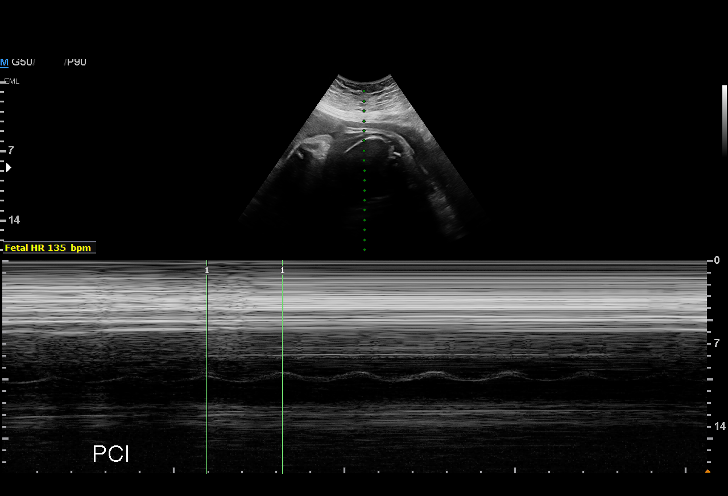
[im 13/25]
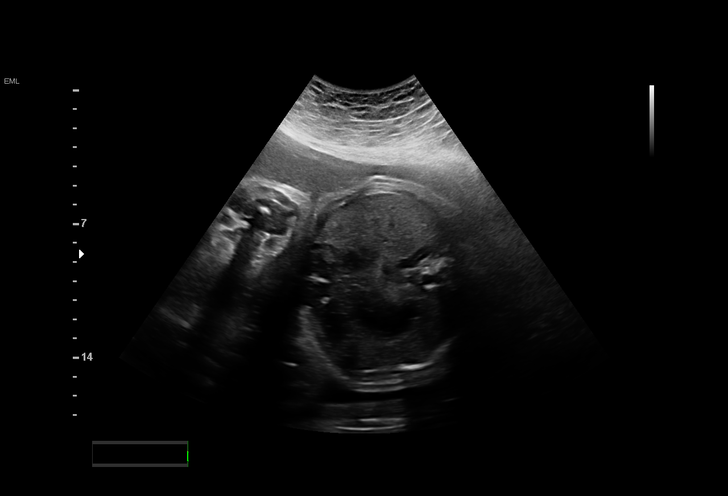
[im 15/25]
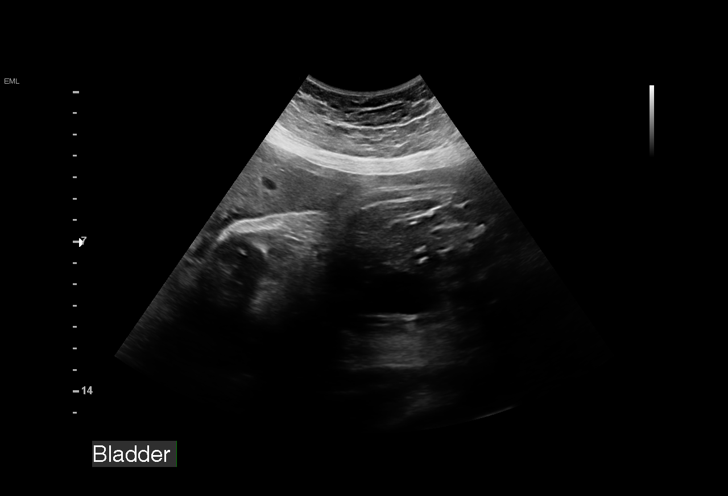
[im 16/25]
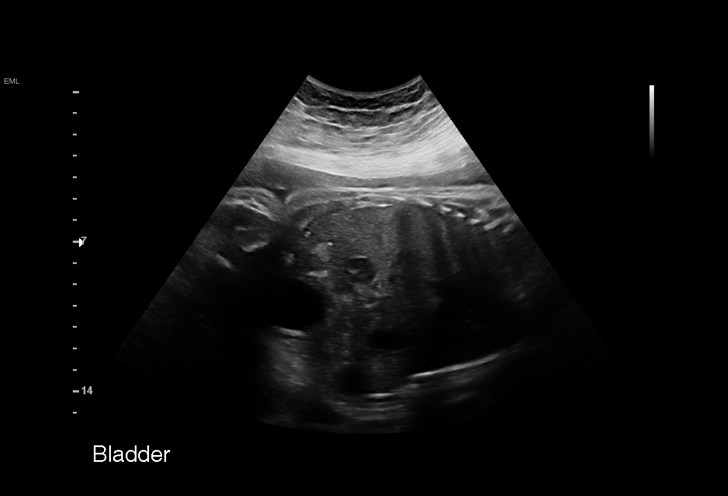
[im 18/25]
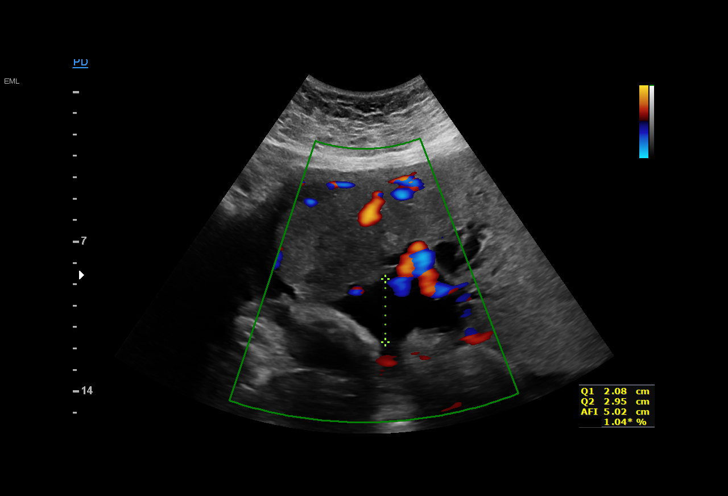
[im 20/25]
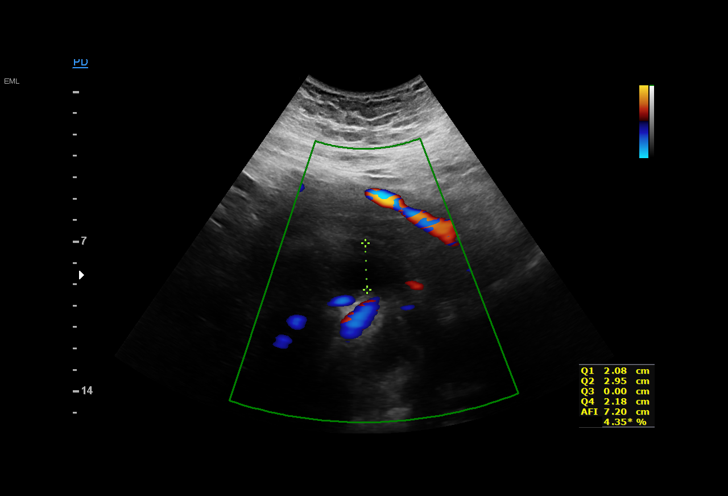
[im 21/25]
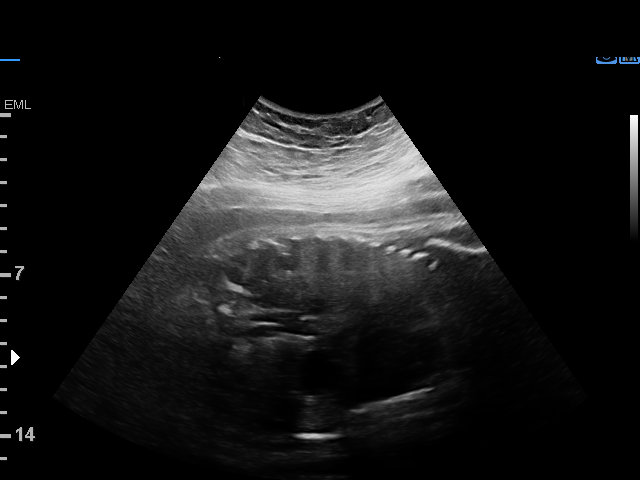
[im 23/25]
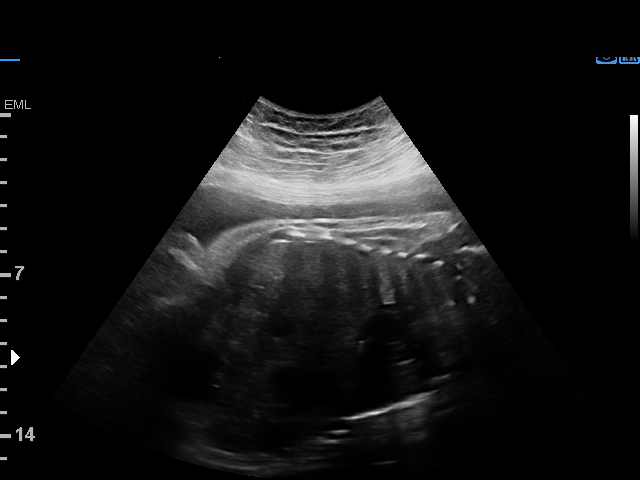
[im 25/25]
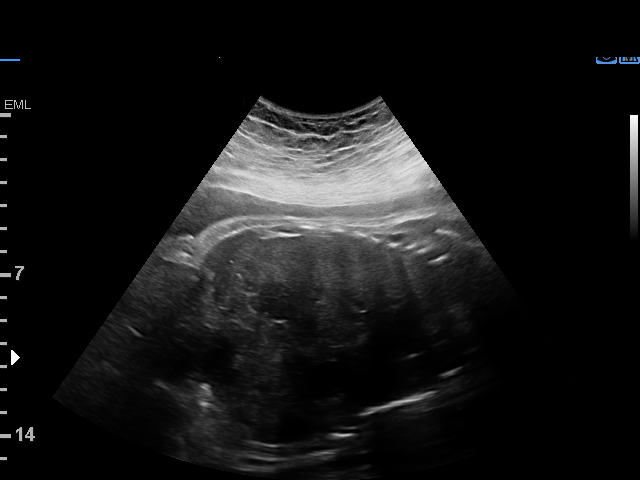

[15 of 25 positions shown; findings below may reference images not displayed]

W/NONSTRESS
 ----------------------------------------------------------------------

 ----------------------------------------------------------------------
Indications

  37 weeks gestation of pregnancy
  Pre-existing diabetes, type 2, in pregnancy,
  third trimester (Insulin)
  Tobacco use complicating pregnancy, third
  trimester
  Poor obstetrical history (Hx drug abuse more
  than 2 years ago)
  Obesity complicating pregnancy, third
  trimester (BMI 45)
 ----------------------------------------------------------------------
Vital Signs

 BMI:
Fetal Evaluation

 Num Of Fetuses:          1
 Fetal Heart Rate(bpm):   135
 Cardiac Activity:        Observed
 Presentation:            Cephalic
 Placenta:                Anterior
 P. Cord Insertion:       Visualized, central

 Amniotic Fluid
 AFI FV:      Within normal limits

 AFI Sum(cm)     %Tile       Largest Pocket(cm)
 7.21            4

 RUQ(cm)       RLQ(cm)       LUQ(cm)        LLQ(cm)
 2.08          2.18          2.95           0
Biophysical Evaluation

 Amniotic F.V:   Within normal limits       F. Tone:         Observed
 F. Movement:    Observed                   Score:           [DATE]
 F. Breathing:   Observed
OB History

 Gravidity:    1
Gestational Age

 LMP:           37w 2d        Date:  11/22/17                 EDD:   08/29/18
 Best:          37w 2d     Det. By:  LMP  (11/22/17)          EDD:   08/29/18
Anatomy

 Diaphragm:             Appears normal         Kidneys:                Appear normal
 Stomach:               Appears normal, left   Bladder:                Appears normal
                        sided
Impression

 Biophysical profile [DATE]
 T2DM
Recommendations

 Continue weekly testing.
 Consider delivery between 37-39 weeks.

## 2021-06-20 NOTE — Progress Notes (Signed)
Subjective:  ?Crystal Castillo is a 31 y.o. G2P1001 at 8w4dbeing seen today for ongoing prenatal care.  She is currently monitored for the following issues for this high-risk pregnancy and has Major depressive disorder, recurrent severe without psychotic features ([place]; Borderline personality disorder (HE[place]; Maternal morbid obesity, antepartum (HR[place]; Pre-existing insulin treated diabetes mellitus during pregnancy (HL[place]; Drug abuse (HM[place]; Type 2 diabetes mellitus without complication ([place]; Supervision of high risk pregnancy, antepartum; Abnormal TSH; Abnormal LFTs; Hepatitis C infection; Dichorionic diamniotic twin pregnancy; Abnormal genetic test during pregnancy; Cirrhosis (HH[place]; and Vaccine counseling on their problem list. ? ?Patient reports general discomforts of pregnancy and seasonal allergies.  Contractions: Not present. Vag. Bleeding: None.  Movement: Present. Denies leaking of fluid.  ? ?The following portions of the patient's history were reviewed and updated as appropriate: allergies, current medications, past family history, past medical history, past social history, past surgical history and problem list. Problem list updated. ? ?Objective:  ? ?Vitals:  ? 06/19/21 1135  ?BP: 108/72  ?Pulse: 79  ?Weight: 291 lb 12.8 oz (132.4 kg)  ? ? ?Fetal Status: Fetal Heart Rate (bpm): 157/160   Movement: Present    ? ?General:  Alert, oriented and cooperative. Patient is in no acute distress.  ?Skin: Skin is warm and dry. No rash noted.   ?Cardiovascular: Normal heart rate noted  ?Respiratory: Normal respiratory effort, no problems with respiration noted  ?Abdomen: Soft, gravid, appropriate for gestational age. Pain/Pressure: Present     ?Pelvic:  Cervical exam deferred        ?Extremities: Normal range of motion.     ?Mental Status: Normal mood and affect. Normal behavior. Normal judgment and thought content.  ? ?Urinalysis:     ? ?Assessment and Plan:  ?Pregnancy: G2P1001 at 154w4d ?1. Supervision of high risk  pregnancy, antepartum ?Stable ?OTC meds for allergies reviewed ? ?2. Dichorionic diamniotic twin pregnancy in second trimester ?Serial growth scans and antenatal testing as per protocol ? ?3. Abnormal genetic test during pregnancy ?S/P genetic counseling and normal anatomy scan ? ?4. Pre-existing insulin treated diabetes mellitus during pregnancy (HCYachats?Just started Dexacom. Has f/u with AnLevada Dyor adjustments as indicated ? ?5. Hepatitis C virus infection without hepatic coma, unspecified chronicity ?Has seen ID. ?F/U pp ?- Comp Met (CMET) ? ?Preterm labor symptoms and general obstetric precautions including but not limited to vaginal bleeding, contractions, leaking of fluid and fetal movement were reviewed in detail with the patient. ?Please refer to After Visit Summary for other counseling recommendations.  ?Return in about 4 weeks (around 07/17/2021) for OB visit, face to face, MD only. ? ? ?ErChancy MilroyMD ?

## 2021-06-24 ENCOUNTER — Ambulatory Visit: Payer: Medicaid Other | Admitting: Registered"

## 2021-06-24 ENCOUNTER — Encounter: Payer: Medicaid Other | Attending: Family Medicine | Admitting: Registered"

## 2021-06-24 DIAGNOSIS — Z794 Long term (current) use of insulin: Secondary | ICD-10-CM

## 2021-06-24 DIAGNOSIS — O24311 Unspecified pre-existing diabetes mellitus in pregnancy, first trimester: Secondary | ICD-10-CM | POA: Diagnosis present

## 2021-06-24 DIAGNOSIS — Z3A09 9 weeks gestation of pregnancy: Secondary | ICD-10-CM | POA: Insufficient documentation

## 2021-06-24 DIAGNOSIS — Z713 Dietary counseling and surveillance: Secondary | ICD-10-CM | POA: Insufficient documentation

## 2021-06-24 DIAGNOSIS — O24111 Pre-existing diabetes mellitus, type 2, in pregnancy, first trimester: Secondary | ICD-10-CM | POA: Insufficient documentation

## 2021-06-24 NOTE — Progress Notes (Signed)
Patient was seen on  for follow-up assessment and education for Gestational Diabetes. EDD 11/10/21.  ? ?Patient is using Dexcom and Omnipod. Patient is using manual mode and calculating bolus, entering in # of units instead of grams of carbs. Most days looks well controlled. Occasionally will have numbers go 150 or more, but not that often. ? ?Nothing over 140 mg/dL in this sample day. ? ? ? ? ?The following learning objectives reviewed during follow-up visit: ? ?Hormonal changes in 3rd trimester which will likely result in needing to change omnipod settings ?Manual mode vs automated mode (not a lot of detail provided at this time) ? ?Plan:  ?Continue estimating carbs as you are and calculating your bolus insulin ?Return for follow-up visit in 4-5 weeks so we can reevaluate switching to automated mode. ? ? ?Patient instructed to monitor glucose levels: ?FBS: 60 - 95 mg/dl ?2 hour: <120 mg/dl ? ?Patient received the following handouts: ?none ? ?Patient will be seen for follow-up in 4 weeks or as needed.  ?

## 2021-06-27 ENCOUNTER — Encounter: Payer: Self-pay | Admitting: Registered"

## 2021-07-01 ENCOUNTER — Ambulatory Visit: Payer: Medicaid Other | Admitting: Physical Therapy

## 2021-07-07 ENCOUNTER — Ambulatory Visit: Payer: Medicaid Other | Admitting: Physical Therapy

## 2021-07-15 ENCOUNTER — Other Ambulatory Visit: Payer: Self-pay

## 2021-07-15 MED ORDER — INSULIN ASPART 100 UNIT/ML IJ SOLN
INTRAMUSCULAR | 8 refills | Status: DC
Start: 2021-07-15 — End: 2021-10-25

## 2021-07-15 MED ORDER — OMNIPOD DASH PODS (GEN 4) MISC: 1.0000 | 8 refills | Status: DC

## 2021-07-15 NOTE — Progress Notes (Signed)
Per Levada Dy, Changed Rx of Omnipods to change every 48 hours and the Novolog to 100 units TTD.  Shelda Pal

## 2021-07-21 ENCOUNTER — Other Ambulatory Visit: Payer: Self-pay | Admitting: *Deleted

## 2021-07-21 ENCOUNTER — Ambulatory Visit: Payer: Medicaid Other | Attending: Obstetrics and Gynecology

## 2021-07-21 ENCOUNTER — Encounter: Payer: Self-pay | Admitting: *Deleted

## 2021-07-21 ENCOUNTER — Ambulatory Visit: Payer: Medicaid Other | Admitting: *Deleted

## 2021-07-21 ENCOUNTER — Ambulatory Visit (INDEPENDENT_AMBULATORY_CARE_PROVIDER_SITE_OTHER): Payer: Medicaid Other | Admitting: Obstetrics & Gynecology

## 2021-07-21 VITALS — BP 118/54 | HR 76 | Wt 285.9 lb

## 2021-07-21 VITALS — BP 108/61 | HR 79

## 2021-07-21 DIAGNOSIS — Z794 Long term (current) use of insulin: Secondary | ICD-10-CM

## 2021-07-21 DIAGNOSIS — O24112 Pre-existing diabetes mellitus, type 2, in pregnancy, second trimester: Secondary | ICD-10-CM | POA: Insufficient documentation

## 2021-07-21 DIAGNOSIS — O30042 Twin pregnancy, dichorionic/diamniotic, second trimester: Secondary | ICD-10-CM

## 2021-07-21 DIAGNOSIS — B1920 Unspecified viral hepatitis C without hepatic coma: Secondary | ICD-10-CM

## 2021-07-21 DIAGNOSIS — O24319 Unspecified pre-existing diabetes mellitus in pregnancy, unspecified trimester: Secondary | ICD-10-CM

## 2021-07-21 DIAGNOSIS — B182 Chronic viral hepatitis C: Secondary | ICD-10-CM | POA: Diagnosis not present

## 2021-07-21 DIAGNOSIS — O98412 Viral hepatitis complicating pregnancy, second trimester: Secondary | ICD-10-CM | POA: Diagnosis not present

## 2021-07-21 DIAGNOSIS — Z6841 Body Mass Index (BMI) 40.0 and over, adult: Secondary | ICD-10-CM | POA: Diagnosis present

## 2021-07-21 DIAGNOSIS — Z3A24 24 weeks gestation of pregnancy: Secondary | ICD-10-CM

## 2021-07-21 DIAGNOSIS — O280 Abnormal hematological finding on antenatal screening of mother: Secondary | ICD-10-CM

## 2021-07-21 DIAGNOSIS — O30002 Twin pregnancy, unspecified number of placenta and unspecified number of amniotic sacs, second trimester: Secondary | ICD-10-CM | POA: Diagnosis present

## 2021-07-21 DIAGNOSIS — O09899 Supervision of other high risk pregnancies, unspecified trimester: Secondary | ICD-10-CM | POA: Insufficient documentation

## 2021-07-21 DIAGNOSIS — E669 Obesity, unspecified: Secondary | ICD-10-CM

## 2021-07-21 DIAGNOSIS — O285 Abnormal chromosomal and genetic finding on antenatal screening of mother: Secondary | ICD-10-CM

## 2021-07-21 DIAGNOSIS — Z0289 Encounter for other administrative examinations: Secondary | ICD-10-CM

## 2021-07-21 DIAGNOSIS — O99212 Obesity complicating pregnancy, second trimester: Secondary | ICD-10-CM | POA: Diagnosis present

## 2021-07-21 DIAGNOSIS — O0990 Supervision of high risk pregnancy, unspecified, unspecified trimester: Secondary | ICD-10-CM

## 2021-07-21 NOTE — Progress Notes (Signed)
   PRENATAL VISIT NOTE  Subjective:  Crystal Castillo is a 31 y.o. G2P1001 at [redacted]w[redacted]d being seen today for ongoing prenatal care.  She is currently monitored for the following issues for this high-risk pregnancy and has Major depressive disorder, recurrent severe without psychotic features ([place]); Borderline personality disorder ([place]); Maternal morbid obesity, antepartum ([place]); Pre-existing insulin treated diabetes mellitus during pregnancy ([place]); Drug abuse ([place]); Type 2 diabetes mellitus without complication ([place]); Supervision of high risk pregnancy, antepartum; Abnormal TSH; Abnormal LFTs; Hepatitis C infection; Dichorionic diamniotic twin pregnancy; Abnormal genetic test during pregnancy; Cirrhosis ([place]); and Vaccine counseling on their problem list.  Patient reports no complaints. Endorses good fetal movement. No vaginal bleeding. No contractions. Denies leaking of fluid. No concerns at this time. Blood sugars have been well controlled, she did have to adjust her insulin slightly but has done well.   The following portions of the patient's history were reviewed and updated as appropriate: allergies, current medications, past family history, past medical history, past social history, past surgical history and problem list.   Objective:   Vitals:   07/21/21 1105  BP: (!) 118/54  Pulse: 76  Weight: 285 lb 14.4 oz (129.7 kg)    Fetal Status: Fetal Heart Rate (bpm): 162/149   Movement: Present     General:  Alert, oriented and cooperative. Patient is in no acute distress.  Skin: Skin is warm and dry. No rash noted.   Cardiovascular: Normal heart rate noted  Respiratory: Normal respiratory effort, no problems with respiration noted  Abdomen: Soft, gravid, appropriate for gestational age.  Pain/Pressure: Absent     Pelvic: Cervical exam deferred        Extremities: Normal range of motion.     Mental Status: Normal mood and affect. Normal behavior. Normal judgment and thought content.    Assessment and Plan:  Pregnancy: G2P1001 at [redacted]w[redacted]d  Supervision of high risk pregnancy, antepartum  Dichorionic diamniotic twin pregnancy in second trimester  Abnormal genetic test during pregnancy  Pre-existing insulin treated diabetes mellitus during pregnancy ([place])  Hepatitis C virus infection without hepatic coma, unspecified chronicity   Preterm labor symptoms and general obstetric precautions including but not limited to vaginal bleeding, contractions, leaking of fluid and fetal movement were reviewed in detail with the patient. Please refer to After Visit Summary for other counseling recommendations.   Return in about 4 weeks (around 08/18/2021).  Future Appointments  Date Time Provider [place]  07/21/2021 12:30 PM [facility] NURSE [facility]  07/21/2021 12:45 PM [facility] US5 [facility]  07/22/2021  3:15 PM [facility]EDUCATION [facility]  07/29/2021 11:45 AM Desenglau, Tommy Rainwater, PT [facility] None  08/25/2021  8:15 AM Aletha Halim, MD [facility]  08/25/2021  8:50 AM [facility] LAB [facility]  11/21/2021  3:00 PM [facility] NURSE [facility]    Lavone Neri, Student-PA 07/21/21 11:30 AM

## 2021-07-21 NOTE — Progress Notes (Signed)
Patient stated that she is feeling good and is aware of ultrasound appointment this afternoon

## 2021-07-22 ENCOUNTER — Encounter: Payer: Medicaid Other | Attending: Family Medicine | Admitting: Registered"

## 2021-07-22 ENCOUNTER — Other Ambulatory Visit: Payer: Self-pay

## 2021-07-22 ENCOUNTER — Ambulatory Visit (INDEPENDENT_AMBULATORY_CARE_PROVIDER_SITE_OTHER): Payer: Medicaid Other | Admitting: Registered"

## 2021-07-22 DIAGNOSIS — Z794 Long term (current) use of insulin: Secondary | ICD-10-CM | POA: Diagnosis not present

## 2021-07-22 DIAGNOSIS — Z713 Dietary counseling and surveillance: Secondary | ICD-10-CM | POA: Diagnosis not present

## 2021-07-22 DIAGNOSIS — Z3A24 24 weeks gestation of pregnancy: Secondary | ICD-10-CM | POA: Diagnosis not present

## 2021-07-22 DIAGNOSIS — O24312 Unspecified pre-existing diabetes mellitus in pregnancy, second trimester: Secondary | ICD-10-CM | POA: Diagnosis not present

## 2021-07-22 DIAGNOSIS — O24319 Unspecified pre-existing diabetes mellitus in pregnancy, unspecified trimester: Secondary | ICD-10-CM

## 2021-07-22 NOTE — Patient Instructions (Signed)
Goals: Continue counting carbs and taking bolus 20 min before eating Consider watching the amount of carbs, aiming for less than 60 grams Consider having a more protein based snack in the evening before bed to help keep your overnight blood sugar lower. I will communicate with the clinical staff about getting a Novolog flexpen refill sent to your pharmacy.

## 2021-07-22 NOTE — Progress Notes (Signed)
Patient was seen on 07/22/21 for follow-up assessment and education for T2D in pregnancy.  Start: 1515  End 1546  EDD 11/10/2021; [redacted]w[redacted]d  Omnipod manual mode  Basal: 12a-12a 1.65 u/hr = 39.6 u Bolus: meals based on carb intake, snacks 1-3 units= 35-40 TDD I:C = 8  Pt reports using an average of 75-80 total units per day  Pt may need to supplement insulin as she reaches close to 100 units TDD which would require pod change more frequent that every 48 hrs. Pt is entering 3rd trimester and has a twin pregnancy which both increase insulin requirements.   Dexcom CGM data looks like pretty good glucose control mostly staying within 60-140 mg/dL. Some improvement with overnight values could be seen with a more protein type snack at night.    The following learning objectives reviewed during follow-up visit:  Tweaks that may help delay need for supplemental insulin  Plan:  Continue counting carbs and taking bolus 20 min before eating Consider watching the amount of carbs, aiming for less than 60 grams per meal Consider having a more protein based snack in the evening before bed to help keep your overnight blood sugar lower. I will communicate with the clinical staff about getting a Novolog flexpen refill sent to your pharmacy.   Patient instructed to monitor glucose levels: FBS: 60 - 95 mg/dl 2 hour: <209 mg/dl  Patient received the following handouts:   Patient will be seen for follow-up as needed.

## 2021-07-22 NOTE — Progress Notes (Signed)
Breast pump RX faxed

## 2021-07-25 ENCOUNTER — Encounter: Payer: Self-pay | Admitting: Radiology
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
## 2021-07-29 ENCOUNTER — Ambulatory Visit: Payer: Medicaid Other | Attending: Obstetrics and Gynecology | Admitting: Physical Therapy

## 2021-07-29 ENCOUNTER — Telehealth: Payer: Self-pay

## 2021-07-29 DIAGNOSIS — R102 Pelvic and perineal pain: Secondary | ICD-10-CM | POA: Insufficient documentation

## 2021-07-29 DIAGNOSIS — M62838 Other muscle spasm: Secondary | ICD-10-CM | POA: Diagnosis present

## 2021-07-29 DIAGNOSIS — M6281 Muscle weakness (generalized): Secondary | ICD-10-CM | POA: Insufficient documentation

## 2021-07-29 NOTE — Telephone Encounter (Signed)
Patient called and left VM requesting help with medical leave paperwork. States she is hoping to have 6 months of medical leave. States this will not all be paid. Also states she was unable to pick up Novolog pens from pharmacy. Pharmacy told pt these were never sent.

## 2021-07-30 ENCOUNTER — Telehealth: Payer: Self-pay | Admitting: General Practice

## 2021-07-30 NOTE — Telephone Encounter (Signed)
Called patient, no answer- left message to call us back if you still need assistance. Per pharmacy, patient picked up novolog vial last night.

## 2021-07-30 NOTE — Telephone Encounter (Signed)
Patient called and left message stating she is returning our call.  Called patient and she states she was calling regarding her FMLA papers and her insulin. Patient states the disability company told her something was missing so they faxed back papers today. Told patient someone from our office will receive those and make any corrections needed. Patient states she saw Marylene Land last week and Marylene Land said she was near reaching her limit on her daily insulin for her pump and may need additional insulin through a pen. Patient states she was supposed to reach out to someone but she hasn't heard anything back or received something from the the pharmacy. Told patient I cannot see anything either but Marylene Land is back in the office tomorrow and I will speak with her then. Patient verbalized understanding and states she has a chest cold with coughing and wants to know if she can take mucinex for that. Told patient she can or robitussin. Patient asked if those were okay for her liver given her cirrhosis. Told patient I believe so but I wasn't sure. Recommended she reach out to her ID doctor. Told patient I sent a mychart message with a list of meds that are safe for pregnancy. Patient verbalized understanding to all.

## 2021-07-31 ENCOUNTER — Other Ambulatory Visit: Payer: Self-pay | Admitting: Family Medicine

## 2021-07-31 DIAGNOSIS — E119 Type 2 diabetes mellitus without complications: Secondary | ICD-10-CM

## 2021-07-31 MED ORDER — NOVOLOG PENFILL 100 UNIT/ML ~~LOC~~ SOCT: 8.0000 [IU] | Freq: Three times a day (TID) | SUBCUTANEOUS | 11 refills | Status: DC

## 2021-07-31 NOTE — Progress Notes (Signed)
Omnipod avg 75-80 total units per day  manual mode settings:  Basal: 12a-12a 1.65 u/hr = 39.6 u  Bolus: meals based on carb intake, snacks 1-3 units= 35-40 TDD 6/6   note: Novolog Pen supplement written 6/15--8 units TID with meals, allow for additional 2-4u based on carbs

## 2021-08-01 ENCOUNTER — Encounter: Payer: Self-pay | Admitting: Radiology

## 2021-08-14 ENCOUNTER — Ambulatory Visit: Payer: Medicaid Other | Admitting: Physical Therapy

## 2021-08-14 ENCOUNTER — Encounter: Payer: Self-pay | Admitting: Physical Therapy

## 2021-08-14 DIAGNOSIS — M6281 Muscle weakness (generalized): Secondary | ICD-10-CM | POA: Diagnosis not present

## 2021-08-14 DIAGNOSIS — M62838 Other muscle spasm: Secondary | ICD-10-CM

## 2021-08-14 NOTE — Therapy (Signed)
OUTPATIENT PHYSICAL THERAPY TREATMENT NOTE   Patient Name: Crystal Castillo MRN: [id number] DOB:08-08-1990, 31 y.o., female Today's Date: 08/14/2021  PCP: Eartha Inch, MD   REFERRING PROVIDER:  Catalina Antigua, MD    END OF SESSION:   PT End of Session - 08/14/21 1539     Visit Number 2    Date for PT Re-Evaluation 10/21/21    Authorization Type [place] [employer]    PT Start Time 1535    PT Stop Time 1613    PT Time Calculation (min) 38 min    Activity Tolerance Patient tolerated treatment well    Behavior During Therapy [facility] for tasks assessed/performed             Past Medical History:  Diagnosis Date   Anorexia nervosa with bulimia    just finished treatment in August 2012 for this   Anxiety    Bartholin's cyst    Cirrhosis ([name]) 05/22/2021   Depression    Diabetes mellitus    not currently treated for this. Pt states she was told she had DM, but lost @  150 lbs in 9 months.   Diabetes mellitus without complication ([name])    Hepatitis C    Obesity    PTSD (post-traumatic stress disorder)    Vaccine counseling 05/22/2021   Past Surgical History:  Procedure Laterality Date   TONSILLECTOMY     WISDOM TOOTH EXTRACTION     Patient Active Problem List   Diagnosis Date Noted   Cirrhosis ([name]) 05/22/2021   Vaccine counseling 05/22/2021   Dichorionic diamniotic twin pregnancy 05/01/2021   Abnormal genetic test during pregnancy 05/01/2021   Hepatitis C infection 04/14/2021   Abnormal TSH 04/11/2021   Abnormal LFTs 04/11/2021   Supervision of high risk pregnancy, antepartum 04/10/2021   Type 2 diabetes mellitus without complication ([name]) 09/22/2018   Maternal morbid obesity, antepartum ([name]) 01/20/2018   Pre-existing insulin treated diabetes mellitus during pregnancy ([name]) 01/20/2018   Borderline personality disorder ([name]) 08/19/2016   Major depressive disorder, recurrent severe without psychotic features ([name]) 08/18/2016   Drug abuse ([name]) 05/09/2016     REFERRING DIAG: R10.2 (ICD-10-CM) - Pelvic pain  THERAPY DIAG:  Muscle weakness (generalized)  Other muscle spasm  Rationale for Evaluation and Treatment Rehabilitation  PERTINENT HISTORY: Bartholin's cyst, PTSD  PRECAUTIONS: None  SUBJECTIVE: I have had more low back pain than anything else lately.  I have been going to the pool more.  I haven't been able to make as much time for the exercises I hate to say.  PAIN:  Are you having pain? No   OBJECTIVE: (objective measures completed at initial evaluation unless otherwise dated)   PATIENT SURVEYS:      PFIQ-7 = 43 (POPIQ)   COGNITION:            Overall cognitive status: Within functional limits for tasks assessed                  MUSCLE LENGTH: Hamstrings: Right 80 deg; Left 80 deg Thomas test: Right 10 deg; Left 10 deg   LUMBAR SPECIAL TESTS:  Straight leg raise test: Positive for abdominal weakness  improved with pelvic compression     GAIT:   Comments: WFL for pregnancy wider BOS                POSTURE: increased lumbar lordosis, anterior pelvic tilt, weight shift right, and more lordotic and anteriorly tilted on the left ilium  PELVIC ALIGNMENT:   LUMBARAROM/PROM   A/PROM A/PROM  eval  Flexion    Extension    Right lateral flexion    Left lateral flexion    Right rotation    Left rotation     (Blank rows = not tested)   LOWER EXTREMITY ROM:   passive ROM Right eval Left eval  Hip flexion      Hip extension      Hip abduction      Hip adduction      Hip internal rotation   25% limited  Hip external rotation      Knee flexion      Knee extension      Ankle dorsiflexion      Ankle plantarflexion      Ankle inversion      Ankle eversion       (Blank rows = not tested)   LOWER EXTREMITY MMT:   MMT Right eval Left eval  Hip flexion      Hip extension      Hip abduction 4/5 4/5  Hip adduction      Hip internal rotation      Hip external rotation      Knee flexion       Knee extension      Ankle dorsiflexion      Ankle plantarflexion      Ankle inversion      Ankle eversion         PALPATION:   General  TTP rectus abdominus attachment; lumbar paraspinals tight; gluteals tight; Lt ilium ant rotation                 External Perineal Exam [facility]                             Internal Pelvic Floor 3/5 after one that was 2/5; holding 6 sec x 1 rep   Patient confirms identification and approves PT to assess internal pelvic floor and treatment Yes           TONE: High on the right side   PROLAPSE: None noticed   TODAY'S TREATMENT  Treatment: 08/14/21 Exercises  Cat cow  Pelvic tilt at wall Pelvic tilt supine Thoracic rotation supine Seated thoracic rotation Transversus abdominus activation in qped  Manual Side lying: sacral mobs and distraction, lumbar and thoracic paraspinals        PATIENT EDUCATION:  Education details: Access Code: [email or web address] Person educated: Patient Education method: Explanation, Demonstration, Verbal cues, and Handouts Education comprehension: verbalized understanding and returned demonstration     HOME EXERCISE PROGRAM: Access Code: [email or web address] URL: [email or web address] Date: 08/14/2021 Prepared by: Dwana Curd  Exercises - Supine Butterfly Groin Stretch  - 1 x daily - 7 x weekly - 1 sets - 3 reps - 30 sec hold - Hooklying Single Knee to Chest Stretch  - 1 x daily - 7 x weekly - 1 sets - 5 reps - 20 sec hold - Cat Cow in [place] with Pool Noodle  - 1 x daily - 7 x weekly - 3 sets - 10 reps - Pelvic Tilt at Pool Wall  - 1 x daily - 7 x weekly - 3 sets - 10 reps - Supine Upper Trunk and Cervical Rotation with Opposite Lower Trunk Rotation  - 1 x daily - 7 x weekly - 1 sets - 10 reps - 5 hold - Seated Thoracic Flexion and Rotation with  Arms Crossed  - 1 x daily - 7 x weekly - 1 sets - 10 reps - 5 sec hold   ASSESSMENT:   CLINICAL IMPRESSION: Patient was given flyer for pregnancy  classes.  Pt was having difficulty with stretches due to having to get down on the floor and felt that it was not convenient.  Pt has been going to a pool so given stretches to do in the water.  She was having less pubic symphysis pain but more low back pain lately. Today's session focused on release of the low back and lumbar and thoracic mobility.     OBJECTIVE IMPAIRMENTS decreased activity tolerance, decreased coordination, decreased endurance, difficulty walking, decreased ROM, decreased strength, increased muscle spasms, postural dysfunction, and pain.    ACTIVITY LIMITATIONS lifting, sitting, standing, squatting, transfers, and bed mobility   PARTICIPATION LIMITATIONS: driving, community activity, and occupation   PERSONAL FACTORS 1 comorbidity: pregnant with twins and 1-2 comorbidities: 2nd deg vaginal tear in 2020 vaginal delivery  are also affecting patient's functional outcome.    REHAB POTENTIAL: Excellent   CLINICAL DECISION MAKING: Evolving/moderate complexity   EVALUATION COMPLEXITY: Moderate     GOALS: Goals reviewed with patient? Yes   SHORT TERM GOALS: Target date: 08/26/2021   Ind with initial HEP Baseline: Goal status: Ongoing         LONG TERM GOALS: Target date: 10/21/2021    Pt will report at least 50% less pain Baseline:  Goal status: INITIAL   2.  Pt will be able to walk for 30 minutes or more as part of healthy pregnancy Baseline: < 30 minutes and has too much pain Goal status: INITIAL   3.  Pt will be independent with advanced HEP to maintain improvements made throughout therapy   Baseline:  Goal status: INITIAL   4.  Pt will be able to tolerate a 1 hour drive without increased pain Baseline: pain increases to 8/10 Goal status: INITIAL       PLAN: PT FREQUENCY: 1x/week   PT DURATION: 1 week   PLANNED INTERVENTIONS: Therapeutic exercises, Therapeutic activity, Neuromuscular re-education, Balance training, Gait training, Patient/Family  education, Joint mobilization, Aquatic Therapy, Electrical stimulation, Cryotherapy, Moist heat, Taping, Biofeedback, Manual therapy, and Re-evaluation   PLAN FOR NEXT SESSION: core strength,  STM/fascial release to left lumbar and gluteals Rt upper back, basic core transversus abdominus activation progression, SB and rotation stretches    [employer], PT 08/14/2021, 4:20 PM

## 2021-08-18 ENCOUNTER — Ambulatory Visit: Payer: Medicaid Other | Admitting: *Deleted

## 2021-08-18 ENCOUNTER — Encounter: Payer: Self-pay | Admitting: *Deleted

## 2021-08-18 ENCOUNTER — Other Ambulatory Visit: Payer: Self-pay | Admitting: *Deleted

## 2021-08-18 ENCOUNTER — Ambulatory Visit: Payer: Medicaid Other | Attending: Obstetrics

## 2021-08-18 VITALS — BP 109/73 | HR 94

## 2021-08-18 DIAGNOSIS — O30043 Twin pregnancy, dichorionic/diamniotic, third trimester: Secondary | ICD-10-CM

## 2021-08-18 DIAGNOSIS — B182 Chronic viral hepatitis C: Secondary | ICD-10-CM

## 2021-08-18 DIAGNOSIS — R772 Abnormality of alphafetoprotein: Secondary | ICD-10-CM

## 2021-08-18 DIAGNOSIS — O24112 Pre-existing diabetes mellitus, type 2, in pregnancy, second trimester: Secondary | ICD-10-CM | POA: Diagnosis present

## 2021-08-18 DIAGNOSIS — O280 Abnormal hematological finding on antenatal screening of mother: Secondary | ICD-10-CM | POA: Diagnosis not present

## 2021-08-18 DIAGNOSIS — O30042 Twin pregnancy, dichorionic/diamniotic, second trimester: Secondary | ICD-10-CM | POA: Insufficient documentation

## 2021-08-18 DIAGNOSIS — Z3A28 28 weeks gestation of pregnancy: Secondary | ICD-10-CM

## 2021-08-18 DIAGNOSIS — E669 Obesity, unspecified: Secondary | ICD-10-CM

## 2021-08-18 DIAGNOSIS — Z6841 Body Mass Index (BMI) 40.0 and over, adult: Secondary | ICD-10-CM

## 2021-08-18 DIAGNOSIS — O24113 Pre-existing diabetes mellitus, type 2, in pregnancy, third trimester: Secondary | ICD-10-CM | POA: Diagnosis not present

## 2021-08-18 DIAGNOSIS — O99213 Obesity complicating pregnancy, third trimester: Secondary | ICD-10-CM

## 2021-08-18 DIAGNOSIS — O98413 Viral hepatitis complicating pregnancy, third trimester: Secondary | ICD-10-CM

## 2021-08-18 NOTE — Telephone Encounter (Signed)
Can someone look into her Rx for the pens. She said she only got the cartridges, but needs the pen. I'm not familiar with how to order these so I'm not much help

## 2021-08-21 ENCOUNTER — Other Ambulatory Visit: Payer: Self-pay | Admitting: *Deleted

## 2021-08-21 DIAGNOSIS — B182 Chronic viral hepatitis C: Secondary | ICD-10-CM

## 2021-08-21 DIAGNOSIS — R772 Abnormality of alphafetoprotein: Secondary | ICD-10-CM

## 2021-08-21 DIAGNOSIS — O24113 Pre-existing diabetes mellitus, type 2, in pregnancy, third trimester: Secondary | ICD-10-CM

## 2021-08-21 DIAGNOSIS — Z6841 Body Mass Index (BMI) 40.0 and over, adult: Secondary | ICD-10-CM

## 2021-08-21 DIAGNOSIS — O30043 Twin pregnancy, dichorionic/diamniotic, third trimester: Secondary | ICD-10-CM

## 2021-08-22 ENCOUNTER — Ambulatory Visit: Payer: Medicaid Other | Admitting: Physical Therapy

## 2021-08-25 ENCOUNTER — Telehealth (INDEPENDENT_AMBULATORY_CARE_PROVIDER_SITE_OTHER): Payer: Medicaid Other | Admitting: Obstetrics and Gynecology

## 2021-08-25 ENCOUNTER — Other Ambulatory Visit: Payer: Medicaid Other

## 2021-08-25 VITALS — BP 98/56 | HR 80

## 2021-08-25 DIAGNOSIS — B182 Chronic viral hepatitis C: Secondary | ICD-10-CM | POA: Diagnosis not present

## 2021-08-25 DIAGNOSIS — Z3A29 29 weeks gestation of pregnancy: Secondary | ICD-10-CM | POA: Diagnosis not present

## 2021-08-25 DIAGNOSIS — O0993 Supervision of high risk pregnancy, unspecified, third trimester: Secondary | ICD-10-CM

## 2021-08-25 DIAGNOSIS — O99213 Obesity complicating pregnancy, third trimester: Secondary | ICD-10-CM

## 2021-08-25 DIAGNOSIS — O24319 Unspecified pre-existing diabetes mellitus in pregnancy, unspecified trimester: Secondary | ICD-10-CM

## 2021-08-25 DIAGNOSIS — R7989 Other specified abnormal findings of blood chemistry: Secondary | ICD-10-CM

## 2021-08-25 DIAGNOSIS — O30043 Twin pregnancy, dichorionic/diamniotic, third trimester: Secondary | ICD-10-CM

## 2021-08-25 DIAGNOSIS — O0990 Supervision of high risk pregnancy, unspecified, unspecified trimester: Secondary | ICD-10-CM

## 2021-08-25 DIAGNOSIS — O98413 Viral hepatitis complicating pregnancy, third trimester: Secondary | ICD-10-CM

## 2021-08-25 DIAGNOSIS — O285 Abnormal chromosomal and genetic finding on antenatal screening of mother: Secondary | ICD-10-CM

## 2021-08-25 DIAGNOSIS — E119 Type 2 diabetes mellitus without complications: Secondary | ICD-10-CM

## 2021-08-25 DIAGNOSIS — Z794 Long term (current) use of insulin: Secondary | ICD-10-CM

## 2021-08-25 DIAGNOSIS — Z6841 Body Mass Index (BMI) 40.0 and over, adult: Secondary | ICD-10-CM

## 2021-08-25 DIAGNOSIS — O99210 Obesity complicating pregnancy, unspecified trimester: Secondary | ICD-10-CM

## 2021-08-25 MED ORDER — INSULIN PEN NEEDLE 31G X 5 MM MISC: 1.0000 | Freq: Four times a day (QID) | 5 refills | Status: AC

## 2021-08-25 NOTE — Addendum Note (Signed)
Addended by: Cline Crock on: 08/25/2021 09:02 AM   Modules accepted: Orders

## 2021-08-25 NOTE — Progress Notes (Addendum)
TELEHEALTH OBSTETRICS VISIT ENCOUNTER NOTE  Provider location: Center for [facility]   Patient location: Home  I connected with Crystal Castillo on 08/25/21 at  8:15 AM EDT by telephone at home and verified that I am speaking with the correct person using two identifiers. Of note, unable to do video encounter due to technical difficulties.    I discussed the limitations, risks, security and privacy concerns of performing an evaluation and management service by telephone and the availability of in person appointments. I also discussed with the patient that there may be a patient responsible charge related to this service. The patient expressed understanding and agreed to proceed.  Subjective:  Crystal Castillo is a 31 y.o. G2P1001 at [redacted]w[redacted]d being followed for ongoing prenatal care.  She is currently monitored for the following issues for this high-risk pregnancy and has Major depressive disorder, recurrent severe without psychotic features ([name]); Borderline personality disorder ([name]); Maternal morbid obesity, antepartum ([name]); Pre-existing insulin treated diabetes mellitus during pregnancy ([name]); Drug abuse ([name]); Type 2 diabetes mellitus without complication ([name]); Supervision of high risk pregnancy, antepartum; Hepatitis C infection; Dichorionic diamniotic twin pregnancy; Abnormal genetic test during pregnancy; Cirrhosis ([name]); and Vaccine counseling on their problem list.  Patient reports no complaints. Reports fetal movement. Denies any contractions, bleeding or leaking of fluid.   The following portions of the patient's history were reviewed and updated as appropriate: allergies, current medications, past family history, past medical history, past social history, past surgical history and problem list.   Objective:  Blood pressure (!) 98/56, pulse 80, last menstrual period 02/03/2021, unknown if currently breastfeeding. General:  Alert, oriented and cooperative.    Mental Status: Normal mood and affect perceived. Normal judgment and thought content.  Rest of physical exam deferred due to type of encounter  Assessment and Plan:  Pregnancy: G2P1001 at [redacted]w[redacted]d 1. Supervision of high risk pregnancy, antepartum Ask about birth control next visit I told her she can make an appt anytime for lab only visit - CBC; Future - RPR; Future - HIV antibody (with reflex); Future - TSH Rfx on Abnormal to Free T4; Future - Comprehensive metabolic panel; Future  2. [redacted] weeks gestation of pregnancy - CBC; Future - RPR; Future - HIV antibody (with reflex); Future - TSH Rfx on Abnormal to Free T4; Future - Comprehensive metabolic panel; Future  3. 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 Pre-existing insulin treated diabetes mellitus during pregnancy ([name]) Patient on omnipod and PRN flexpen and she is on dexcom. She uses the flexpen b/c she is only allowed a new pod q48hrs and uses the pen with carb counting AM fastings 80s-mid 90s and 1 hour post prandials in the 140s.  RN to help pt with supply refill  5. Maternal morbid obesity, antepartum ([name])  6. BMI 45.0-49.9, adult ([name])  7. Dichorionic diamniotic twin pregnancy in third trimester Fetal echo wnl x 2 Start qwk testing at 32wks Follow up at later scans re: delivery timing with MFM guidance.  7/3 -34%, 1147gm, ac 31%, afi 6.3 72%, 1298gm, ac 67%, afi 4.18, discordance 12% I told her that we really don't start being concerned about presentation until about 34wks  8. Abnormal genetic test during pregnancy Afp high risk. No change in prenatal care  9. Abnormal LFTs Follow up with repeat labs    Latest Ref Rng & Units 06/19/2021  11:44 AM 04/24/2021    2:32 PM 04/10/2021    4:03 PM  CMP  Glucose 70 - 99 mg/dL 79   80   BUN 6 - 20 mg/dL 7   9   Creatinine 4.23 - 1.00 mg/dL 5.36   1.44   Sodium 315 - 144 mmol/L 137   140    Potassium 3.5 - 5.2 mmol/L 4.1   4.1   Chloride 96 - 106 mmol/L 104   104   CO2 20 - 29 mmol/L 19   20   Calcium 8.7 - 10.2 mg/dL 8.9   9.1   Total Protein 6.0 - 8.5 g/dL 5.9   6.5   Total Bilirubin 0.0 - 1.2 mg/dL 0.2   0.2   Alkaline Phos 44 - 121 IU/L 51   46   AST 0 - 40 IU/L 20   21   ALT 0 - 32 IU/L 21  31  39    10. Abnormal TSH On no meds. Follow up repeat surveillance labs  Preterm labor symptoms and general obstetric precautions including but not limited to vaginal bleeding, contractions, leaking of fluid and fetal movement were reviewed in detail with the patient. Please refer to After Visit Summary for other counseling recommendations.   Return in about 2 weeks (around 09/08/2021) for in person, high risk ob, md visit.  Future Appointments  Date Time Provider Department Center  08/26/2021  2:00 PM Desenglau, Shireen Quan, PT [facility] None  09/09/2021  2:45 PM Desenglau, Shireen Quan, PT [facility] None  09/16/2021  2:45 PM Desenglau, Shireen Quan, PT [facility] None  09/17/2021  1:30 PM [facility] NURSE [facility]  09/17/2021  1:45 PM [facility] US6 [facility]  09/23/2021  2:45 PM Desenglau, Shireen Quan, PT [facility] None  09/24/2021 11:15 AM [facility] NURSE [facility]  09/24/2021  1:30 PM [facility] US2 [facility]  10/01/2021  1:15 PM [facility] NURSE [facility]  10/01/2021  1:30 PM [facility] US2 [facility]  11/21/2021  3:00 PM [facility] NURSE [facility]   Preterm labor symptoms and general obstetric precautions including but not limited to vaginal bleeding, contractions, leaking of fluid and fetal movement were reviewed in detail with the patient.  I discussed the assessment and treatment plan with the patient. The patient was provided an opportunity to ask questions and all were answered. The patient agreed with the plan and demonstrated an understanding of the instructions. The patient was advised to call back or seek an in-person office evaluation/go to [facility] at [facility] for any urgent or concerning symptoms. Please refer to After Visit Summary for other counseling recommendations.   I provided 10 minutes of non-face-to-face time during this encounter. [place] Bing, MD Center for [employer], [facility]

## 2021-08-25 NOTE — Therapy (Signed)
OUTPATIENT PHYSICAL THERAPY TREATMENT NOTE   Patient Name: Crystal Castillo MRN: [id number] DOB:[birth year], 31 y.o., female Today's Date: 08/26/2021  PCP: Eartha Inch, MD   REFERRING PROVIDER:  Catalina Antigua, MD    END OF SESSION:   PT End of Session - 08/26/21 1405     Visit Number 3    Number of Visits 4    Date for PT Re-Evaluation 10/21/21    Authorization Type [place] MEDICAID [employer]    PT Start Time 1402    PT Stop Time 1445    PT Time Calculation (min) 43 min    Activity Tolerance Patient tolerated treatment well    Behavior During Therapy [facility] for tasks assessed/performed              Past Medical History:  Diagnosis Date   Anorexia nervosa with bulimia    just finished treatment in August 2012 for this   Anxiety    Bartholin's cyst    Cirrhosis ([name]) 05/22/2021   Depression    Diabetes mellitus    not currently treated for this. Pt states she was told she had DM, but lost @  150 lbs in 9 months.   Diabetes mellitus without complication ([name])    Hepatitis C    Obesity    PTSD (post-traumatic stress disorder)    Vaccine counseling 05/22/2021   Past Surgical History:  Procedure Laterality Date   TONSILLECTOMY     WISDOM TOOTH EXTRACTION     Patient Active Problem List   Diagnosis Date Noted   Cirrhosis ([name]) 05/22/2021   Vaccine counseling 05/22/2021   Dichorionic diamniotic twin pregnancy 05/01/2021   Abnormal genetic test during pregnancy 05/01/2021   Hepatitis C infection 04/14/2021   Abnormal TSH 04/11/2021   Abnormal LFTs 04/11/2021   Supervision of high risk pregnancy, antepartum 04/10/2021   Type 2 diabetes mellitus without complication ([name]) 09/22/2018   Maternal morbid obesity, antepartum ([name]) 01/20/2018   Pre-existing insulin treated diabetes mellitus during pregnancy ([name]) 01/20/2018   Borderline personality disorder ([name]) 08/19/2016   Major depressive disorder, recurrent severe without psychotic features ([name]) 08/18/2016   Drug  abuse ([name]) 05/09/2016    REFERRING DIAG: R10.2 (ICD-10-CM) - Pelvic pain  THERAPY DIAG:  Muscle weakness (generalized)  Other muscle spasm  Rationale for Evaluation and Treatment Rehabilitation  PERTINENT HISTORY: Bartholin's cyst, PTSD  PRECAUTIONS: None  SUBJECTIVE: I have been very busy lately.  I am feeling sore right in the pubic symphysis  PAIN:  Are you having pain? No   OBJECTIVE: (objective measures completed at initial evaluation unless otherwise dated)   PATIENT SURVEYS:      PFIQ-7 = 43 (POPIQ)   COGNITION:            Overall cognitive status: Within functional limits for tasks assessed                  MUSCLE LENGTH: Hamstrings: Right 80 deg; Left 80 deg Thomas test: Right 10 deg; Left 10 deg   LUMBAR SPECIAL TESTS:  Straight leg raise test: Positive for abdominal weakness  improved with pelvic compression     GAIT:   Comments: WFL for pregnancy wider BOS                POSTURE: increased lumbar lordosis, anterior pelvic tilt, weight shift right, and more lordotic and anteriorly tilted on the left ilium               PELVIC ALIGNMENT:  LUMBARAROM/PROM   A/PROM A/PROM  eval  Flexion    Extension    Right lateral flexion    Left lateral flexion    Right rotation    Left rotation     (Blank rows = not tested)   LOWER EXTREMITY ROM:   passive ROM Right eval Left eval  Hip flexion      Hip extension      Hip abduction      Hip adduction      Hip internal rotation   25% limited  Hip external rotation      Knee flexion      Knee extension      Ankle dorsiflexion      Ankle plantarflexion      Ankle inversion      Ankle eversion       (Blank rows = not tested)   LOWER EXTREMITY MMT:   MMT Right eval Left eval  Hip flexion      Hip extension      Hip abduction 4/5 4/5  Hip adduction      Hip internal rotation      Hip external rotation      Knee flexion      Knee extension      Ankle dorsiflexion      Ankle  plantarflexion      Ankle inversion      Ankle eversion         PALPATION:   General  TTP rectus abdominus attachment; lumbar paraspinals tight; gluteals tight; Lt ilium ant rotation                 External Perineal Exam [facility]                             Internal Pelvic Floor 3/5 after one that was 2/5; holding 6 sec x 1 rep   Patient confirms identification and approves PT to assess internal pelvic floor and treatment Yes           TONE: High on the right side   PROLAPSE: None noticed   TODAY'S TREATMENT  Treatment: 08/26/21 Exercises  Cat cow  Rotation in quadruped [place] squeeze adductor Quadruped hip shift Pallof press and rotation with LE on on step  Manual Supine round ligament release and abdominal fascial release  Treatment: 08/14/21 Exercises  Cat cow  Pelvic tilt at wall Pelvic tilt supine Thoracic rotation supine Seated thoracic rotation Transversus abdominus activation in qped  Manual Side lying: sacral mobs and distraction, lumbar and thoracic paraspinals        PATIENT EDUCATION:  Education details: Access Code: [email or web address] added qped rotation, pelvic shift in qped and ball squeeze Person educated: Patient Education method: Explanation, Demonstration, Verbal cues, and Handouts Education comprehension: verbalized understanding and returned demonstration     HOME EXERCISE PROGRAM: Access Code: [email or web address] URL: [email or web address] Date: 08/14/2021 Prepared by: Dwana Curd  Exercises - Supine Butterfly Groin Stretch  - 1 x daily - 7 x weekly - 1 sets - 3 reps - 30 sec hold - Hooklying Single Knee to Chest Stretch  - 1 x daily - 7 x weekly - 1 sets - 5 reps - 20 sec hold - Cat Cow in [place] with Pool Noodle  - 1 x daily - 7 x weekly - 3 sets - 10 reps - Pelvic Tilt at Pool Wall  - 1 x daily - 7 x weekly -  3 sets - 10 reps - Supine Upper Trunk and Cervical Rotation with Opposite Lower Trunk Rotation  - 1 x daily - 7 x  weekly - 1 sets - 10 reps - 5 hold - Seated Thoracic Flexion and Rotation with Arms Crossed  - 1 x daily - 7 x weekly - 1 sets - 10 reps - 5 sec hold   ASSESSMENT:   CLINICAL IMPRESSION: Pt did well with exercises and progression provided for improved pelvic mobility.  Pt had increased back pain with pallof press in standing after 8-10 reps.  She will benefit from skilled PT to continue to address core strength and improved pelvic mobility for healthy pregnancy.     OBJECTIVE IMPAIRMENTS decreased activity tolerance, decreased coordination, decreased endurance, difficulty walking, decreased ROM, decreased strength, increased muscle spasms, postural dysfunction, and pain.    ACTIVITY LIMITATIONS lifting, sitting, standing, squatting, transfers, and bed mobility   PARTICIPATION LIMITATIONS: driving, community activity, and occupation   PERSONAL FACTORS 1 comorbidity: pregnant with twins and 1-2 comorbidities: 2nd deg vaginal tear in 2020 vaginal delivery  are also affecting patient's functional outcome.    REHAB POTENTIAL: Excellent   CLINICAL DECISION MAKING: Evolving/moderate complexity   EVALUATION COMPLEXITY: Moderate     GOALS: Goals reviewed with patient? Yes   SHORT TERM GOALS: Target date: 08/26/2021   Ind with initial HEP Baseline: Goal status: Ongoing         LONG TERM GOALS: Target date: 10/21/2021    Pt will report at least 50% less pain Baseline:  Goal status: INITIAL   2.  Pt will be able to walk for 30 minutes or more as part of healthy pregnancy Baseline: < 30 minutes and has too much pain Goal status: INITIAL   3.  Pt will be independent with advanced HEP to maintain improvements made throughout therapy   Baseline:  Goal status: INITIAL   4.  Pt will be able to tolerate a 1 hour drive without increased pain Baseline: pain increases to 8/10 Goal status: INITIAL       PLAN: PT FREQUENCY: 1x/week   PT DURATION: 1 week   PLANNED INTERVENTIONS:  Therapeutic exercises, Therapeutic activity, Neuromuscular re-education, Balance training, Gait training, Patient/Family education, Joint mobilization, Aquatic Therapy, Electrical stimulation, Cryotherapy, Moist heat, Taping, Biofeedback, Manual therapy, and Re-evaluation   PLAN FOR NEXT SESSION: core strength,  and pelvic moblity continued, nustep or eliptical maybe    [employer], PT 08/26/2021, 2:06 PM

## 2021-08-26 ENCOUNTER — Ambulatory Visit: Payer: Medicaid Other | Attending: Obstetrics and Gynecology | Admitting: Physical Therapy

## 2021-08-26 ENCOUNTER — Encounter: Payer: Self-pay | Admitting: Physical Therapy

## 2021-08-26 DIAGNOSIS — M62838 Other muscle spasm: Secondary | ICD-10-CM | POA: Diagnosis present

## 2021-08-26 DIAGNOSIS — M6281 Muscle weakness (generalized): Secondary | ICD-10-CM | POA: Diagnosis present

## 2021-08-29 ENCOUNTER — Other Ambulatory Visit: Payer: Self-pay

## 2021-08-29 ENCOUNTER — Other Ambulatory Visit: Payer: Medicaid Other

## 2021-08-29 DIAGNOSIS — Z3A29 29 weeks gestation of pregnancy: Secondary | ICD-10-CM

## 2021-08-29 DIAGNOSIS — O0990 Supervision of high risk pregnancy, unspecified, unspecified trimester: Secondary | ICD-10-CM

## 2021-08-30 LAB — COMPREHENSIVE METABOLIC PANEL
ALT: 16 IU/L (ref 0–32)
AST: 18 IU/L (ref 0–40)
Albumin/Globulin Ratio: 1.5 (ref 1.2–2.2)
Albumin: 3.3 g/dL — ABNORMAL LOW (ref 4.0–5.0)
Alkaline Phosphatase: 71 IU/L (ref 44–121)
BUN/Creatinine Ratio: 13 (ref 9–23)
BUN: 6 mg/dL (ref 6–20)
Bilirubin Total: 0.2 mg/dL (ref 0.0–1.2)
CO2: 19 mmol/L — ABNORMAL LOW (ref 20–29)
Calcium: 8.3 mg/dL — ABNORMAL LOW (ref 8.7–10.2)
Chloride: 104 mmol/L (ref 96–106)
Creatinine, Ser: 0.47 mg/dL — ABNORMAL LOW (ref 0.57–1.00)
Globulin, Total: 2.2 g/dL (ref 1.5–4.5)
Glucose: 73 mg/dL (ref 70–99)
Potassium: 4.3 mmol/L (ref 3.5–5.2)
Sodium: 136 mmol/L (ref 134–144)
Total Protein: 5.5 g/dL — ABNORMAL LOW (ref 6.0–8.5)
eGFR: 132 mL/min/{1.73_m2} (ref >59)

## 2021-08-30 LAB — CBC
Hematocrit: 37.3 % (ref 34.0–46.6)
Hemoglobin: 12.6 g/dL (ref 11.1–15.9)
MCH: 31.1 pg (ref 26.6–33.0)
MCHC: 33.8 g/dL (ref 31.5–35.7)
MCV: 92 fL (ref 79–97)
Platelets: 176 10*3/uL (ref 150–450)
RBC: 4.05 x10E6/uL (ref 3.77–5.28)
RDW: 12.4 % (ref 11.7–15.4)
WBC: 10.4 10*3/uL (ref 3.4–10.8)

## 2021-08-30 LAB — HIV ANTIBODY (ROUTINE TESTING W REFLEX): HIV Screen 4th Generation wRfx: NONREACTIVE

## 2021-08-30 LAB — RPR: RPR Ser Ql: NONREACTIVE

## 2021-08-30 LAB — TSH RFX ON ABNORMAL TO FREE T4: TSH: 1.01 u[IU]/mL (ref 0.450–4.500)

## 2021-09-08 ENCOUNTER — Other Ambulatory Visit: Payer: Self-pay

## 2021-09-08 ENCOUNTER — Ambulatory Visit (INDEPENDENT_AMBULATORY_CARE_PROVIDER_SITE_OTHER): Payer: Medicaid Other | Admitting: Obstetrics and Gynecology

## 2021-09-08 ENCOUNTER — Encounter: Payer: Self-pay | Admitting: Obstetrics and Gynecology

## 2021-09-08 VITALS — BP 91/47 | HR 89 | Wt 294.6 lb

## 2021-09-08 DIAGNOSIS — O99210 Obesity complicating pregnancy, unspecified trimester: Secondary | ICD-10-CM

## 2021-09-08 DIAGNOSIS — B182 Chronic viral hepatitis C: Secondary | ICD-10-CM

## 2021-09-08 DIAGNOSIS — O30043 Twin pregnancy, dichorionic/diamniotic, third trimester: Secondary | ICD-10-CM

## 2021-09-08 DIAGNOSIS — O26893 Other specified pregnancy related conditions, third trimester: Secondary | ICD-10-CM

## 2021-09-08 DIAGNOSIS — Z3A31 31 weeks gestation of pregnancy: Secondary | ICD-10-CM

## 2021-09-08 DIAGNOSIS — O0990 Supervision of high risk pregnancy, unspecified, unspecified trimester: Secondary | ICD-10-CM

## 2021-09-08 DIAGNOSIS — O24319 Unspecified pre-existing diabetes mellitus in pregnancy, unspecified trimester: Secondary | ICD-10-CM

## 2021-09-08 DIAGNOSIS — R12 Heartburn: Secondary | ICD-10-CM

## 2021-09-08 DIAGNOSIS — Z794 Long term (current) use of insulin: Secondary | ICD-10-CM

## 2021-09-08 MED ORDER — PANTOPRAZOLE SODIUM 40 MG PO TBEC: 40.0000 mg | DELAYED_RELEASE_TABLET | Freq: Every day | ORAL | 1 refills | Status: DC

## 2021-09-08 NOTE — Progress Notes (Signed)
Omnipod basal rate: 40 u insulin aspart daily Omnipod bolus: reports using 35-40 u each day at a 1 u to 6 gram carb ratio  Reports complaint of heartburn not resolved by Tums. Taking approx 6 per day which is max dose on directions. Would like to know if safe to take Tylenol due to Hep C. Reports back pain.

## 2021-09-08 NOTE — Progress Notes (Signed)
   PRENATAL VISIT NOTE  Subjective:  Crystal Castillo is a 31 y.o. G2P1001 at [redacted]w[redacted]d being seen today for ongoing prenatal care.  She is currently monitored for the following issues for this high-risk pregnancy and has Major depressive disorder, recurrent severe without psychotic features ([name]); Borderline personality disorder ([name]); Maternal morbid obesity, antepartum ([name]); Pre-existing insulin treated diabetes mellitus during pregnancy ([name]); Drug abuse ([name]); Type 2 diabetes mellitus without complication ([name]); Supervision of high risk pregnancy, antepartum; Hepatitis C infection; Dichorionic diamniotic twin pregnancy; Abnormal genetic test during pregnancy; Cirrhosis ([name]); and Vaccine counseling on their problem list.  Patient doing well with no acute concerns today. She reports  generalized discomfort .  Contractions: Irritability. Vag. Bleeding: None.  Movement: Present. Denies leaking of fluid.   The following portions of the patient's history were reviewed and updated as appropriate: allergies, current medications, past family history, past medical history, past social history, past surgical history and problem list. Problem list updated.  Objective:   Vitals:   09/08/21 1024  BP: (!) 91/47  Pulse: 89  Weight: 294 lb 9.6 oz (133.6 kg)    Fetal Status: Fetal Heart Rate (bpm): 141/136 Fundal Height: 34 cm Movement: Present     General:  Alert, oriented and cooperative. Patient is in no acute distress.  Skin: Skin is warm and dry. No rash noted.   Cardiovascular: Normal heart rate noted  Respiratory: Normal respiratory effort, no problems with respiration noted  Abdomen: Soft, gravid, appropriate for gestational age.  Pain/Pressure: Present     Pelvic: Cervical exam deferred        Extremities: Normal range of motion.  Edema: None  Mental Status:  Normal mood and affect. Normal behavior. Normal judgment and thought content.   Assessment and Plan:  Pregnancy: G2P1001 at [redacted]w[redacted]d  1. [redacted]  weeks gestation of pregnancy   2. Hepatitis C virus carrier state [facility]) Reviewed infectious disease notes and liver function labs,  LFT appear to be WNL  3. Pre-existing insulin treated diabetes mellitus during pregnancy ([name]) Pt on omnipod.  She had several intervals with low blood sugar.  Pt advised to slightly decrease bolus insulin from 14 to 12 at meals.  Will monitor basal rate.  4. Maternal morbid obesity, antepartum ([name])   5. Supervision of high risk pregnancy, antepartum Continue routine prenatal care  6. Dichorionic diamniotic twin pregnancy in third trimester Currently breech/transverse Continue weekly fetal BPP  7. Heartburn during pregnancy in third trimester Rx for protonix sent  Preterm labor symptoms and general obstetric precautions including but not limited to vaginal bleeding, contractions, leaking of fluid and fetal movement were reviewed in detail with the patient.  Please refer to After Visit Summary for other counseling recommendations.   Return in about 2 weeks (around 09/22/2021) for [facility], in person.   Mariel Aloe, MD Faculty Attending Center for [facility]

## 2021-09-08 NOTE — Therapy (Deleted)
OUTPATIENT PHYSICAL THERAPY TREATMENT NOTE   Patient Name: Crystal Castillo MRN: [id number] DOB:[birth year], 31 y.o., female Today's Date: 09/08/2021  PCP: Eartha Inch, MD   REFERRING PROVIDER:  Catalina Antigua, MD    END OF SESSION:      Past Medical History:  Diagnosis Date   Anorexia nervosa with bulimia    just finished treatment in August 2012 for this   Anxiety    Bartholin's cyst    Cirrhosis ([name]) 05/22/2021   Depression    Diabetes mellitus    not currently treated for this. Pt states she was told she had DM, but lost @  150 lbs in 9 months.   Diabetes mellitus without complication ([name])    Hepatitis C    Obesity    PTSD (post-traumatic stress disorder)    Vaccine counseling 05/22/2021   Past Surgical History:  Procedure Laterality Date   TONSILLECTOMY     WISDOM TOOTH EXTRACTION     Patient Active Problem List   Diagnosis Date Noted   Cirrhosis ([name]) 05/22/2021   Vaccine counseling 05/22/2021   Dichorionic diamniotic twin pregnancy 05/01/2021   Abnormal genetic test during pregnancy 05/01/2021   Hepatitis C infection 04/14/2021   Supervision of high risk pregnancy, antepartum 04/10/2021   Type 2 diabetes mellitus without complication ([name]) 09/22/2018   Maternal morbid obesity, antepartum ([name]) 01/20/2018   Pre-existing insulin treated diabetes mellitus during pregnancy ([name]) 01/20/2018   Borderline personality disorder ([name]) 08/19/2016   Major depressive disorder, recurrent severe without psychotic features ([name]) 08/18/2016   Drug abuse ([name]) 05/09/2016    REFERRING DIAG: R10.2 (ICD-10-CM) - Pelvic pain  THERAPY DIAG:  No diagnosis found.  Rationale for Evaluation and Treatment Rehabilitation  PERTINENT HISTORY: Bartholin's cyst, PTSD  PRECAUTIONS: None  SUBJECTIVE: I have been very busy lately.  I am feeling sore right in the pubic symphysis  PAIN:  Are you having pain? No   OBJECTIVE: (objective measures completed at initial evaluation  unless otherwise dated)   PATIENT SURVEYS:      PFIQ-7 = 43 (POPIQ)   COGNITION:            Overall cognitive status: Within functional limits for tasks assessed                  MUSCLE LENGTH: Hamstrings: Right 80 deg; Left 80 deg Thomas test: Right 10 deg; Left 10 deg   LUMBAR SPECIAL TESTS:  Straight leg raise test: Positive for abdominal weakness  improved with pelvic compression     GAIT:   Comments: WFL for pregnancy wider BOS                POSTURE: increased lumbar lordosis, anterior pelvic tilt, weight shift right, and more lordotic and anteriorly tilted on the left ilium               PELVIC ALIGNMENT:   LUMBARAROM/PROM   A/PROM A/PROM  eval  Flexion    Extension    Right lateral flexion    Left lateral flexion    Right rotation    Left rotation     (Blank rows = not tested)   LOWER EXTREMITY ROM:   passive ROM Right eval Left eval  Hip flexion      Hip extension      Hip abduction      Hip adduction      Hip internal rotation   25% limited  Hip external rotation  Knee flexion      Knee extension      Ankle dorsiflexion      Ankle plantarflexion      Ankle inversion      Ankle eversion       (Blank rows = not tested)   LOWER EXTREMITY MMT:   MMT Right eval Left eval  Hip flexion      Hip extension      Hip abduction 4/5 4/5  Hip adduction      Hip internal rotation      Hip external rotation      Knee flexion      Knee extension      Ankle dorsiflexion      Ankle plantarflexion      Ankle inversion      Ankle eversion         PALPATION:   General  TTP rectus abdominus attachment; lumbar paraspinals tight; gluteals tight; Lt ilium ant rotation                 External Perineal Exam [facility]                             Internal Pelvic Floor 3/5 after one that was 2/5; holding 6 sec x 1 rep   Patient confirms identification and approves PT to assess internal pelvic floor and treatment Yes           TONE: High on the right  side   PROLAPSE: None noticed   TODAY'S TREATMENT  Treatment: 08/26/21 Exercises  Cat cow  Rotation in quadruped [place] squeeze adductor Quadruped hip shift Pallof press and rotation with LE on on step  Manual Supine round ligament release and abdominal fascial release  Treatment: 08/14/21 Exercises  Cat cow  Pelvic tilt at wall Pelvic tilt supine Thoracic rotation supine Seated thoracic rotation Transversus abdominus activation in qped  Manual Side lying: sacral mobs and distraction, lumbar and thoracic paraspinals        PATIENT EDUCATION:  Education details: Access Code: [email or web address] added qped rotation, pelvic shift in qped and ball squeeze Person educated: Patient Education method: Explanation, Demonstration, Verbal cues, and Handouts Education comprehension: verbalized understanding and returned demonstration     HOME EXERCISE PROGRAM: Access Code: [email or web address] URL: [email or web address] Date: 08/14/2021 Prepared by: Dwana Curd  Exercises - Supine Butterfly Groin Stretch  - 1 x daily - 7 x weekly - 1 sets - 3 reps - 30 sec hold - Hooklying Single Knee to Chest Stretch  - 1 x daily - 7 x weekly - 1 sets - 5 reps - 20 sec hold - Cat Cow in [place] with Pool Noodle  - 1 x daily - 7 x weekly - 3 sets - 10 reps - Pelvic Tilt at Pool Wall  - 1 x daily - 7 x weekly - 3 sets - 10 reps - Supine Upper Trunk and Cervical Rotation with Opposite Lower Trunk Rotation  - 1 x daily - 7 x weekly - 1 sets - 10 reps - 5 hold - Seated Thoracic Flexion and Rotation with Arms Crossed  - 1 x daily - 7 x weekly - 1 sets - 10 reps - 5 sec hold   ASSESSMENT:   CLINICAL IMPRESSION: Pt did well with exercises and progression provided for improved pelvic mobility.  Pt had increased back pain with pallof press in standing after 8-10 reps.  She will benefit from skilled  PT to continue to address core strength and improved pelvic mobility for healthy pregnancy.      OBJECTIVE IMPAIRMENTS decreased activity tolerance, decreased coordination, decreased endurance, difficulty walking, decreased ROM, decreased strength, increased muscle spasms, postural dysfunction, and pain.    ACTIVITY LIMITATIONS lifting, sitting, standing, squatting, transfers, and bed mobility   PARTICIPATION LIMITATIONS: driving, community activity, and occupation   PERSONAL FACTORS 1 comorbidity: pregnant with twins and 1-2 comorbidities: 2nd deg vaginal tear in 2020 vaginal delivery  are also affecting patient's functional outcome.    REHAB POTENTIAL: Excellent   CLINICAL DECISION MAKING: Evolving/moderate complexity   EVALUATION COMPLEXITY: Moderate     GOALS: Goals reviewed with patient? Yes   SHORT TERM GOALS: Target date: 08/26/2021   Ind with initial HEP Baseline: Goal status: Ongoing         LONG TERM GOALS: Target date: 10/21/2021    Pt will report at least 50% less pain Baseline:  Goal status: INITIAL   2.  Pt will be able to walk for 30 minutes or more as part of healthy pregnancy Baseline: < 30 minutes and has too much pain Goal status: INITIAL   3.  Pt will be independent with advanced HEP to maintain improvements made throughout therapy   Baseline:  Goal status: INITIAL   4.  Pt will be able to tolerate a 1 hour drive without increased pain Baseline: pain increases to 8/10 Goal status: INITIAL       PLAN: PT FREQUENCY: 1x/week   PT DURATION: 1 week   PLANNED INTERVENTIONS: Therapeutic exercises, Therapeutic activity, Neuromuscular re-education, Balance training, Gait training, Patient/Family education, Joint mobilization, Aquatic Therapy, Electrical stimulation, Cryotherapy, Moist heat, Taping, Biofeedback, Manual therapy, and Re-evaluation   PLAN FOR NEXT SESSION: core strength,  and pelvic moblity continued, nustep or eliptical maybe    [employer], PT 09/08/2021, 5:02 PM

## 2021-09-09 ENCOUNTER — Ambulatory Visit: Payer: Medicaid Other | Admitting: Physical Therapy

## 2021-09-16 ENCOUNTER — Ambulatory Visit: Payer: Medicaid Other | Attending: Obstetrics and Gynecology | Admitting: Physical Therapy

## 2021-09-16 ENCOUNTER — Encounter: Payer: Self-pay | Admitting: Physical Therapy

## 2021-09-16 DIAGNOSIS — M6281 Muscle weakness (generalized): Secondary | ICD-10-CM | POA: Insufficient documentation

## 2021-09-16 DIAGNOSIS — M62838 Other muscle spasm: Secondary | ICD-10-CM | POA: Insufficient documentation

## 2021-09-16 NOTE — Therapy (Signed)
OUTPATIENT PHYSICAL THERAPY TREATMENT NOTE   Patient Name: Crystal Castillo MRN: [id number] DOB:1991/01/09, 31 y.o., female Today's Date: 09/16/2021  PCP: Chesley Noon, MD   REFERRING PROVIDER:  Mora Bellman, MD    END OF SESSION:   PT End of Session - 09/16/21 1547     Visit Number 4    Date for PT Re-Evaluation 10/21/21    Authorization Type [place] MEDICA[employer]    PT Start Time 8242    PT Stop Time 1534    PT Time Calculation (min) 43 min    Activity Tolerance Patient tolerated treatment well    Behavior During Therapy [facility] for tasks assessed/performed               Past Medical History:  Diagnosis Date   Anorexia nervosa with bulimia    just finished treatment in August 2012 for this   Anxiety    Bartholin's cyst    Cirrhosis ([place]) 05/22/2021   Depression    Diabetes mellitus    not currently treated for this. Pt states she was told she had DM, but lost @  150 lbs in 9 months.   Diabetes mellitus without complication ([place])    Hepatitis C    Obesity    PTSD (post-traumatic stress disorder)    Vaccine counseling 05/22/2021   Past Surgical History:  Procedure Laterality Date   TONSILLECTOMY     WISDOM TOOTH EXTRACTION     Patient Active Problem List   Diagnosis Date Noted   Cirrhosis ([place]) 05/22/2021   Vaccine counseling 05/22/2021   Dichorionic diamniotic twin pregnancy 05/01/2021   Abnormal genetic test during pregnancy 05/01/2021   Hepatitis C infection 04/14/2021   Supervision of high risk pregnancy, antepartum 04/10/2021   Type 2 diabetes mellitus without complication ([place]) 35/36/1443   Maternal morbid obesity, antepartum ([place]) 01/20/2018   Pre-existing insulin treated diabetes mellitus during pregnancy ([place]) 01/20/2018   Borderline personality disorder ([place]) 08/19/2016   Major depressive disorder, recurrent severe without psychotic features ([place]) 08/18/2016   Drug abuse ([place]) 05/09/2016    REFERRING DIAG: R10.2 (ICD-10-CM) - Pelvic  pain  THERAPY DIAG:  Muscle weakness (generalized)  Other muscle spasm  Rationale for Evaluation and Treatment Rehabilitation  PERTINENT HISTORY: Bartholin's cyst, PTSD  PRECAUTIONS: None  SUBJECTIVE: I feel right in the pubic symphysis and the right groin and it burns.  It hurts when I get up to move and it is sharp and painful.  PAIN:  Are you having pain? No   OBJECTIVE: (objective measures completed at initial evaluation unless otherwise dated)   PATIENT SURVEYS:      PFIQ-7 = 43 (POPIQ) at eval; 19 at d/c   COGNITION:            Overall cognitive status: Within functional limits for tasks assessed                  MUSCLE LENGTH: Hamstrings: Right 80 deg; Left 80 deg Thomas test: Right 10 deg; Left 10 deg   LUMBAR SPECIAL TESTS:  Straight leg raise test: Positive for abdominal weakness  improved with pelvic compression     GAIT:   Comments: WFL for pregnancy wider BOS                POSTURE: increased lumbar lordosis, anterior pelvic tilt, weight shift right, and more lordotic and anteriorly tilted on the left ilium               PELVIC  ALIGNMENT:   LUMBARAROM/PROM   A/PROM A/PROM  eval  Flexion    Extension    Right lateral flexion    Left lateral flexion    Right rotation    Left rotation     (Blank rows = not tested)   LOWER EXTREMITY ROM:   passive ROM Right eval Left eval  Hip flexion      Hip extension      Hip abduction      Hip adduction      Hip internal rotation   25% limited  Hip external rotation      Knee flexion      Knee extension      Ankle dorsiflexion      Ankle plantarflexion      Ankle inversion      Ankle eversion       (Blank rows = not tested)   LOWER EXTREMITY MMT:   MMT Right eval Left eval  Hip flexion      Hip extension      Hip abduction 4/5 4/5  Hip adduction      Hip internal rotation      Hip external rotation      Knee flexion      Knee extension      Ankle dorsiflexion      Ankle  plantarflexion      Ankle inversion      Ankle eversion         PALPATION:   General  TTP rectus abdominus attachment; lumbar paraspinals tight; gluteals tight; Lt ilium ant rotation                 External Perineal Exam [facility]                             Internal Pelvic Floor 3/5 after one that was 2/5; holding 6 sec x 1 rep   Patient confirms identification and approves PT to assess internal pelvic floor and treatment Yes           TONE: High on the right side   PROLAPSE: None noticed   TODAY'S TREATMENT  Treatment: 09/16/21  Exercises  Cat cow  Circles Hip shift in standing Seated adductors stretch Reviewed labor positions and stretching the pelvis leading to labor for more space and comfort in the abdomen and body  Manual Supine round ligament and groin stretch ischiocavernosis  Treatment: 08/26/21 Exercises  Cat cow  Rotation in quadruped [place] squeeze adductor Quadruped hip shift Pallof press and rotation with LE on on step  Manual Supine round ligament release and abdominal fascial release  Treatment: 08/14/21 Exercises  Cat cow  Pelvic tilt at wall Pelvic tilt supine Thoracic rotation supine Seated thoracic rotation Transversus abdominus activation in qped  Manual Side lying: sacral mobs and distraction, lumbar and thoracic paraspinals        PATIENT EDUCATION:  Education details: Access Code: [email or web address] added qped rotation, pelvic shift in qped and ball squeeze Person educated: Patient Education method: Explanation, Demonstration, Verbal cues, and Handouts Education comprehension: verbalized understanding and returned demonstration     HOME EXERCISE PROGRAM: Access Code: [email or web address] URL: [email or web address] Date: 08/14/2021 Prepared by: Jari Favre  Exercises - Supine Butterfly Groin Stretch  - 1 x daily - 7 x weekly - 1 sets - 3 reps - 30 sec hold - Hooklying Single Knee to Chest Stretch  - 1 x daily - 7 x weekly -  1  sets - 5 reps - 20 sec hold - Cat Cow in [place] with Pool Noodle  - 1 x daily - 7 x weekly - 3 sets - 10 reps - Pelvic Tilt at Pool Wall  - 1 x daily - 7 x weekly - 3 sets - 10 reps - Supine Upper Trunk and Cervical Rotation with Opposite Lower Trunk Rotation  - 1 x daily - 7 x weekly - 1 sets - 10 reps - 5 hold - Seated Thoracic Flexion and Rotation with Arms Crossed  - 1 x daily - 7 x weekly - 1 sets - 10 reps - 5 sec hold   ASSESSMENT:   CLINICAL IMPRESSION: Pt did well with HEP.  She has met all goals and feels like she can get better but has to make time for herself.  Pt will d/c today with HEP as she is ind with this at this time.     OBJECTIVE IMPAIRMENTS decreased activity tolerance, decreased coordination, decreased endurance, difficulty walking, decreased ROM, decreased strength, increased muscle spasms, postural dysfunction, and pain.    ACTIVITY LIMITATIONS lifting, sitting, standing, squatting, transfers, and bed mobility   PARTICIPATION LIMITATIONS: driving, community activity, and occupation   PERSONAL FACTORS 1 comorbidity: pregnant with twins and 1-2 comorbidities: 2nd deg vaginal tear in 2020 vaginal delivery  are also affecting patient's functional outcome.    REHAB POTENTIAL: Excellent   CLINICAL DECISION MAKING: Evolving/moderate complexity   EVALUATION COMPLEXITY: Moderate     GOALS: Goals reviewed with patient? Yes   SHORT TERM GOALS: Target date: 08/26/2021   Ind with initial HEP Baseline: Goal status: Ongoing         LONG TERM GOALS: Target date: 10/21/2021    Pt will report at least 50% less pain Baseline: 50% Goal status: Met   2.  Pt will be able to walk for 30 minutes or more as part of healthy pregnancy Baseline: 30-60 minutes Goal status: Met   3.  Pt will be independent with advanced HEP to maintain improvements made throughout therapy   Baseline:  Goal status: MET   4.  Pt will be able to tolerate a 1 hour drive without  increased pain Baseline: can drive for 1 hour Goal status: Met       PLAN: PT FREQUENCY: 1x/week   PT DURATION: 1 week   PLANNED INTERVENTIONS: Therapeutic exercises, Therapeutic activity, Neuromuscular re-education, Balance training, Gait training, Patient/Family education, Joint mobilization, Aquatic Therapy, Electrical stimulation, Cryotherapy, Moist heat, Taping, Biofeedback, Manual therapy, and Re-evaluation   PLAN FOR NEXT SESSION: d/c    Jule Ser, PT 09/16/2021, 4:20 PM  PHYSICAL THERAPY DISCHARGE SUMMARY  Visits from Start of Care: 4  Current functional level related to goals / functional outcomes:   See above goals Remaining deficits: See above   Education / Equipment: HEP   Patient agrees to discharge. Patient goals were met. Patient is being discharged due to meeting the stated rehab goals.  Gustavus Bryant, PT 09/16/21 4:20 PM

## 2021-09-17 ENCOUNTER — Ambulatory Visit: Payer: Medicaid Other | Admitting: *Deleted

## 2021-09-17 ENCOUNTER — Other Ambulatory Visit: Payer: Self-pay | Admitting: Obstetrics

## 2021-09-17 ENCOUNTER — Ambulatory Visit: Payer: Medicaid Other | Attending: Obstetrics

## 2021-09-17 VITALS — BP 111/70 | HR 92

## 2021-09-17 DIAGNOSIS — Z6841 Body Mass Index (BMI) 40.0 and over, adult: Secondary | ICD-10-CM | POA: Diagnosis present

## 2021-09-17 DIAGNOSIS — O30043 Twin pregnancy, dichorionic/diamniotic, third trimester: Secondary | ICD-10-CM

## 2021-09-17 DIAGNOSIS — O280 Abnormal hematological finding on antenatal screening of mother: Secondary | ICD-10-CM | POA: Diagnosis not present

## 2021-09-17 DIAGNOSIS — R772 Abnormality of alphafetoprotein: Secondary | ICD-10-CM

## 2021-09-17 DIAGNOSIS — O99213 Obesity complicating pregnancy, third trimester: Secondary | ICD-10-CM

## 2021-09-17 DIAGNOSIS — O283 Abnormal ultrasonic finding on antenatal screening of mother: Secondary | ICD-10-CM | POA: Insufficient documentation

## 2021-09-17 DIAGNOSIS — E119 Type 2 diabetes mellitus without complications: Secondary | ICD-10-CM | POA: Diagnosis not present

## 2021-09-17 DIAGNOSIS — Z794 Long term (current) use of insulin: Secondary | ICD-10-CM

## 2021-09-17 DIAGNOSIS — O98413 Viral hepatitis complicating pregnancy, third trimester: Secondary | ICD-10-CM

## 2021-09-17 DIAGNOSIS — E669 Obesity, unspecified: Secondary | ICD-10-CM

## 2021-09-17 DIAGNOSIS — Z3A32 32 weeks gestation of pregnancy: Secondary | ICD-10-CM

## 2021-09-17 DIAGNOSIS — B182 Chronic viral hepatitis C: Secondary | ICD-10-CM

## 2021-09-17 DIAGNOSIS — O24113 Pre-existing diabetes mellitus, type 2, in pregnancy, third trimester: Secondary | ICD-10-CM | POA: Diagnosis not present

## 2021-09-17 NOTE — Procedures (Signed)
Crystal Castillo 04/07/1990 [redacted]w[redacted]d   Fetus B Non-Stress Test Interpretation for 09/17/21  Indication:  di di twins  Fetal Heart Rate Fetus B Mode: External Baseline Rate (B): 140 BPM Variability: Moderate Accelerations: 15 x 15 Decelerations: None  Uterine Activity Mode: Palpation, Toco Contraction Frequency (min): none Resting Tone Palpated: Relaxed  Interpretation (Baby B - Fetal Testing) Nonstress Test Interpretation (Baby B): Reactive Overall Impression (Baby B): Reassuring for gestational age Comments (Baby B): Dr. Parke Poisson reviewed tracing  Crystal Castillo 02-13-1991 [redacted]w[redacted]d  Fetus A Non-Stress Test Interpretation for 09/17/21  Indication: Unsatisfactory BPP  Fetal Heart Rate A Mode: External Baseline Rate (A): 135 bpm Variability: Moderate Accelerations: 15 x 15 Decelerations: None Multiple birth?: Yes  Uterine Activity Mode: Palpation, Toco Contraction Frequency (min): none Resting Tone Palpated: Relaxed  Interpretation (Fetal Testing) Nonstress Test Interpretation: Reactive Overall Impression: Reassuring for gestational age Comments: Dr. Parke Poisson reviewed tracing

## 2021-09-18 ENCOUNTER — Other Ambulatory Visit: Payer: Self-pay | Admitting: *Deleted

## 2021-09-18 DIAGNOSIS — B182 Chronic viral hepatitis C: Secondary | ICD-10-CM

## 2021-09-18 DIAGNOSIS — O99213 Obesity complicating pregnancy, third trimester: Secondary | ICD-10-CM

## 2021-09-18 DIAGNOSIS — O30049 Twin pregnancy, dichorionic/diamniotic, unspecified trimester: Secondary | ICD-10-CM

## 2021-09-18 DIAGNOSIS — O24113 Pre-existing diabetes mellitus, type 2, in pregnancy, third trimester: Secondary | ICD-10-CM

## 2021-09-23 ENCOUNTER — Encounter: Payer: Self-pay | Admitting: Physical Therapy

## 2021-09-24 ENCOUNTER — Ambulatory Visit: Payer: Medicaid Other | Admitting: *Deleted

## 2021-09-24 ENCOUNTER — Ambulatory Visit: Payer: Medicaid Other | Attending: Obstetrics

## 2021-09-24 ENCOUNTER — Other Ambulatory Visit: Payer: Self-pay

## 2021-09-24 ENCOUNTER — Ambulatory Visit (INDEPENDENT_AMBULATORY_CARE_PROVIDER_SITE_OTHER): Payer: Medicaid Other | Admitting: Obstetrics and Gynecology

## 2021-09-24 VITALS — BP 115/67 | HR 96

## 2021-09-24 VITALS — BP 107/65 | HR 95 | Wt 299.3 lb

## 2021-09-24 DIAGNOSIS — R772 Abnormality of alphafetoprotein: Secondary | ICD-10-CM

## 2021-09-24 DIAGNOSIS — O0990 Supervision of high risk pregnancy, unspecified, unspecified trimester: Secondary | ICD-10-CM

## 2021-09-24 DIAGNOSIS — B182 Chronic viral hepatitis C: Secondary | ICD-10-CM | POA: Diagnosis present

## 2021-09-24 DIAGNOSIS — E669 Obesity, unspecified: Secondary | ICD-10-CM

## 2021-09-24 DIAGNOSIS — E119 Type 2 diabetes mellitus without complications: Secondary | ICD-10-CM | POA: Diagnosis not present

## 2021-09-24 DIAGNOSIS — Z6841 Body Mass Index (BMI) 40.0 and over, adult: Secondary | ICD-10-CM

## 2021-09-24 DIAGNOSIS — Z3A33 33 weeks gestation of pregnancy: Secondary | ICD-10-CM

## 2021-09-24 DIAGNOSIS — O30043 Twin pregnancy, dichorionic/diamniotic, third trimester: Secondary | ICD-10-CM

## 2021-09-24 DIAGNOSIS — O24113 Pre-existing diabetes mellitus, type 2, in pregnancy, third trimester: Secondary | ICD-10-CM

## 2021-09-24 DIAGNOSIS — O280 Abnormal hematological finding on antenatal screening of mother: Secondary | ICD-10-CM | POA: Diagnosis not present

## 2021-09-24 DIAGNOSIS — O98413 Viral hepatitis complicating pregnancy, third trimester: Secondary | ICD-10-CM | POA: Diagnosis present

## 2021-09-24 DIAGNOSIS — O329XX0 Maternal care for malpresentation of fetus, unspecified, not applicable or unspecified: Secondary | ICD-10-CM | POA: Insufficient documentation

## 2021-09-24 DIAGNOSIS — O99213 Obesity complicating pregnancy, third trimester: Secondary | ICD-10-CM

## 2021-09-24 DIAGNOSIS — Z794 Long term (current) use of insulin: Secondary | ICD-10-CM

## 2021-09-24 DIAGNOSIS — O99210 Obesity complicating pregnancy, unspecified trimester: Secondary | ICD-10-CM

## 2021-09-24 DIAGNOSIS — O24319 Unspecified pre-existing diabetes mellitus in pregnancy, unspecified trimester: Secondary | ICD-10-CM

## 2021-09-24 DIAGNOSIS — O329XX1 Maternal care for malpresentation of fetus, unspecified, fetus 1: Secondary | ICD-10-CM

## 2021-09-24 NOTE — Progress Notes (Signed)
PRENATAL VISIT NOTE  Subjective:  Crystal Castillo is a 31 y.o. G2P1001 at [redacted]w[redacted]d being seen today for ongoing prenatal care.  She is currently monitored for the following issues for this high-risk pregnancy and has Major depressive disorder, recurrent severe without psychotic features ([name]); Borderline personality disorder ([name]); Maternal morbid obesity, antepartum ([name]); Pre-existing insulin treated diabetes mellitus during pregnancy ([name]); Drug abuse ([name]); Type 2 diabetes mellitus without complication ([name]); Supervision of high risk pregnancy, antepartum; Hepatitis C infection; Dichorionic diamniotic twin pregnancy; Abnormal genetic test during pregnancy; Cirrhosis ([name]); Vaccine counseling; BMI 45.0-49.9, adult ([name]); and Malpresentation before onset of labor on their problem list.  Patient reports  b/l LE edema at the end of the day and some pannus edema (otherwise asymptomatic) .  Contractions: Irritability. Vag. Bleeding: None.  Movement: Present. Denies leaking of fluid.   The following portions of the patient's history were reviewed and updated as appropriate: allergies, current medications, past family history, past medical history, past social history, past surgical history and problem list.   Objective:   Vitals:   09/24/21 0942  BP: 107/65  Pulse: 95  Weight: 299 lb 4.8 oz (135.8 kg)    Fetal Status: Fetal Heart Rate (bpm): 149/150   Movement: Present     General:  Alert, oriented and cooperative. Patient is in no acute distress.  Skin: Skin is warm and dry. No rash noted.   Cardiovascular: Normal heart rate noted  Respiratory: Normal respiratory effort, no problems with respiration noted  Abdomen: Soft, gravid, appropriate for gestational age.  Pain/Pressure: Absent     Pelvic: Cervical exam deferred        Extremities: Normal range of motion.  Edema: None  Mental Status: Normal mood and affect. Normal behavior. Normal judgment and thought content.  LEs: no edema Mild edema  and slight peau d'orange at her belly, nttp, normal skin color Assessment and Plan:  Pregnancy: G2P1001 at [redacted]w[redacted]d 1. Hepatitis C virus carrier state ([name]) Try to avoid FSE, OVD, prolonged AROM Needs GI/ID f/u PP    Latest Ref Rng & Units 08/29/2021   11:10 AM 06/19/2021   11:44 AM 04/24/2021    2:32 PM  CMP  Glucose 70 - 99 mg/dL 73  79    BUN 6 - 20 mg/dL 6  7    Creatinine 4.08 - 1.00 mg/dL 1.44  8.18    Sodium 563 - 144 mmol/L 136  137    Potassium 3.5 - 5.2 mmol/L 4.3  4.1    Chloride 96 - 106 mmol/L 104  104    CO2 20 - 29 mmol/L 19  19    Calcium 8.7 - 10.2 mg/dL 8.3  8.9    Total Protein 6.0 - 8.5 g/dL 5.5  5.9    Total Bilirubin 0.0 - 1.2 mg/dL 0.2  0.2    Alkaline Phos 44 - 121 IU/L 71  51    AST 0 - 40 IU/L 18  20    ALT 0 - 32 IU/L 16  21  31      2. Pre-existing insulin treated diabetes mellitus during pregnancy ([name]) Doing great on omnipod and additional SQ insulin.  CBG log 80s-90s AM fasting, 2 hour PP in the 100s-110s  3. [redacted] weeks gestation of pregnancy Pt doesn't want any more kids but unsure if she wants btl and partner doesn't want vasectomy. R/b/a d/w her and she is more open to btl if she has a c/s. Papers signed today and f/u more  next week Compression stockings for LEs advised  4. Supervision of high risk pregnancy, antepartum  5. Maternal morbid obesity, antepartum ([name]) Weight good. 10lbs TWG  6. BMI 45.0-49.9, adult ([name])  7. Dichorionic diamniotic twin pregnancy in third trimester F/u bpp today. Continue weekly scans Touch base with mfm if they don't advise delivery timing after today. I told her most likely will recommend 37wk delivery given her DM2 8/2: breech, 1762g, 16%, 8/10, afi wnl transv, 2035gm, 54%, ac 70%,  afi wnl, 13% discordance  8. Malpresentation before onset of labor, fetus 1 of multiple gestation A has been breech. F/u next week. D/w her that if still non vertex to schedule c/s and can rescheduled to IOL if becomes vertex.    Preterm labor symptoms and general obstetric precautions including but not limited to vaginal bleeding, contractions, leaking of fluid and fetal movement were reviewed in detail with the patient. Please refer to After Visit Summary for other counseling recommendations.   Return in about 1 week (around 10/01/2021) for in person, high risk ob, in person or virtual, md visit.  Future Appointments  Date Time Provider Department Center  09/24/2021  1:15 PM [facility] NURSE [facility]  09/24/2021  1:30 PM [facility] US2 [facility]  10/01/2021  1:15 PM [facility] NURSE [facility]  10/01/2021  1:30 PM [facility] US2 [facility]  10/08/2021  2:15 PM [facility] NURSE [facility]  10/08/2021  2:30 PM [facility] US3 [facility]  10/08/2021  4:15 PM Venora Maples, MD [facility]  10/15/2021 10:30 AM [facility] NURSE [facility]  10/15/2021 10:45 AM [facility] US4 [facility]  10/15/2021  2:55 PM Venora Maples, MD [facility]  10/22/2021  2:15 PM Venora Maples, MD [facility]  10/29/2021  2:35 PM Venora Maples, MD [facility]  11/05/2021  2:15 PM Venora Maples, MD [facility]  11/12/2021  1:15 PM [facility]  11/12/2021  2:15 PM Venora Maples, MD [facility]  11/21/2021  3:00 PM [facility] NURSE [facility]    Palmer Bing, MD

## 2021-09-24 NOTE — Progress Notes (Signed)
Patient is concerned about retaining excess fluid at the bottom of her abdomen. Pt mentions that there's a sac like pocket of fluid on her abdomen. She also mentions that she experiences slight edema after walking during the day.

## 2021-09-25 ENCOUNTER — Encounter: Payer: Self-pay | Admitting: *Deleted

## 2021-10-01 ENCOUNTER — Ambulatory Visit: Payer: Medicaid Other | Attending: Obstetrics

## 2021-10-01 ENCOUNTER — Ambulatory Visit (INDEPENDENT_AMBULATORY_CARE_PROVIDER_SITE_OTHER): Payer: Medicaid Other | Admitting: Obstetrics and Gynecology

## 2021-10-01 ENCOUNTER — Ambulatory Visit: Payer: Medicaid Other | Admitting: *Deleted

## 2021-10-01 VITALS — BP 122/57 | HR 91

## 2021-10-01 DIAGNOSIS — O280 Abnormal hematological finding on antenatal screening of mother: Secondary | ICD-10-CM

## 2021-10-01 DIAGNOSIS — Z794 Long term (current) use of insulin: Secondary | ICD-10-CM | POA: Diagnosis not present

## 2021-10-01 DIAGNOSIS — O24113 Pre-existing diabetes mellitus, type 2, in pregnancy, third trimester: Secondary | ICD-10-CM | POA: Insufficient documentation

## 2021-10-01 DIAGNOSIS — O30043 Twin pregnancy, dichorionic/diamniotic, third trimester: Secondary | ICD-10-CM

## 2021-10-01 DIAGNOSIS — O98419 Viral hepatitis complicating pregnancy, unspecified trimester: Secondary | ICD-10-CM | POA: Insufficient documentation

## 2021-10-01 DIAGNOSIS — B182 Chronic viral hepatitis C: Secondary | ICD-10-CM | POA: Insufficient documentation

## 2021-10-01 DIAGNOSIS — Z3A34 34 weeks gestation of pregnancy: Secondary | ICD-10-CM

## 2021-10-01 DIAGNOSIS — R772 Abnormality of alphafetoprotein: Secondary | ICD-10-CM | POA: Insufficient documentation

## 2021-10-01 DIAGNOSIS — O98413 Viral hepatitis complicating pregnancy, third trimester: Secondary | ICD-10-CM | POA: Diagnosis present

## 2021-10-01 DIAGNOSIS — E119 Type 2 diabetes mellitus without complications: Secondary | ICD-10-CM | POA: Diagnosis not present

## 2021-10-01 DIAGNOSIS — Z6841 Body Mass Index (BMI) 40.0 and over, adult: Secondary | ICD-10-CM | POA: Insufficient documentation

## 2021-10-02 NOTE — Progress Notes (Signed)
PRENATAL VISIT NOTE  Subjective:  Crystal Castillo is a 31 y.o. G2P1001 at [redacted]w[redacted]d being seen today for ongoing prenatal care.  She is currently monitored for the following issues for this high-risk pregnancy and has Major depressive disorder, recurrent severe without psychotic features ([name]); Borderline personality disorder ([name]); Maternal morbid obesity, antepartum ([name]); Pre-existing insulin treated diabetes mellitus during pregnancy ([name]); Drug abuse ([name]); Type 2 diabetes mellitus without complication ([name]); Supervision of high risk pregnancy, antepartum; Hepatitis C infection; Dichorionic diamniotic twin pregnancy; Abnormal genetic test during pregnancy; Cirrhosis ([name]); Vaccine counseling; BMI 45.0-49.9, adult ([name]); and Malpresentation before onset of labor on their problem list.  Patient reports no complaints.  The following portions of the patient's history were reviewed and updated as appropriate: allergies, current medications, past family history, past medical history, past social history, past surgical history and problem list.   Objective:  There were no vitals filed for this visit. 122/57   Fetal Status:           General:  Alert, oriented and cooperative. Patient is in no acute distress.  Skin: Skin is warm and dry. No rash noted.   Cardiovascular: Normal heart rate noted  Respiratory: Normal respiratory effort, no problems with respiration noted  Abdomen: Soft, gravid, appropriate for gestational age.        Pelvic: Cervical exam deferred        Extremities: Normal range of motion.     Mental Status: Normal mood and affect. Normal behavior. Normal judgment and thought content.   Assessment and Plan:  Pregnancy: G2P1001 at [redacted]w[redacted]d 1. 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 Pre-existing insulin treated diabetes mellitus during pregnancy ([name]) Doing great on omnipod and additional SQ insulin.  AM fastings and 2 hour  post prandials normal   3. [redacted] weeks gestation of pregnancy BTL if for c-section. Pt unsure of BTL if has a vag delivery (see below   4. Supervision of high risk pregnancy, antepartum  declines GBS today. Do next visit  5. Maternal morbid obesity, antepartum ([name]) Weight good. 10lbs TWG   6. BMI 45.0-49.9, adult ([name])   7. Dichorionic diamniotic twin pregnancy in third trimester A-breech, afi wnl, bpp 8/8 B-breech, afi wnl, bpp 8/8 MFM recommends 37wk delivery. Request sent for c-section at 37wks. If first baby turns cephalic, pt would like to try for vag delivery 8/2: breech, 1762g, 16%, 8/10, afi wnl transv, 2035gm, 54%, ac 70%,  afi wnl, 13% discordance   8. Malpresentation before onset of labor, fetus 1 of multiple gestation See above.   Preterm labor symptoms and general obstetric precautions including but not limited to vaginal bleeding, contractions, leaking of fluid and fetal movement were reviewed in detail with the patient. Please refer to After Visit Summary for other counseling recommendations.   No follow-ups on file.  Future Appointments  Date Time Provider Department Center  10/08/2021  2:15 PM [facility] NURSE [facility]  10/08/2021  2:30 PM [facility] US3 [facility]  10/08/2021  4:15 PM Venora Maples, MD [facility]  10/15/2021 10:30 AM [facility] NURSE [facility]  10/15/2021 10:45 AM [facility] US4 [facility]  10/15/2021  2:55 PM Venora Maples, MD [facility]  10/22/2021  2:15 PM Venora Maples, MD [facility]  10/29/2021  2:35 PM Venora Maples, MD [facility]  11/05/2021  2:15 PM Venora Maples, MD [facility]  11/12/2021  1:15 PM [facility]  [facility]  11/12/2021  2:15 PM Venora Maples, MD [facility]  11/21/2021 11:00 AM [facility] NURSE [facility]    Hamlin Bing, MD

## 2021-10-03 ENCOUNTER — Other Ambulatory Visit: Payer: Self-pay | Admitting: Obstetrics and Gynecology

## 2021-10-03 DIAGNOSIS — O329XX1 Maternal care for malpresentation of fetus, unspecified, fetus 1: Secondary | ICD-10-CM

## 2021-10-08 ENCOUNTER — Ambulatory Visit (INDEPENDENT_AMBULATORY_CARE_PROVIDER_SITE_OTHER): Payer: Medicaid Other | Admitting: Family Medicine

## 2021-10-08 ENCOUNTER — Other Ambulatory Visit: Payer: Self-pay | Admitting: Obstetrics

## 2021-10-08 ENCOUNTER — Encounter (HOSPITAL_COMMUNITY): Payer: Self-pay

## 2021-10-08 ENCOUNTER — Ambulatory Visit: Payer: Medicaid Other | Admitting: *Deleted

## 2021-10-08 ENCOUNTER — Ambulatory Visit: Payer: Medicaid Other | Attending: Obstetrics

## 2021-10-08 VITALS — BP 131/79 | HR 85

## 2021-10-08 VITALS — BP 129/89 | HR 94 | Wt 302.9 lb

## 2021-10-08 DIAGNOSIS — O30049 Twin pregnancy, dichorionic/diamniotic, unspecified trimester: Secondary | ICD-10-CM

## 2021-10-08 DIAGNOSIS — O98413 Viral hepatitis complicating pregnancy, third trimester: Secondary | ICD-10-CM

## 2021-10-08 DIAGNOSIS — B182 Chronic viral hepatitis C: Secondary | ICD-10-CM | POA: Diagnosis present

## 2021-10-08 DIAGNOSIS — O24319 Unspecified pre-existing diabetes mellitus in pregnancy, unspecified trimester: Secondary | ICD-10-CM

## 2021-10-08 DIAGNOSIS — E119 Type 2 diabetes mellitus without complications: Secondary | ICD-10-CM

## 2021-10-08 DIAGNOSIS — Z3A36 36 weeks gestation of pregnancy: Secondary | ICD-10-CM

## 2021-10-08 DIAGNOSIS — O24113 Pre-existing diabetes mellitus, type 2, in pregnancy, third trimester: Secondary | ICD-10-CM | POA: Diagnosis not present

## 2021-10-08 DIAGNOSIS — O329XX1 Maternal care for malpresentation of fetus, unspecified, fetus 1: Secondary | ICD-10-CM

## 2021-10-08 DIAGNOSIS — Z794 Long term (current) use of insulin: Secondary | ICD-10-CM

## 2021-10-08 DIAGNOSIS — O30043 Twin pregnancy, dichorionic/diamniotic, third trimester: Secondary | ICD-10-CM

## 2021-10-08 DIAGNOSIS — O285 Abnormal chromosomal and genetic finding on antenatal screening of mother: Secondary | ICD-10-CM

## 2021-10-08 DIAGNOSIS — O99213 Obesity complicating pregnancy, third trimester: Secondary | ICD-10-CM

## 2021-10-08 DIAGNOSIS — O98419 Viral hepatitis complicating pregnancy, unspecified trimester: Secondary | ICD-10-CM | POA: Diagnosis present

## 2021-10-08 DIAGNOSIS — O283 Abnormal ultrasonic finding on antenatal screening of mother: Secondary | ICD-10-CM | POA: Insufficient documentation

## 2021-10-08 DIAGNOSIS — O280 Abnormal hematological finding on antenatal screening of mother: Secondary | ICD-10-CM

## 2021-10-08 DIAGNOSIS — R772 Abnormality of alphafetoprotein: Secondary | ICD-10-CM

## 2021-10-08 DIAGNOSIS — E669 Obesity, unspecified: Secondary | ICD-10-CM

## 2021-10-08 DIAGNOSIS — O0990 Supervision of high risk pregnancy, unspecified, unspecified trimester: Secondary | ICD-10-CM

## 2021-10-08 MED ORDER — GLUCOSE 40 % PO GEL: 1.0000 | Freq: Once | ORAL | 0 refills | Status: DC | PRN

## 2021-10-08 NOTE — Progress Notes (Signed)
Subjective:  Crystal Castillo is a 31 y.o. G2P1001 at [redacted]w[redacted]d being seen today for ongoing prenatal care.  She is currently monitored for the following issues for this high-risk pregnancy and has Major depressive disorder, recurrent severe without psychotic features ([name]); Borderline personality disorder ([name]); Maternal morbid obesity, antepartum ([name]); Pre-existing insulin treated diabetes mellitus during pregnancy ([name]); Drug abuse ([name]); Type 2 diabetes mellitus without complication ([name]); Supervision of high risk pregnancy, antepartum; Hepatitis C infection; Dichorionic diamniotic twin pregnancy; Abnormal genetic test during pregnancy; Cirrhosis ([name]); Vaccine counseling; BMI 45.0-49.9, adult ([name]); and Malpresentation before onset of labor on their problem list.  Patient reports no complaints.  Contractions: Irritability. Vag. Bleeding: None.  Movement: Present. Denies leaking of fluid.   The following portions of the patient's history were reviewed and updated as appropriate: allergies, current medications, past family history, past medical history, past social history, past surgical history and problem list. Problem list updated.  Objective:   Vitals:   10/08/21 1644  BP: 129/89  Pulse: 94  Weight: (!) 302 lb 14.4 oz (137.4 kg)    Fetal Status: Fetal Heart Rate (bpm): 145/138   Movement: Present     General:  Alert, oriented and cooperative. Patient is in no acute distress.  Skin: Skin is warm and dry. No rash noted.   Cardiovascular: Normal heart rate noted  Respiratory: Normal respiratory effort, no problems with respiration noted  Abdomen: Soft, gravid, appropriate for gestational age. Pain/Pressure: Present     Pelvic: Vag. Bleeding: None     Cervical exam deferred        Extremities: Normal range of motion.     Mental Status: Normal mood and affect. Normal behavior. Normal judgment and thought content.   Urinalysis:      Assessment and Plan:  Pregnancy: G2P1001 at [redacted]w[redacted]d  1.  Supervision of high risk pregnancy, antepartum BP normal though borderline FHR normal Needs GBS swab at next visit  2. Chronic hepatitis C without hepatic coma ([name]) ID referral postpartum Had many good questions about this diagnosis and what she thought was a diagnosis of cirrhosis I reviewed chart in detail [place] of RUQ was normal without cirrhotic features, liver fibrosis serology panel was F0 (normal), LFT's were normal recently, and elastography was equivocal No INR available to review that I can see I told her that based on her imaging and labwork it does not appear she has cirrhosis, and it would also be fairly unlikely given her age Patient very relieved to hear this, as she was worried about this as well as delaying treatment for breastfeeding Planning on getting outside referral for treatment once she is done breastfeeding  3. Pre-existing insulin treated diabetes mellitus during pregnancy ([name]) Pump self adjusted due to elevated sugars Basal 2 u/hr, bolus 1u/5g of carbs calculated, sometimes boluses with pump and sometimes with pens Sugars look great Worried about hypoglycmia while fasting for surgery Will send glucose gel, recommended 25% reduction in basal rate when she wakes up on day of surgery  4. Dichorionic diamniotic twin pregnancy in third trimester Following closely w MFM, after discussion with them at last [place] timing of delivery is 37 weeks which is already scheduled Normal growth and discordance on last Korea Had [place] earlier today, result still pending  5. Malpresentation before onset of labor, fetus 1 of multiple gestation Fetus A still breech on [place] images from earlier today  Preterm labor symptoms and general obstetric precautions including but not limited to vaginal bleeding, contractions, leaking  of fluid and fetal movement were reviewed in detail with the patient. Please refer to After Visit Summary for other counseling recommendations.  Return in 1 week (on  10/15/2021) for [facility], ob visit.   Venora Maples, MD

## 2021-10-08 NOTE — Procedures (Signed)
Crystal Castillo 1990-09-17 [redacted]w[redacted]d   Fetus B Non-Stress Test Interpretation for 10/08/21  Indication:  di di twins  Fetal Heart Rate Fetus B Mode: External Baseline Rate (B): 130 BPM Variability: Moderate Accelerations: 15 x 15 Decelerations: None  Uterine Activity Mode: Palpation, Toco Contraction Frequency (min): none Resting Tone Palpated: Relaxed  Interpretation (Baby B - Fetal Testing) Nonstress Test Interpretation (Baby B): Reactive Overall Impression (Baby B): Reassuring for gestational age Comments (Baby B): Dr. Darra Lis reviewed tracing  Crystal Castillo [birth year] [redacted]w[redacted]d  Fetus A Non-Stress Test Interpretation for 10/08/21  Indication: Unsatisfactory BPP  Fetal Heart Rate A Mode: External Baseline Rate (A): 135 bpm Variability: Moderate Accelerations: 15 x 15 Decelerations: None Multiple birth?: Yes  Uterine Activity Mode: Palpation, Toco Contraction Frequency (min): none Resting Tone Palpated: Relaxed  Interpretation (Fetal Testing) Nonstress Test Interpretation: Reactive Overall Impression: Reassuring for gestational age Comments: Dr. Darra Lis reviewed tracing

## 2021-10-08 NOTE — Patient Instructions (Signed)

## 2021-10-08 NOTE — Patient Instructions (Signed)
Crystal Castillo  10/08/2021   Your procedure is scheduled on:  10/22/2021  Arrive at 0730 at Entrance C on [address] at [facility]  and [employer]. You are invited to use the FREE valet parking or use the Visitor's parking deck.  Pick up the phone at the desk and dial [phone].  Call this number if you have problems the morning of surgery: [phone]  Remember:   Do not eat food:(After Midnight) Desps de medianoche.  Do not drink clear liquids: (After Midnight) Desps de medianoche.  Take these medicines the morning of surgery with A SIP OF WATER:  Keep insulin pump running until surgery starts on 9/6.  No other medications on day of surgery   Do not wear jewelry, make-up or nail polish.  Do not wear lotions, powders, or perfumes. Do not wear deodorant.  Do not shave 48 hours prior to surgery.  Do not bring valuables to the hospital.  [facility] is not   responsible for any belongings or valuables brought to the hospital.  Contacts, dentures or bridgework may not be worn into surgery.  Leave suitcase in the car. After surgery it may be brought to your room.  For patients admitted to the hospital, checkout time is 11:00 AM the day of              discharge.      Please read over the following fact sheets that you were given:     Preparing for Surgery

## 2021-10-15 ENCOUNTER — Ambulatory Visit: Payer: Medicaid Other | Admitting: *Deleted

## 2021-10-15 ENCOUNTER — Ambulatory Visit: Payer: Medicaid Other | Attending: Obstetrics

## 2021-10-15 ENCOUNTER — Other Ambulatory Visit: Payer: Self-pay

## 2021-10-15 ENCOUNTER — Ambulatory Visit (INDEPENDENT_AMBULATORY_CARE_PROVIDER_SITE_OTHER): Payer: Medicaid Other | Admitting: Family Medicine

## 2021-10-15 ENCOUNTER — Other Ambulatory Visit (HOSPITAL_COMMUNITY)
Admission: RE | Admit: 2021-10-15 | Discharge: 2021-10-15 | Disposition: A | Discharge status: Home / Self Care | Payer: Medicaid Other | Source: Ambulatory Visit | Attending: Family Medicine | Admitting: Family Medicine

## 2021-10-15 ENCOUNTER — Encounter: Payer: Self-pay | Admitting: Family Medicine

## 2021-10-15 VITALS — BP 122/69 | HR 105

## 2021-10-15 VITALS — BP 110/74 | HR 96 | Wt 303.8 lb

## 2021-10-15 DIAGNOSIS — O24113 Pre-existing diabetes mellitus, type 2, in pregnancy, third trimester: Secondary | ICD-10-CM | POA: Diagnosis present

## 2021-10-15 DIAGNOSIS — Z3A36 36 weeks gestation of pregnancy: Secondary | ICD-10-CM

## 2021-10-15 DIAGNOSIS — O30043 Twin pregnancy, dichorionic/diamniotic, third trimester: Secondary | ICD-10-CM | POA: Diagnosis not present

## 2021-10-15 DIAGNOSIS — E119 Type 2 diabetes mellitus without complications: Secondary | ICD-10-CM

## 2021-10-15 DIAGNOSIS — B182 Chronic viral hepatitis C: Secondary | ICD-10-CM | POA: Diagnosis present

## 2021-10-15 DIAGNOSIS — O30049 Twin pregnancy, dichorionic/diamniotic, unspecified trimester: Secondary | ICD-10-CM | POA: Insufficient documentation

## 2021-10-15 DIAGNOSIS — O24319 Unspecified pre-existing diabetes mellitus in pregnancy, unspecified trimester: Secondary | ICD-10-CM

## 2021-10-15 DIAGNOSIS — O0990 Supervision of high risk pregnancy, unspecified, unspecified trimester: Secondary | ICD-10-CM | POA: Diagnosis present

## 2021-10-15 DIAGNOSIS — O285 Abnormal chromosomal and genetic finding on antenatal screening of mother: Secondary | ICD-10-CM

## 2021-10-15 DIAGNOSIS — O329XX1 Maternal care for malpresentation of fetus, unspecified, fetus 1: Secondary | ICD-10-CM

## 2021-10-15 DIAGNOSIS — O280 Abnormal hematological finding on antenatal screening of mother: Secondary | ICD-10-CM | POA: Diagnosis not present

## 2021-10-15 DIAGNOSIS — O99213 Obesity complicating pregnancy, third trimester: Secondary | ICD-10-CM | POA: Diagnosis present

## 2021-10-15 DIAGNOSIS — E669 Obesity, unspecified: Secondary | ICD-10-CM

## 2021-10-15 DIAGNOSIS — R772 Abnormality of alphafetoprotein: Secondary | ICD-10-CM

## 2021-10-15 DIAGNOSIS — O98419 Viral hepatitis complicating pregnancy, unspecified trimester: Secondary | ICD-10-CM | POA: Diagnosis present

## 2021-10-15 DIAGNOSIS — Z794 Long term (current) use of insulin: Secondary | ICD-10-CM

## 2021-10-15 DIAGNOSIS — O98413 Viral hepatitis complicating pregnancy, third trimester: Secondary | ICD-10-CM

## 2021-10-15 NOTE — Patient Instructions (Signed)

## 2021-10-15 NOTE — Progress Notes (Signed)
   Subjective:  Crystal Castillo is a 31 y.o. G2P1001 at [redacted]w[redacted]d being seen today for ongoing prenatal care.  She is currently monitored for the following issues for this high-risk pregnancy and has Major depressive disorder, recurrent severe without psychotic features ([name]); Borderline personality disorder ([name]); Maternal morbid obesity, antepartum ([name]); Pre-existing insulin treated diabetes mellitus during pregnancy ([name]); Drug abuse ([name]); Type 2 diabetes mellitus without complication ([name]); Supervision of high risk pregnancy, antepartum; Hepatitis C infection; Dichorionic diamniotic twin pregnancy; Abnormal genetic test during pregnancy; Cirrhosis ([name]); Vaccine counseling; BMI 45.0-49.9, adult ([name]); and Malpresentation before onset of labor on their problem list.  Patient reports no complaints.  Contractions: Not present. Vag. Bleeding: None.  Movement: Present. Denies leaking of fluid.   The following portions of the patient's history were reviewed and updated as appropriate: allergies, current medications, past family history, past medical history, past social history, past surgical history and problem list. Problem list updated.  Objective:   Vitals:   10/15/21 1453  BP: 110/74  Pulse: 96  Weight: (!) 303 lb 12.8 oz (137.8 kg)    Fetal Status: Fetal Heart Rate (bpm): 145/125   Movement: Present     General:  Alert, oriented and cooperative. Patient is in no acute distress.  Skin: Skin is warm and dry. No rash noted.   Cardiovascular: Normal heart rate noted  Respiratory: Normal respiratory effort, no problems with respiration noted  Abdomen: Soft, gravid, appropriate for gestational age. Pain/Pressure: Absent     Pelvic: Vag. Bleeding: None     Cervical exam deferred        Extremities: Normal range of motion.  Edema: None  Mental Status: Normal mood and affect. Normal behavior. Normal judgment and thought content.   Urinalysis:      Assessment and Plan:  Pregnancy: G2P1001 at  [redacted]w[redacted]d  1. Supervision of high risk pregnancy, antepartum BP and FHRs normal Swabs collected today Reports some diarrhea, feeling nauseous and vaguely run down, encouraged to take COVID test but overall sounds c/w viral illness, routine symptomatic treatment - Culture, beta strep (group b only) - GC/Chlamydia probe amp ([place])not at [facility]  2. Pre-existing insulin treated diabetes mellitus during pregnancy ([name]) Reports sugars have been well controlled Basal 2 u/hr, bolus 1u/5g of carbs calculated, sometimes boluses with pump and sometimes with pens  3. Chronic hepatitis C without hepatic coma ([name]) Plan for referral and treatment once she is done breastfeeding postpartum  4. Dichorionic diamniotic twin pregnancy in third trimester Following closely w MFM Normal growth and discordance on last [place] Had [place] earlier today, BPP 8/8 for both twins Plan for delivery on 10/22/2021 at 37 weeks per MFM  5. Malpresentation before onset of labor, fetus 1 of multiple gestation Breech/breech on [place] today, already scheduled for cesarean delivery   Preterm labor symptoms and general obstetric precautions including but not limited to vaginal bleeding, contractions, leaking of fluid and fetal movement were reviewed in detail with the patient. Please refer to After Visit Summary for other counseling recommendations.  Return in 7 weeks (on 12/03/2021) for PP check.   Venora Maples, MD

## 2021-10-16 LAB — GC/CHLAMYDIA PROBE AMP (~~LOC~~) NOT AT ARMC
Chlamydia: NEGATIVE
Comment: NEGATIVE
Comment: NORMAL
Neisseria Gonorrhea: NEGATIVE

## 2021-10-19 LAB — CULTURE, BETA STREP (GROUP B ONLY): Strep Gp B Culture: NEGATIVE

## 2021-10-21 ENCOUNTER — Ambulatory Visit (HOSPITAL_COMMUNITY)
Admission: RE | Admit: 2021-10-21 | Discharge: 2021-10-21 | Disposition: A | Discharge status: Home / Self Care | Payer: Medicaid Other | Source: Ambulatory Visit | Attending: Obstetrics and Gynecology | Admitting: Obstetrics and Gynecology

## 2021-10-21 DIAGNOSIS — Z3A00 Weeks of gestation of pregnancy not specified: Secondary | ICD-10-CM | POA: Insufficient documentation

## 2021-10-21 DIAGNOSIS — O329XX1 Maternal care for malpresentation of fetus, unspecified, fetus 1: Secondary | ICD-10-CM | POA: Insufficient documentation

## 2021-10-21 LAB — TYPE AND SCREEN
ABO/RH(D): A POS
Antibody Screen: NEGATIVE

## 2021-10-21 LAB — CBC
HCT: 36.3 % (ref 36.0–46.0)
Hemoglobin: 12.3 g/dL (ref 12.0–15.0)
MCH: 31.8 pg (ref 26.0–34.0)
MCHC: 33.9 g/dL (ref 30.0–36.0)
MCV: 93.8 fL (ref 80.0–100.0)
Platelets: 193 10*3/uL (ref 150–400)
RBC: 3.87 MIL/uL (ref 3.87–5.11)
RDW: 13.6 % (ref 11.5–15.5)
WBC: 10.2 10*3/uL (ref 4.0–10.5)
nRBC: 0 % (ref 0.0–0.2)

## 2021-10-21 LAB — RPR: RPR Ser Ql: NONREACTIVE

## 2021-10-22 ENCOUNTER — Encounter (HOSPITAL_COMMUNITY)
Admission: RE | Disposition: A | Discharge status: Home / Self Care | Payer: Self-pay | Source: Home / Self Care | Attending: Obstetrics and Gynecology

## 2021-10-22 ENCOUNTER — Inpatient Hospital Stay (HOSPITAL_COMMUNITY): Payer: Medicaid Other | Admitting: Certified Registered Nurse Anesthetist

## 2021-10-22 ENCOUNTER — Other Ambulatory Visit: Payer: Self-pay

## 2021-10-22 ENCOUNTER — Inpatient Hospital Stay (HOSPITAL_COMMUNITY)
Admission: RE | Admit: 2021-10-22 | Discharge: 2021-10-25 | DRG: 783 | Disposition: A | Discharge status: Home / Self Care | Payer: Medicaid Other | Attending: Obstetrics and Gynecology | Admitting: Obstetrics and Gynecology

## 2021-10-22 ENCOUNTER — Encounter: Payer: Self-pay | Admitting: Family Medicine

## 2021-10-22 ENCOUNTER — Encounter (HOSPITAL_COMMUNITY): Payer: Self-pay | Admitting: Obstetrics and Gynecology

## 2021-10-22 DIAGNOSIS — Z302 Encounter for sterilization: Secondary | ICD-10-CM

## 2021-10-22 DIAGNOSIS — Z7982 Long term (current) use of aspirin: Secondary | ICD-10-CM | POA: Diagnosis not present

## 2021-10-22 DIAGNOSIS — O321XX1 Maternal care for breech presentation, fetus 1: Secondary | ICD-10-CM | POA: Diagnosis not present

## 2021-10-22 DIAGNOSIS — Z98891 History of uterine scar from previous surgery: Principal | ICD-10-CM

## 2021-10-22 DIAGNOSIS — Z794 Long term (current) use of insulin: Secondary | ICD-10-CM | POA: Diagnosis not present

## 2021-10-22 DIAGNOSIS — O2492 Unspecified diabetes mellitus in childbirth: Secondary | ICD-10-CM

## 2021-10-22 DIAGNOSIS — Z3A37 37 weeks gestation of pregnancy: Secondary | ICD-10-CM

## 2021-10-22 DIAGNOSIS — Z87891 Personal history of nicotine dependence: Secondary | ICD-10-CM | POA: Diagnosis not present

## 2021-10-22 DIAGNOSIS — O99214 Obesity complicating childbirth: Secondary | ICD-10-CM | POA: Diagnosis present

## 2021-10-22 DIAGNOSIS — O30003 Twin pregnancy, unspecified number of placenta and unspecified number of amniotic sacs, third trimester: Secondary | ICD-10-CM | POA: Diagnosis present

## 2021-10-22 DIAGNOSIS — Z23 Encounter for immunization: Secondary | ICD-10-CM | POA: Diagnosis not present

## 2021-10-22 DIAGNOSIS — B1920 Unspecified viral hepatitis C without hepatic coma: Secondary | ICD-10-CM | POA: Diagnosis present

## 2021-10-22 DIAGNOSIS — O9902 Anemia complicating childbirth: Secondary | ICD-10-CM | POA: Diagnosis present

## 2021-10-22 DIAGNOSIS — O24319 Unspecified pre-existing diabetes mellitus in pregnancy, unspecified trimester: Secondary | ICD-10-CM

## 2021-10-22 DIAGNOSIS — E11649 Type 2 diabetes mellitus with hypoglycemia without coma: Secondary | ICD-10-CM | POA: Diagnosis present

## 2021-10-22 DIAGNOSIS — O30043 Twin pregnancy, dichorionic/diamniotic, third trimester: Secondary | ICD-10-CM | POA: Diagnosis present

## 2021-10-22 DIAGNOSIS — O0990 Supervision of high risk pregnancy, unspecified, unspecified trimester: Secondary | ICD-10-CM

## 2021-10-22 DIAGNOSIS — O9842 Viral hepatitis complicating childbirth: Secondary | ICD-10-CM | POA: Diagnosis present

## 2021-10-22 DIAGNOSIS — O2412 Pre-existing diabetes mellitus, type 2, in childbirth: Secondary | ICD-10-CM | POA: Diagnosis present

## 2021-10-22 DIAGNOSIS — E119 Type 2 diabetes mellitus without complications: Secondary | ICD-10-CM

## 2021-10-22 LAB — RAPID URINE DRUG SCREEN, HOSP PERFORMED
Amphetamines: NOT DETECTED
Barbiturates: NOT DETECTED
Benzodiazepines: NOT DETECTED
Cocaine: NOT DETECTED
Opiates: NOT DETECTED
Tetrahydrocannabinol: NOT DETECTED

## 2021-10-22 LAB — GLUCOSE, CAPILLARY
Glucose-Capillary: 79 mg/dL (ref 70–99)
Glucose-Capillary: 93 mg/dL (ref 70–99)
Glucose-Capillary: 85 mg/dL (ref 70–99)
Glucose-Capillary: 125 mg/dL — ABNORMAL HIGH (ref 70–99)

## 2021-10-22 SURGERY — Surgical Case
Anesthesia: Spinal

## 2021-10-22 MED ORDER — SIMETHICONE 80 MG PO CHEW: 80.0000 mg | CHEWABLE_TABLET | ORAL | Status: DC | PRN

## 2021-10-22 MED ORDER — ONDANSETRON HCL 4 MG/2ML IJ SOLN
INTRAMUSCULAR | Status: AC
  Filled 2021-10-22: qty 2

## 2021-10-22 MED ORDER — WITCH HAZEL-GLYCERIN EX PADS: 1.0000 | MEDICATED_PAD | CUTANEOUS | Status: DC | PRN

## 2021-10-22 MED ORDER — FENTANYL CITRATE (PF) 100 MCG/2ML IJ SOLN
INTRAMUSCULAR | Status: DC | PRN
  Administered 2021-10-22: 15 ug via INTRATHECAL

## 2021-10-22 MED ORDER — SODIUM CHLORIDE 0.9 % IR SOLN
Status: DC | PRN
  Administered 2021-10-22: 1

## 2021-10-22 MED ORDER — MENTHOL 3 MG MT LOZG: 1.0000 | LOZENGE | OROMUCOSAL | Status: DC | PRN

## 2021-10-22 MED ORDER — ENOXAPARIN SODIUM 80 MG/0.8ML IJ SOSY
0.5000 mg/kg | PREFILLED_SYRINGE | INTRAMUSCULAR | Status: DC
  Administered 2021-10-23 – 2021-10-25 (×3): 70 mg via SUBCUTANEOUS
  Filled 2021-10-22 (×3): qty 0.8

## 2021-10-22 MED ORDER — PRENATAL MULTIVITAMIN CH
1.0000 | ORAL_TABLET | Freq: Every day | ORAL | Status: DC
  Administered 2021-10-23 – 2021-10-25 (×3): 1 via ORAL
  Filled 2021-10-22 (×3): qty 1

## 2021-10-22 MED ORDER — MEASLES, MUMPS & RUBELLA VAC IJ SOLR: 0.5000 mL | Freq: Once | INTRAMUSCULAR | Status: DC

## 2021-10-22 MED ORDER — MORPHINE SULFATE (PF) 0.5 MG/ML IJ SOLN
INTRAMUSCULAR | Status: AC
  Filled 2021-10-22: qty 10

## 2021-10-22 MED ORDER — SCOPOLAMINE 1 MG/3DAYS TD PT72
1.0000 | MEDICATED_PATCH | Freq: Once | TRANSDERMAL | Status: AC
  Administered 2021-10-22: 1.5 mg via TRANSDERMAL

## 2021-10-22 MED ORDER — FENTANYL CITRATE (PF) 100 MCG/2ML IJ SOLN
INTRAMUSCULAR | Status: AC
  Filled 2021-10-22: qty 2

## 2021-10-22 MED ORDER — FENTANYL CITRATE (PF) 100 MCG/2ML IJ SOLN
25.0000 ug | INTRAMUSCULAR | Status: DC | PRN
  Administered 2021-10-22: 50 ug via INTRAVENOUS

## 2021-10-22 MED ORDER — KETOROLAC TROMETHAMINE 30 MG/ML IJ SOLN
INTRAMUSCULAR | Status: AC
  Filled 2021-10-22: qty 1

## 2021-10-22 MED ORDER — SIMETHICONE 80 MG PO CHEW
80.0000 mg | CHEWABLE_TABLET | Freq: Three times a day (TID) | ORAL | Status: DC
  Administered 2021-10-23 – 2021-10-25 (×7): 80 mg via ORAL
  Filled 2021-10-22 (×7): qty 1

## 2021-10-22 MED ORDER — PHENYLEPHRINE HCL (PRESSORS) 10 MG/ML IV SOLN
INTRAVENOUS | Status: DC | PRN
  Administered 2021-10-22 (×2): 160 ug via INTRAVENOUS
  Administered 2021-10-22 (×2): 80 ug via INTRAVENOUS

## 2021-10-22 MED ORDER — OXYTOCIN-SODIUM CHLORIDE 30-0.9 UT/500ML-% IV SOLN
2.5000 [IU]/h | INTRAVENOUS | Status: AC
  Administered 2021-10-22: 2.5 [IU]/h via INTRAVENOUS
  Filled 2021-10-22: qty 500

## 2021-10-22 MED ORDER — SODIUM CHLORIDE 0.9% FLUSH: 3.0000 mL | INTRAVENOUS | Status: DC | PRN

## 2021-10-22 MED ORDER — OXYTOCIN-SODIUM CHLORIDE 30-0.9 UT/500ML-% IV SOLN
INTRAVENOUS | Status: AC
  Filled 2021-10-22: qty 500

## 2021-10-22 MED ORDER — OXYCODONE HCL 5 MG PO TABS
5.0000 mg | ORAL_TABLET | ORAL | Status: DC | PRN
  Administered 2021-10-23 – 2021-10-24 (×3): 5 mg via ORAL
  Filled 2021-10-22 (×3): qty 1

## 2021-10-22 MED ORDER — CEFAZOLIN IN SODIUM CHLORIDE 3-0.9 GM/100ML-% IV SOLN
INTRAVENOUS | Status: AC
  Filled 2021-10-22: qty 100

## 2021-10-22 MED ORDER — STERILE WATER FOR IRRIGATION IR SOLN
Status: DC | PRN
  Administered 2021-10-22: 1000 mL

## 2021-10-22 MED ORDER — SODIUM CHLORIDE 0.9 % IV SOLN
12.5000 mg | Freq: Once | INTRAVENOUS | Status: AC
Start: 2021-10-22 — End: 2021-10-22
  Administered 2021-10-22: 12.5 mg via INTRAVENOUS
  Filled 2021-10-22 (×2): qty 0.5

## 2021-10-22 MED ORDER — DIPHENHYDRAMINE HCL 25 MG PO CAPS: 25.0000 mg | ORAL_CAPSULE | ORAL | Status: DC | PRN

## 2021-10-22 MED ORDER — LACTATED RINGERS IV SOLN: INTRAVENOUS | Status: DC

## 2021-10-22 MED ORDER — TRANEXAMIC ACID-NACL 1000-0.7 MG/100ML-% IV SOLN
INTRAVENOUS | Status: DC | PRN
  Administered 2021-10-22: 1000 mg via INTRAVENOUS

## 2021-10-22 MED ORDER — ONDANSETRON HCL 4 MG/2ML IJ SOLN
INTRAMUSCULAR | Status: DC | PRN
  Administered 2021-10-22: 4 mg via INTRAVENOUS

## 2021-10-22 MED ORDER — DIPHENHYDRAMINE HCL 50 MG/ML IJ SOLN: 12.5000 mg | INTRAMUSCULAR | Status: DC | PRN

## 2021-10-22 MED ORDER — METFORMIN HCL ER 500 MG PO TB24
1000.0000 mg | ORAL_TABLET | Freq: Two times a day (BID) | ORAL | Status: DC
  Administered 2021-10-22 – 2021-10-25 (×6): 1000 mg via ORAL
  Filled 2021-10-22 (×8): qty 2

## 2021-10-22 MED ORDER — SENNOSIDES-DOCUSATE SODIUM 8.6-50 MG PO TABS
2.0000 | ORAL_TABLET | ORAL | Status: DC
  Administered 2021-10-23 – 2021-10-25 (×3): 2 via ORAL
  Filled 2021-10-22 (×3): qty 2

## 2021-10-22 MED ORDER — ZOLPIDEM TARTRATE 5 MG PO TABS: 5.0000 mg | ORAL_TABLET | Freq: Every evening | ORAL | Status: DC | PRN

## 2021-10-22 MED ORDER — COCONUT OIL OIL: 1.0000 | TOPICAL_OIL | Status: DC | PRN

## 2021-10-22 MED ORDER — INSULIN GLARGINE-YFGN 100 UNIT/ML ~~LOC~~ SOLN
10.0000 [IU] | Freq: Every day | SUBCUTANEOUS | Status: DC
  Administered 2021-10-22 – 2021-10-24 (×3): 10 [IU] via SUBCUTANEOUS
  Filled 2021-10-22 (×4): qty 0.1

## 2021-10-22 MED ORDER — PHENYLEPHRINE 80 MCG/ML (10ML) SYRINGE FOR IV PUSH (FOR BLOOD PRESSURE SUPPORT)
PREFILLED_SYRINGE | INTRAVENOUS | Status: AC
  Filled 2021-10-22: qty 10

## 2021-10-22 MED ORDER — DIBUCAINE (PERIANAL) 1 % EX OINT
1.0000 | TOPICAL_OINTMENT | CUTANEOUS | Status: DC | PRN
Start: 2021-10-22 — End: 2021-10-25

## 2021-10-22 MED ORDER — OXYTOCIN-SODIUM CHLORIDE 30-0.9 UT/500ML-% IV SOLN
INTRAVENOUS | Status: DC | PRN
  Administered 2021-10-22: 300 mL via INTRAVENOUS

## 2021-10-22 MED ORDER — ACETAMINOPHEN 500 MG PO TABS
1000.0000 mg | ORAL_TABLET | Freq: Four times a day (QID) | ORAL | Status: DC
  Administered 2021-10-22 – 2021-10-25 (×12): 1000 mg via ORAL
  Filled 2021-10-22 (×12): qty 2

## 2021-10-22 MED ORDER — IBUPROFEN 600 MG PO TABS
600.0000 mg | ORAL_TABLET | Freq: Four times a day (QID) | ORAL | Status: AC
  Administered 2021-10-23 – 2021-10-25 (×9): 600 mg via ORAL
  Filled 2021-10-22 (×9): qty 1

## 2021-10-22 MED ORDER — KETOROLAC TROMETHAMINE 30 MG/ML IJ SOLN: 30.0000 mg | Freq: Four times a day (QID) | INTRAMUSCULAR | Status: AC | PRN

## 2021-10-22 MED ORDER — KETOROLAC TROMETHAMINE 30 MG/ML IJ SOLN
30.0000 mg | Freq: Four times a day (QID) | INTRAMUSCULAR | Status: AC | PRN
  Administered 2021-10-22 – 2021-10-23 (×3): 30 mg via INTRAVENOUS
  Filled 2021-10-22 (×2): qty 1

## 2021-10-22 MED ORDER — CEFAZOLIN IN SODIUM CHLORIDE 3-0.9 GM/100ML-% IV SOLN
3.0000 g | INTRAVENOUS | Status: AC
  Administered 2021-10-22: 3 g via INTRAVENOUS

## 2021-10-22 MED ORDER — NALOXONE HCL 0.4 MG/ML IJ SOLN: 0.4000 mg | INTRAMUSCULAR | Status: DC | PRN

## 2021-10-22 MED ORDER — SCOPOLAMINE 1 MG/3DAYS TD PT72
MEDICATED_PATCH | TRANSDERMAL | Status: AC
  Filled 2021-10-22: qty 1

## 2021-10-22 MED ORDER — MORPHINE SULFATE (PF) 0.5 MG/ML IJ SOLN
INTRAMUSCULAR | Status: DC | PRN
  Administered 2021-10-22: .15 mg via INTRATHECAL

## 2021-10-22 MED ORDER — BUPIVACAINE IN DEXTROSE 0.75-8.25 % IT SOLN
INTRATHECAL | Status: DC | PRN
  Administered 2021-10-22: 1.8 mL via INTRATHECAL

## 2021-10-22 MED ORDER — NALOXONE HCL 4 MG/10ML IJ SOLN: 1.0000 ug/kg/h | INTRAVENOUS | Status: DC | PRN

## 2021-10-22 MED ORDER — PHENYLEPHRINE HCL-NACL 20-0.9 MG/250ML-% IV SOLN
INTRAVENOUS | Status: DC | PRN
  Administered 2021-10-22: 60 ug/min via INTRAVENOUS

## 2021-10-22 MED ORDER — DIPHENHYDRAMINE HCL 25 MG PO CAPS: 25.0000 mg | ORAL_CAPSULE | Freq: Four times a day (QID) | ORAL | Status: DC | PRN

## 2021-10-22 MED ORDER — ONDANSETRON HCL 4 MG/2ML IJ SOLN
4.0000 mg | Freq: Three times a day (TID) | INTRAMUSCULAR | Status: DC | PRN
  Administered 2021-10-22: 4 mg via INTRAVENOUS

## 2021-10-22 MED ORDER — POVIDONE-IODINE 10 % EX SWAB: 2.0000 | Freq: Once | CUTANEOUS | Status: DC

## 2021-10-22 SURGICAL SUPPLY — 40 items
APL SKNCLS STERI-STRIP NONHPOA (GAUZE/BANDAGES/DRESSINGS) ×1
BENZOIN TINCTURE PRP APPL 2/3 (GAUZE/BANDAGES/DRESSINGS) ×1 IMPLANT
CHLORAPREP W/TINT 26ML (MISCELLANEOUS) ×2 IMPLANT
CLAMP CORD UMBIL (MISCELLANEOUS) ×2 IMPLANT
CLOTH BEACON ORANGE TIMEOUT ST (SAFETY) ×1 IMPLANT
DRESSING PREVENA PLUS CUSTOM (GAUZE/BANDAGES/DRESSINGS) IMPLANT
DRSG OPSITE POSTOP 4X10 (GAUZE/BANDAGES/DRESSINGS) ×1 IMPLANT
DRSG PREVENA PLUS CUSTOM (GAUZE/BANDAGES/DRESSINGS) ×1
ELECT REM PT RETURN 9FT ADLT (ELECTROSURGICAL) ×1
ELECTRODE REM PT RTRN 9FT ADLT (ELECTROSURGICAL) ×1 IMPLANT
EXTRACTOR VACUUM M CUP 4 TUBE (SUCTIONS) IMPLANT
GLOVE BIOGEL PI IND STRL 7.0 (GLOVE) IMPLANT
GLOVE BIOGEL PI IND STRL 7.5 (GLOVE) ×2 IMPLANT
GLOVE ECLIPSE 7.5 STRL STRAW (GLOVE) ×1 IMPLANT
GOWN STRL REUS W/TWL LRG LVL3 (GOWN DISPOSABLE) ×3 IMPLANT
KIT ABG SYR 3ML LUER SLIP (SYRINGE) IMPLANT
MAT PREVALON FULL STRYKER (MISCELLANEOUS) IMPLANT
NDL HYPO 25X5/8 SAFETYGLIDE (NEEDLE) IMPLANT
NEEDLE HYPO 25X5/8 SAFETYGLIDE (NEEDLE) IMPLANT
NS IRRIG 1000ML POUR BTL (IV SOLUTION) ×1 IMPLANT
PACK C SECTION WH (CUSTOM PROCEDURE TRAY) ×1 IMPLANT
PAD OB MATERNITY 4.3X12.25 (PERSONAL CARE ITEMS) ×1 IMPLANT
PENCIL SMOKE EVACUATOR (MISCELLANEOUS) IMPLANT
RETAINER VISCERAL (MISCELLANEOUS) IMPLANT
RETRACTOR TRAXI PANNICULUS (MISCELLANEOUS) IMPLANT
RTRCTR C-SECT PINK 25CM LRG (MISCELLANEOUS) ×1 IMPLANT
STRIP CLOSURE SKIN 1/2X4 (GAUZE/BANDAGES/DRESSINGS) ×1 IMPLANT
SUT PLAIN 0 NONE (SUTURE) ×1 IMPLANT
SUT PLAIN 2 0 XLH (SUTURE) IMPLANT
SUT VIC AB 0 CT1 36 (SUTURE) ×1 IMPLANT
SUT VIC AB 0 CTX 36 (SUTURE) ×2
SUT VIC AB 0 CTX36XBRD ANBCTRL (SUTURE) IMPLANT
SUT VIC AB 2-0 CT1 (SUTURE) ×2 IMPLANT
SUT VIC AB 2-0 CT1 27 (SUTURE) ×1
SUT VIC AB 2-0 CT1 TAPERPNT 27 (SUTURE) ×1 IMPLANT
SUT VIC AB 4-0 KS 27 (SUTURE) ×1 IMPLANT
TOWEL OR 17X24 6PK STRL BLUE (TOWEL DISPOSABLE) ×1 IMPLANT
TRAXI PANNICULUS RETRACTOR (MISCELLANEOUS) ×1
TRAY FOLEY W/BAG SLVR 14FR LF (SET/KITS/TRAYS/PACK) ×1 IMPLANT
WATER STERILE IRR 1000ML POUR (IV SOLUTION) ×1 IMPLANT

## 2021-10-22 NOTE — Transfer of Care (Signed)
Immediate Anesthesia Transfer of Care Note  Patient: Crystal Castillo  Procedure(s) Performed: CESAREAN SECTION MULTI-GESTATIONAL WITH TUBAL  Patient Location: PACU  Anesthesia Type:Spinal  Level of Consciousness: awake, alert  and oriented  Airway & Oxygen Therapy: Patient Spontanous Breathing  Post-op Assessment: Report given to RN and Post -op Vital signs reviewed and stable  Post vital signs: Reviewed and stable  Last Vitals:  Vitals Value Taken Time  BP 128/62 10/22/21 1201  Temp    Pulse 71 10/22/21 1204  Resp 20 10/22/21 1204  SpO2 99 % 10/22/21 1204  Vitals shown include unvalidated device data.  Last Pain:  Vitals:   10/22/21 0751  TempSrc: (P) Oral         Complications: No notable events documented.

## 2021-10-22 NOTE — Lactation Note (Signed)
This note was copied from a baby's chart. Lactation Consultation Note  Patient Name: Crystal Castillo [id number] Date: 10/22/2021 Reason for consult: Initial assessment;Early term 37-38.6wks;Infant < 6lbs;Multiple gestation Age:29 hours   P3: Early term twins born at 37+2 weeks Feeding preference: Breast Type 2 DM  Visited with birth parent per support person's approval; birth parent feeling sick earlier and now is feeling much better after receiving IV Phenergan.  Spoke with RN prior to visiting.  Breast feeding basics reviewed.  Birth parent's feeding goal is to breast feed and/or breast feed and bottle feed her expressed milk for one year.  She was diagnosed with Hepatitis C during this pregnancy and her doctor informed her that her treatment can wait for a year in order to breast feed.  Birth parent has realistic expectations and, if the exclusive breast feeding does not work out for her and the family, she will consider other options.  Baby A: (Crystal Castillo) 2560 grams               Blood sugar readings have been 37 mg/dl, 35 mg/dl and 50 mg/dl               Glucose gel given and supplemented with donor breast milk  Baby B: Crystal Castillo) 2450 grams               Blood sugar readings have been 38 mg/dl, 53 mg/dl and 46 mg/dl               Glucose gel given and supplemented with donor breast milk  Late preterm infant policy reviewed and crib cards placed by RN.  Reviewed feeding plan to include latching if interested for 5-10 minutes until blood sugars are stable.  Parents will supplement with donor breast milk per guidelines and more if the babies are interested.  Offered to initiate the electric pump; birth parent receptive.  Birth parent comfortable using the #24 flanges at this time.  Observed her pumping for approximately 10 minutes before her daughter and other family members arrived to visit.  Birth parent desiresd to stop pumping and resume after they leave.  Acknowledged her request.  She was  able to obtain 1 ml; encouraged finger feeding drops.  RN in room and updated.   Maternal Data Has patient been taught Hand Expression?: Yes Does the patient have breastfeeding experience prior to this delivery?: Yes How long did the patient breastfeed?: 18 months with her first child  Feeding Mother's Current Feeding Choice: Breast Milk  LATCH Score                    Lactation Tools Discussed/Used Tools: Pump;Flanges Flange Size: 24 Breast pump type: Double-Electric Breast Pump;Manual Pump Education: Setup, frequency, and cleaning;Milk Storage Reason for Pumping: Breast stimulation for supplementation; twins < 6 lbs Pumping frequency: Every three hours Pumped volume: 1 mL  Interventions Interventions: Breast feeding basics reviewed;Hand express;Expressed milk;Hand pump;DEBP;Education;LC Services brochure;LPT handout/interventions  Discharge Pump: Personal;Manual (Spectra)  Consult Status Consult Status: Follow-up Date: 10/23/21 Follow-up type: In-patient    Pawan Knechtel R Faron Tudisco 10/22/2021, 5:13 PM

## 2021-10-22 NOTE — Progress Notes (Signed)
Inpatient Diabetes Program Recommendations  AACE/ADA: New Consensus Statement on Inpatient Glycemic Control (2015)  Target Ranges:  Prepandial:   less than 140 mg/dL      Peak postprandial:   less than 180 mg/dL (1-2 hours)      Critically ill patients:  140 - 180 mg/dL   Lab Results  Component Value Date   GLUCAP 93 10/22/2021   HGBA1C 6.0 (H) 04/10/2021    Review of Glycemic Control  Latest Reference Range & Units 10/22/21 07:42 10/22/21 12:09  Glucose-Capillary 70 - 99 mg/dL 79 93  Diabetes history:  DM 2 Outpatient Diabetes medications:  Omnipod insulin pump with Dexcom Basal insulin- 2 units/ hr Bolus- 1 unit/5 grams Current orders for Inpatient glycemic control:  Semglee 10 units q HS Metformin XR 1000 mg bid Inpatient Diabetes Program Recommendations:    Spoke to patient regarding DM. Patient has Omnipod on and states that it is suspended.   Patient to take pod off. She is wearing a Dexcom CGM sensor and can monitor blood sugars frequently.  Encouraged her to let RN know if blood sugars raise >150 mg/dL.  Will follow.   Thanks,  Beryl Meager, RN, BC-ADM Inpatient Diabetes Coordinator Pager [phone]  (8a-5p)

## 2021-10-22 NOTE — Anesthesia Preprocedure Evaluation (Signed)
Anesthesia Evaluation  Patient identified by MRN, date of birth, ID band Patient awake    Reviewed: Allergy & Precautions, NPO status , Patient's Chart, lab work & pertinent test results  Airway Mallampati: III  TM Distance: >3 FB Neck ROM: Full    Dental no notable dental hx.    Pulmonary neg pulmonary ROS, former smoker,    Pulmonary exam normal breath sounds clear to auscultation       Cardiovascular negative cardio ROS Normal cardiovascular exam Rhythm:Regular Rate:Normal     Neuro/Psych PSYCHIATRIC DISORDERS Anxiety Depression negative neurological ROS     GI/Hepatic negative GI ROS, (+) Cirrhosis       , Hepatitis -, C  Endo/Other  diabetes, Type 2, Insulin DependentMorbid obesity (BMI 48)  Renal/GU negative Renal ROS  negative genitourinary   Musculoskeletal negative musculoskeletal ROS (+)   Abdominal   Peds  Hematology negative hematology ROS (+)   Anesthesia Other Findings Primary C/S for breech  Reproductive/Obstetrics (+) Pregnancy                             Anesthesia Physical Anesthesia Plan  ASA: 3  Anesthesia Plan: Spinal   Post-op Pain Management:    Induction:   PONV Risk Score and Plan: Treatment may vary due to age or medical condition  Airway Management Planned: Natural Airway  Additional Equipment:   Intra-op Plan:   Post-operative Plan:   Informed Consent: I have reviewed the patients History and Physical, chart, labs and discussed the procedure including the risks, benefits and alternatives for the proposed anesthesia with the patient or authorized representative who has indicated his/her understanding and acceptance.     Dental advisory given  Plan Discussed with: CRNA  Anesthesia Plan Comments:         Anesthesia Quick Evaluation

## 2021-10-22 NOTE — Op Note (Signed)
Cesarean Section Operative Report  Crystal Castillo  10/22/2021  Indications: twin gestation, breech presentation fetus A, undesired fertility  Pre-operative Diagnosis: breech presentation, di-di twin gestation, primary low transverse cesarean section, bilateral salpingectomy  Post-operative Diagnosis: Same   Surgeon: Surgeon(s) and Role:    * Gillis Boardley, Wilfred Curtis, MD - Primary    * Autry-Lott, Randa Evens, DO - Assisting   Attending Attestation: I was present and scrubbed for the entire procedure.   An experienced assistant was required given the standard of surgical care given the complexity of the case.  This assistant was needed for exposure, dissection, suctioning, retraction, instrument exchange, assisting with delivery with administration of fundal pressure, and for overall help during the procedure.  Anesthesia: spinal    Quantified Blood Loss: 934 ml  Total IV Fluids: 2300 ml LR  Urine Output:: 150 ml clear yellow urine  Specimens: none  Findings: Viable female infant A in breech presentation, viable female infant B in cephalic presentation; Apgars pending; weight pending; arterial cord pH not obtain;  clear amniotic fluid x2; intact placentas with three vessel cord; normal uterus, fallopian tubes and ovaries bilaterally.  Baby condition / location:  Couplet care / Skin to Skin   Complications: no complications  Indications: Crystal Castillo is a 31 y.o. [phone] with an IUP [redacted]w[redacted]d presenting for scheduled primary c section for di-di twin gestation, breech presentation baby a..  The risks, benefits, complications, treatment options, and exected outcomes were discussed with the patient . The patient dwith the proposed plan, giving informed consent. identified as Crystal Castillo and the procedure verified as C-Section Delivery.  Procedure Details:  The patient was taken back to the operative suite where spinal anesthesia was placed.  A time out was held and the above information  confirmed.   After induction of anesthesia, the patient was draped and prepped in the usual sterile manner and placed in a dorsal supine position with a leftward tilt. A Pfannenstiel incision was made and carried down through the subcutaneous tissue to the fascia. Fascial incision was made and bluntly extended transversely. The fascia was separated from the underlying rectus tissue superiorly and inferiorly. The peritoneum was identified and bluntly entered and extended longitudinally. Alexis retractor was placed. A bladder flap was not created. A low transverse uterine incision was made and extended bluntly. Delivered from breech presentation was a viable infant with Apgars and weight as above.  After waiting 60 seconds for delayed cord cutting, the umbilical cord was clamped and cut cord blood was obtained for evaluation. Cord ph was not sent. AROM then performed for fetus b and delivered from the cephalic position in standard fashion was a viable female infant. After waiting 60 seconds for delayed cord cutting, the umbilical cord was clamped and cut and corb blood obtained for evaluation. No cord ph was sent. The placentas were removed Intact and appeared normal. The uterine outline, tubes and ovaries appeared normal. The uterine incision was closed with running unlocked sutures 0-Vicryl in one layer.   Hemostasis was observed.   Attention was then turned to the right fallopian tube. Kelly forceps were placed on the mesosalpinx underneath most of the tube.  This pedicle was double suture ligated with 2-0 plain gut, and the tube including the fimbriated end was excised.  The left fallopian tube was then identified, doubly ligated, and was excised in a similar fashion allowing for bilateral tubal sterilization via bilateral salpingectomy.  Good hemostasis was noted overall.  The peritoneum was closed  with 2-0 vicryl. The rectus muscles were examined and hemostasis observed. The fascia was then reapproximated  with running sutures of 0-Vicryl. The subcuticular closure was performed with 2-0 plain gut. The skin was closed with 4-0 Vicryl. Prevent wound vac was applied.  Instrument, sponge, and needle counts were correct prior the abdominal closure and were correct at the conclusion of the case.     Disposition: PACU - hemodynamically stable.   Maternal Condition: stable       Signed: Lavonne Chick 10/22/2021 2:39 PM

## 2021-10-22 NOTE — Progress Notes (Signed)
Patient had been very nauseated and vomiting since arrival at 1330. She has felt "uncomfortable and sweating a lot" with it.  Obtained order for IV antiemetics, infusion completed.  So far, seems to be relieving her symptoms.

## 2021-10-22 NOTE — Anesthesia Procedure Notes (Signed)
Spinal  Patient location during procedure: OR Start time: 10/22/2021 9:50 AM End time: 10/22/2021 9:53 AM Reason for block: surgical anesthesia Staffing Performed: anesthesiologist  Anesthesiologist: Elmer Picker, MD Performed by: Elmer Picker, MD Authorized by: Elmer Picker, MD   Preanesthetic Checklist Completed: patient identified, IV checked, risks and benefits discussed, surgical consent, monitors and equipment checked, pre-op evaluation and timeout performed Spinal Block Patient position: sitting Prep: DuraPrep and site prepped and draped Patient monitoring: cardiac monitor, continuous pulse ox and blood pressure Approach: midline Location: L3-4 Injection technique: single-shot Needle Needle type: Pencan  Needle gauge: 24 G Needle length: 9 cm Assessment Sensory level: T6 Events: CSF return Additional Notes Functioning IV was confirmed and monitors were applied. Sterile prep and drape, including hand hygiene and sterile gloves were used. The patient was positioned and the spine was prepped. The skin was anesthetized with lidocaine.  Free flow of clear CSF was obtained prior to injecting local anesthetic into the CSF.  The spinal needle aspirated freely following injection.  The needle was carefully withdrawn.  The patient tolerated the procedure well.

## 2021-10-22 NOTE — Anesthesia Postprocedure Evaluation (Signed)
Anesthesia Post Note  Patient: Crystal Castillo  Procedure(s) Performed: CESAREAN SECTION MULTI-GESTATIONAL WITH TUBAL     Patient location during evaluation: PACU Anesthesia Type: Spinal Level of consciousness: oriented and awake and alert Pain management: pain level controlled Vital Signs Assessment: post-procedure vital signs reviewed and stable Respiratory status: spontaneous breathing, respiratory function stable and patient connected to nasal cannula oxygen Cardiovascular status: blood pressure returned to baseline and stable Postop Assessment: no headache, no backache and no apparent nausea or vomiting Anesthetic complications: no   No notable events documented.  Last Vitals:  Vitals:   10/22/21 1320 10/22/21 1402  BP: (!) 112/59 116/62  Pulse: 71 (!) 49  Resp: 18   Temp: (!) 36 C   SpO2: 97% 97%    Last Pain:  Vitals:   10/22/21 1320  TempSrc: Axillary  PainSc:    Pain Goal:                Epidural/Spinal Function Cutaneous sensation: Tingles (10/22/21 1402), Patient able to flex knees: Yes (10/22/21 1402), Patient able to lift hips off bed: Yes (10/22/21 1402), Back pain beyond tenderness at insertion site: No (10/22/21 1402), Progressively worsening motor and/or sensory loss: No (10/22/21 1402), Bowel and/or bladder incontinence post epidural: No (10/22/21 1402)  Kjuan Seipp L Zyaire Mccleod

## 2021-10-22 NOTE — Progress Notes (Signed)
ADA Standards of Care 2023 Diabetes in Pregnancy Target Glucose Ranges:  Fasting: 70 - 95 mg/dL 1 hr postprandial:  957 - 140mg /dL (from first bite of meal) 2 hr postprandial:  100 - 120 mg/dL (from first bit of meal)    Review of Glycemic Control  Latest Reference Range & Units 10/22/21 07:42  Glucose-Capillary 70 - 99 mg/dL 79   Diabetes history:  DM 2 Outpatient Diabetes medications:  Omnipod insulin pump with Dexcom Basal insulin- 2 units/ hr Bolus- 1 unit/5 grams Current orders for Inpatient glycemic control:  Insulin pump  Inpatient Diabetes Program Recommendations:    Spoke to RN regarding patient. Blood sugar this AM was 79 mg/dL.  For surgery recommend stopping insulin pump and check blood sugars q 1 hour.  Goal is to keep blood sugar less than 120 mg/dL. After delivery, recommend Semglee 18 units daily (start ASAP) and add Novolog sensitive q 4 hours.   Will follow.   Thanks,  12/22/21, RN, BC-ADM Inpatient Diabetes Coordinator Pager [phone]  (8a-5p)

## 2021-10-22 NOTE — H&P (Addendum)
OBSTETRIC ADMISSION HISTORY AND PHYSICAL  Crystal Castillo is a 31 y.o. female G2P1001 with IUP at [redacted]w[redacted]d by LMP presenting for pLTCS 2/2 breech-breech presentation. She reports +FMs, No LOF, no VB, no blurry vision, headaches or peripheral edema, and RUQ pain.  She plans on breast feeding. She request BTL for birth control; consent signed 8/9. 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 Pt states she was told she had DM, but lost @  150 lbs in 9 months.   Diabetes mellitus without complication ([name])    Hepatitis C    Obesity    PTSD (post-traumatic stress disorder)    Vaccine counseling 05/22/2021    Past Surgical History: Past Surgical History:  Procedure Laterality Date   TONSILLECTOMY     WISDOM TOOTH EXTRACTION      Obstetrical History: OB History     Gravida  2   Para  1   Term  1   Preterm  0   AB  0   Living  1      SAB  0   IAB  0   Ectopic  0   Multiple  0   Live Births  1           Social History Social History   Socioeconomic History   Marital status: Married    Spouse name: Not on file   Number of children: Not on file   Years of education: Not on file   Highest education level: Not on file  Occupational History   Not on file  Tobacco Use   Smoking status: Former    Packs/day: 0.50    Types: Cigarettes    Quit date:  04/11/2018    Years since quitting: 3.5   Smokeless tobacco: Never   Tobacco comments:    rarely smokes  Vaping Use   Vaping Use: Never used  Substance and Sexual Activity   Alcohol use: Not Currently    Comment: occasional drinker - 2 or 3 times a week   Drug use: Not Currently    Comment: clean for 5 years   Sexual activity: Yes    Birth control/protection: None  Other Topics Concern   Not on file  Social History Narrative   ** Merged History Encounter **       Social Determinants of Health   Financial Resource Strain: Not on file  Food Insecurity: No Food Insecurity (10/22/2021)   Hunger Vital Sign    Worried About Running Out of Food in the Last Year: Never true    Ran Out of Food in the Last Year: Never true  Transportation Needs: No Transportation Needs (10/22/2021)   PRAPARE - Hydrologist (Medical): No    Lack of Transportation (Non-Medical): No  Physical Activity: Not on file  Stress: Not  on file  Social Connections: Unknown (08/22/2018)   Social Connection and Isolation Panel [NHANES]    Frequency of Communication with Friends and Family: Not on file    Frequency of Social Gatherings with Friends and Family: Not on file    Attends Religious Services: Not on file    Active Member of Clubs or Organizations: Not on file    Attends Archivist Meetings: Not on file    Marital Status: Married    Family History: Family History  Problem Relation Age of Onset   Diabetes Mother    Asthma Mother    Alcohol abuse Father    Hypertension Father    COPD Father    Cancer Paternal Uncle    Cancer Maternal Grandfather    Cancer Paternal Grandfather    Birth defects Neg Hx    Heart disease Neg Hx    Stroke Neg Hx     Allergies: Allergies  Allergen Reactions   Quetiapine Itching   Sulfa Antibiotics Nausea Only   Sulfa Antibiotics Nausea Only    Medications Prior to Admission  Medication Sig Dispense Refill Last Dose    acetaminophen (TYLENOL) 325 MG tablet Take 650 mg by mouth every 6 (six) hours as needed for moderate pain or mild pain.      aspirin EC 81 MG tablet Take 1 tablet (81 mg total) by mouth daily. (Patient taking differently: Take 162 mg by mouth daily.) 90 tablet 6 10/21/2021   calcium carbonate (OS-CAL) 1250 (500 Ca) MG chewable tablet Chew 1 tablet by mouth daily as needed for heartburn. tums   Past Week   dextrose (GLUTOSE) 40 % GEL Take 31 g by mouth once as needed for up to 1 dose for low blood sugar. 37.5 g 0    insulin aspart (NOVOLOG FLEXPEN) 100 UNIT/ML FlexPen Inject 30 Units into the skin daily as needed for high blood sugar.   10/21/2021   insulin aspart (NOVOLOG) 100 UNIT/ML injection For use in Omnipod. Current daily dosage is 100 units per day. TTD 20 mL 8 10/21/2021   Insulin Disposable Pump (OMNIPOD DASH PODS, GEN 4,) MISC Inject into the skin.      Magnesium 400 MG CAPS Take 1 capsule by mouth daily. 90 capsule 2 10/21/2021   pantoprazole (PROTONIX) 40 MG tablet Take 1 tablet (40 mg total) by mouth daily. 30 tablet 1 10/21/2021   polyethylene glycol powder (GLYCOLAX/MIRALAX) 17 GM/SCOOP powder Take 17 g by mouth daily as needed. (Patient taking differently: Take 17 g by mouth daily as needed for mild constipation or moderate constipation.) 510 g 11    Prenatal Vit-Fe Fumarate-FA (PREPLUS) 27-1 MG TABS Take 1 tablet by mouth daily. (Patient taking differently: Take 1 tablet by mouth daily. Low iron) 30 tablet 11 10/21/2021   Accu-Chek Softclix Lancets lancets To check blood sugars 4 times a day. Fasting and 2 hours after the first bite of breakfast, lunch and dinner. 100 each 12    Blood Pressure Monitoring DEVI 1 each by Does not apply route once a week. 1 each 0    Continuous Blood Gluc Sensor (DEXCOM G6 SENSOR) MISC by Does not apply route.      Continuous Blood Gluc Transmit (DEXCOM G6 TRANSMITTER) MISC 1 Device by Does not apply route every 3 (three) months. 1 each 2    glucose blood  (ACCU-CHEK GUIDE) test strip To check blood sugars 4 times a day. Fasting and 2 hours after the first bite of breakfast, lunch  and dinner. 100 each 12    Insulin Disposable Pump (OMNIPOD DASH INTRO, GEN 4,) KIT 1 kit by Does not apply route every 3 (three) days. (Patient not taking: Reported on 10/16/2021) 1 kit 0 Not Taking   Insulin Pen Needle 31G X 5 MM MISC 1 Device by Does not apply route in the morning, at noon, in the evening, and at bedtime. 100 each 5      Review of Systems   All systems reviewed and negative except as stated in HPI  Blood pressure 116/71, pulse 91, resp. rate 18, height 5' 7"  (1.702 m), weight (!) 137.8 kg, last menstrual period 02/03/2021, SpO2 98 %, unknown if currently breastfeeding. General appearance: alert, cooperative, appears stated age, and no distress Lungs: clear to auscultation bilaterally Heart: regular rate and rhythm Abdomen: soft, non-tender; bowel sounds normal Pelvic: deferred Extremities: Homans sign is negative, no sign of DVT Presentation: breech, breech via leopolds Fetal monitoring  A 167 B 140 Uterine activity n/a     Prenatal labs: ABO, Rh: --/--/A POS (09/05 5809) Antibody: NEG (09/05 0856) Rubella: 2.37 (02/23 1558) RPR: NON REACTIVE (09/05 0930)  HBsAg: Negative (02/23 1558)  HIV: Non Reactive (07/14 1110)  GBS: Negative/-- (08/30 1549)  1 hr Glucola T2DM Genetic screening  LR, girl/girl;  MSAFP screening showed increased risk for open neural  tube defects  Anatomy US Fetal spines unable to be evaluated 2/2 to fetal lie but otherwise normal  Prenatal Transfer Tool  Maternal Diabetes: Yes:  Diabetes Type:  Pre-pregnancy, Insulin/Medication controlled Genetic Screening: Abnormal:  Results: Elevated AFP Maternal Ultrasounds/Referrals: Other: Fetal Ultrasounds or other Referrals:  Fetal echo, Referred to Materal Fetal Medicine  Maternal Substance Abuse:  No; prior hx, now hx of HCV; prior heroin use not this  pregnancy Significant Maternal Medications:  Meds include: Protonix Significant Maternal Lab Results:  Group B Strep negative Number of Prenatal Visits:greater than 3 verified prenatal visits Other Comments:   Hx of MDD, AND BPD  Results for orders placed or performed during the hospital encounter of 10/22/21 (from the past 24 hour(s))  Glucose, capillary   Collection Time: 10/22/21  7:42 AM  Result Value Ref Range   Glucose-Capillary 79 70 - 99 mg/dL  Results for orders placed or performed during the hospital encounter of 10/21/21 (from the past 24 hour(s))  RPR   Collection Time: 10/21/21  9:30 AM  Result Value Ref Range   RPR Ser Ql NON REACTIVE NON REACTIVE  CBC   Collection Time: 10/21/21  9:30 AM  Result Value Ref Range   WBC 10.2 4.0 - 10.5 K/uL   RBC 3.87 3.87 - 5.11 MIL/uL   Hemoglobin 12.3 12.0 - 15.0 g/dL   HCT 36.3 36.0 - 46.0 %   MCV 93.8 80.0 - 100.0 fL   MCH 31.8 26.0 - 34.0 pg   MCHC 33.9 30.0 - 36.0 g/dL   RDW 13.6 11.5 - 15.5 %   Platelets 193 150 - 400 K/uL   nRBC 0.0 0.0 - 0.2 %    Patient Active Problem List   Diagnosis Date Noted   BMI 45.0-49.9, adult ([place]) 09/24/2021   Malpresentation before onset of labor 09/24/2021   Cirrhosis ([place]) 05/22/2021   Vaccine counseling 05/22/2021   Dichorionic diamniotic twin pregnancy 05/01/2021   Abnormal genetic test during pregnancy 05/01/2021   Hepatitis C infection 04/14/2021   Supervision of high risk pregnancy, antepartum 04/10/2021   Type 2 diabetes mellitus without complication ([place]) 98/33/8250   Maternal  morbid obesity, antepartum ([place]) 01/20/2018   Pre-existing insulin treated diabetes mellitus during pregnancy ([place]) 01/20/2018   Borderline personality disorder ([place]) 08/19/2016   Major depressive disorder, 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 G2P1001 at 75w2dhere for pLTCS 2/2 malpresentation of didi  twins.   T2DM Omnipod, has decreased insuling to 1.35/h from 2/h due to night time hypoglycemia. CBG most recently 79 Omnipod may run out this evening. Previously on metformin and long acting prior to pregnancy.  -plan to transition back to pre-pregnancy lantus 30 qhs and metformin postpartum  Hep C Diagnosed this pregnancy, plan to treat after delivery  The risks of surgery were discussed with the patient including but were not limited to: bleeding which may require transfusion or reoperation; infection which may require antibiotics; injury to bowel, bladder, ureters or other surrounding organs; injury to the fetus; need for additional procedures including hysterectomy in the event of a life-threatening hemorrhage; formation of adhesions; placental abnormalities wth subsequent pregnancies; incisional problems; thromboembolic phenomenon and other postoperative/anesthesia complications.   Patient desires permanent sterilization. Risks of procedure discussed with patient including but not limited to: risk of regret, permanence of method, bleeding, infection, injury to surrounding organs and need for additional procedures.  Failure risk of about 1% with increased risk of ectopic gestation if pregnancy occurs was also discussed with patient. Patient verbalized understanding of these risks and wants to proceed with sterilization.  Desires salpingectomy. Written informed consent obtained.  To OR when ready.   The patient concurred with the proposed plan, giving informed written consent for the procedures.  Antibiotics ordered.  To OR when ready.    #ID:  GBS neg #MOF: Breast #MOC: salpingectomy signed 8/9 #Circ:  N/a  NDesma Maxim MD  10/22/2021, 9:26 AM

## 2021-10-22 NOTE — Discharge Summary (Signed)
Postpartum Discharge Summary  Date of Service updated - yes     Patient Name: Crystal Castillo DOB: 18-Jul-1990 MRN: [id number]  Date of admission: 10/22/2021 Delivery date:   Crystal, Castillo [[id number]]  10/22/2021    Crystal, Haggar Castillo [[id number]]  10/22/2021 Delivering provider:    Geraldo Docker Castillo [[id number]]  Crystal Castillo, Crystal, Castillo [[id number]]  [place], Crystal Castillo Date of discharge: 10/25/2021  Admitting diagnosis: Twin gestation in third trimester [O30.003] Status post primary low transverse cesarean section [Z98.891] Intrauterine pregnancy: [redacted]w[redacted]d    Secondary diagnosis:  Principal Problem:   Twin gestation in third trimester Active Problems:   Pre-existing insulin treated diabetes mellitus during [place]   Type 2 diabetes mellitus without complication ([facility]   Supervision of high risk pregnancy, antepartum   Status post primary low transverse cesarean section  Additional problems: none    Discharge diagnosis: Term Pregnancy Delivered, Type 2 DM, and s/p BTL                                                Post partum procedures:postpartum tubal ligation Augmentation: N/A Complications: None  Hospital course: Sceduled C/S   31y.o. yo G2P2003 at 329w2das admitted to the hospital 10/22/2021 for scheduled cesarean section with the following indication:Malpresentation of Twin A. Post partum period was uncomplicated. She is ambulating, has passed gas, voiding well and feels ready for discharge home. Blood sugars have been well controlled postpartum on 10 units lantus nightly and metformin 1g BID.  Delivery details are as follows:  Membrane Rupture Time/Date:    [name], Castillo [[id number]]10:22 AM    MoJudithann Castillo [0[[id number]]10:19 AM ,   [name], [name]0[[id number]]10/22/2021    [name], [name]0[[id number]]10/22/2021   Delivery Method:   [name] [0[[id number]]C-Section, Low Transverse    [name], [name] [0[[id number]] C-Section, Low Transverse Details of operation can be found in separate operative note.  Patient had an uncomplicated postpartum course.  She is ambulating, tolerating a regular diet, passing flatus, and urinating well. Patient is discharged home in stable condition on  10/25/21        Newborn Data: Birth date:   [name], [name]0[[id number]]10/22/2021    [name], [name] [[id number]]10/22/2021 Birth time:   [name], Castillo [[id number]]10:22 AM    MoJudithann Castillo [[id number]]10:20 AM Gender:   [name], Castillo [[id number]]Female    [name], [name]0[[id number]]Female Living status:   [name], [name]0[[id number]]Living    [name], Castillo [[id number]]Living Apgars:   [name], Castillo [0[[id number]]9 [place]0[[id number]]  6 ,   [name], [name][[id number]]9 [address]ria [0[[id number]]9 Weight:   [name], Castillo [0[[id number]]2450 g    [name], Castillo [0[[id number]]2560 g     Magnesium Sulfate received: No BMZ received: No Rhophylac:No MMR:No T-DaP:Given prenatally Flu: N/A Transfusion:No  Physical exam  Vitals:   10/24/21 1500 10/24/21 2155 10/25/21 0615 10/25/21 1315  BP: 127/70 (!) 133/58 116/66 129/85  Pulse: 80 83 75 80  Resp: _0 18  Temp: 97.7 F (36.5 C) 98.2 F (36.8 C) 98.2 F (36.8 C) 97.6 F (36.4 C)  TempSrc: Oral Oral Oral Oral  SpO2:  99% 98% 100%  Weight:      Height:       General: alert, cooperative, and no distress Lochia: appropriate Uterine Fundus: firm Incision: neg pressure dressing is clean and dry DVT Evaluation: No evidence of DVT seen on physical exam. Labs: Lab Results  Component Value Date   WBC 9.7 10/23/2021   HGB 11.3 (L) 10/23/2021   HCT 33.8 (L) 10/23/2021   MCV 93.6 10/23/2021   PLT 161 10/23/2021      Latest Ref Rng & Units 10/25/2021    4:33 AM  CMP  Glucose 70 - 99 mg/dL 94    Crystal Castillo Score:    10/24/2021    1:13 PM  Crystal Castillo Postnatal  Depression Scale Screening Tool  I have been able to laugh and see the funny side of things. 0  I have looked forward with enjoyment to things. 0  I have blamed myself unnecessarily when things went wrong. 1  I have been anxious or worried for no good reason. 2  I have felt scared or panicky for no good reason. 1  Things have been getting on top of me. 0  I have been so unhappy that I have had difficulty sleeping. 0  I have felt sad or miserable. 0  I have been so unhappy that I have been crying. 0  The thought of harming myself has occurred to me. 0  Crystal Castillo Postnatal Depression Scale Total 4     After visit meds:  Allergies as of 10/25/2021       Reactions   Quetiapine Itching   Sulfa Antibiotics Nausea Only   Sulfa Antibiotics Nausea Only        Medication List     STOP taking these medications    acetaminophen 325 MG tablet Commonly known as: TYLENOL   aspirin EC 81 MG tablet   calcium carbonate 1250 (500 Ca) MG chewable tablet Commonly known as: OS-CAL   dextrose 40 % Gel Commonly known as: GLUTOSE   insulin aspart 100 UNIT/ML injection Commonly known as: novoLOG   Magnesium 400 MG Caps   NovoLOG FlexPen 100 UNIT/ML FlexPen Generic drug: insulin aspart   Omnipod DASH Intro (Gen 4) Kit   Omnipod DASH Pods (Gen 4) Misc   pantoprazole 40 MG tablet Commonly known as: Protonix       TAKE these medications    Accu-Chek Guide test strip Generic drug: glucose blood To check blood sugars 4 times a day. Fasting and 2 hours after the first bite of breakfast, lunch and dinner.   Accu-Chek Softclix Lancets lancets To check blood sugars 4 times a day. Fasting and 2 hours after the first bite of breakfast, lunch and dinner.   Blood Pressure Monitoring Devi 1 each by Does not apply route once a week.   Dexcom G6 Sensor Misc by Does not apply route.   Dexcom G6 Transmitter Misc 1 Device by Does not apply route every 3 (three) months.   ibuprofen 600  MG tablet Commonly known as: ADVIL Take 1 tablet (600 mg total) by mouth every 6 (six) hours.   Insulin Pen Needle 31G X 5 MM Misc 1 Device by Does not apply route in the morning, at noon, in the evening, and at bedtime.   metFORMIN 500 MG tablet Commonly known as: Glucophage Take 2 tablets (1,000 mg  total) by mouth 2 (two) times daily with a meal.   oxyCODONE 5 MG immediate release tablet Commonly known as: Oxy IR/ROXICODONE Take 1 tablet (5 mg total) by mouth every 6 (six) hours as needed for breakthrough pain.   polyethylene glycol powder 17 GM/SCOOP powder Commonly known as: GLYCOLAX/MIRALAX Take 17 g by mouth daily. What changed:  when to take this reasons to take this   PrePLUS 27-1 MG Tabs Take 1 tablet by mouth daily. What changed: additional instructions         Discharge home in stable condition Infant Feeding: Breast Infant Disposition:home with mother Discharge instruction: per After Visit Summary and Postpartum booklet. Activity: Advance as tolerated. Pelvic rest for 6 weeks.  Diet: carb modified diet Future Appointments: Future Appointments  Date Time Provider [place]  10/29/2021  9:20 AM [facility] NURSE [facility]  11/21/2021 11:00 AM [facility] NURSE [facility]  11/25/2021  9:15 AM Clarnce Flock, MD [facility]   Follow up Visit:  Message sent to [facility] by [place] on 10/25/2021 Please schedule this patient for a In person postpartum visit in 4 weeks with the following provider: MD and APP. Additional Postpartum F/U:Incision check 1 week  High risk pregnancy complicated by:  Twin gestation, T2DM Delivery mode:     Ernisha, Sorn [[id number]]  C-Section, Low Transverse    Rachell, Druckenmiller [[id number]]  C-Section, Low Transverse Anticipated Birth Control:  BTL done PP   Liliane Channel MD MPH OB Fellow, [place] for [place] 10/25/2021

## 2021-10-23 LAB — GLUCOSE, CAPILLARY
Glucose-Capillary: 75 mg/dL (ref 70–99)
Glucose-Capillary: 84 mg/dL (ref 70–99)
Glucose-Capillary: 79 mg/dL (ref 70–99)
Glucose-Capillary: 91 mg/dL (ref 70–99)

## 2021-10-23 LAB — CBC
HCT: 33.8 % — ABNORMAL LOW (ref 36.0–46.0)
Hemoglobin: 11.3 g/dL — ABNORMAL LOW (ref 12.0–15.0)
MCH: 31.3 pg (ref 26.0–34.0)
MCHC: 33.4 g/dL (ref 30.0–36.0)
MCV: 93.6 fL (ref 80.0–100.0)
Platelets: 161 10*3/uL (ref 150–400)
RBC: 3.61 MIL/uL — ABNORMAL LOW (ref 3.87–5.11)
RDW: 13.4 % (ref 11.5–15.5)
WBC: 9.7 10*3/uL (ref 4.0–10.5)
nRBC: 0 % (ref 0.0–0.2)

## 2021-10-23 LAB — GLUCOSE, RANDOM: Glucose, Bld: 77 mg/dL (ref 70–99)

## 2021-10-23 NOTE — Progress Notes (Addendum)
POSTPARTUM PROGRESS NOTE  POD #1  Subjective:  Crystal Castillo is a 31 y.o. G2P2003 s/p LTCS and BTL at [redacted]w[redacted]d.  She reports she doing well. No acute events overnight. She denies any problems with ambulating, voiding or po intake. Denies nausea or vomiting. She has passed flatus. Pain is well controlled.  Lochia is appropriate.  Objective: Blood pressure 106/69, pulse 72, temperature 98 F (36.7 C), temperature source Oral, resp. rate 18, height 5\' 7"  (1.702 m), weight (!) 137.8 kg, last menstrual period 02/03/2021, SpO2 100 %, unknown if currently breastfeeding.  Physical Exam:  General: alert, cooperative and no distress Chest: no respiratory distress Heart:regular rate, distal pulses intact Abdomen: soft, nontender,  Uterine Fundus: firm, appropriately tender DVT Evaluation: No calf swelling or tenderness Extremities: No edema Skin: warm, dry; incision clean/dry/intact w/ prevena dressing in place  Recent Labs    10/21/21 0930 10/23/21 0459  HGB 12.3 11.3*  HCT 36.3 33.8*    Assessment/Plan: Crystal Castillo is a 31 y.o. G2P2003 s/p pLTCS and BS at [redacted]w[redacted]d for twins.  POD#1 - Doing welll; pain well controlled. H/H appropriate  Routine postpartum care  OOB, ambulated  Lovenox for VTE prophylaxis Mild Anemia: asymptomatic T2DM: Patient has dexcom, average BGL's in 110's with high of 170. Diabetic educator is on board, we appreciate their assistance. Continue glargine 10u qhs and metformin 1000mg  BID. Has not needed sliding scale insulin.  Contraception: BS Feeding: Breast and Bottle  Dispo: Plan for discharge 10/24/21.   LOS: 1 day   , MD Resident Physician 10/23/2021, 6:44 AM   Fellow Attestation  I saw and evaluated the patient, performing the key elements of the service.I  personally performed or re-performed the history, physical exam, and medical decision making activities of this service and have verified that the service and findings are accurately  documented in the resident's note. I developed the management plan that is described in the resident's note, and I agree with the content, with my edits above.    Moody Bruins, MD OB Fellow

## 2021-10-23 NOTE — Lactation Note (Signed)
This note was copied from a baby's chart. Lactation Consultation Note  Patient Name: Crystal Castillo [id number] Date: 10/23/2021 Reason for consult: Follow-up assessment;Multiple gestation;Early term 37-38.6wks;Infant < 6lbs;Nipple pain/trauma;Maternal endocrine disorder Age:29 hours  LC in to visit with P3 Mom of ET twins.  Both babies are latching and feeding at the breast, Baby A is doing better (2% weight loss) and feeding longer than Baby B (4% weight loss).   Both babies are being supplemented with donor breast milk, volumes of 10 ml by paced bottle.  Reviewed this with Mom. Mom has pumped a couple times as she stated she didn't get much.  Reviewed the importance of consistent pumping after breastfeeding to support a full milk supply, while babies are being supplemented by Support Person.  Positional stripe noted on right nipple as Mom states it is larger in diameter.  Medela Gel Pads provided for Mom to use along with colostrum tabbed onto nipple.  LATCH Score Latch: Grasps breast easily, tongue down, lips flanged, rhythmical sucking.  Audible Swallowing: Spontaneous and intermittent  Type of Nipple: Everted at rest and after stimulation  Comfort (Breast/Nipple): Filling, red/small blisters or bruises, mild/mod discomfort  Hold (Positioning): No assistance needed to correctly position infant at breast.  LATCH Score: 9   Lactation Tools Discussed/Used Tools: Pump;Flanges;Bottle;Comfort gels Flange Size: 24 Breast pump type: Double-Electric Breast Pump Pump Education: Setup, frequency, and cleaning;Milk Storage Reason for Pumping: support milk supply/ET twins/<6lbs Pumping frequency: Encouraged to pump both breasts after breastfeeding Pumped volume: 0 mL (drops)  Interventions Interventions: Breast feeding basics reviewed;Adjust position;Support pillows;Breast compression;DEBP;Comfort gels;Pace feeding  Consult Status Consult Status: Follow-up Date: 10/24/21 Follow-up  type: In-patient    Judee Clara 10/23/2021, 3:21 PM

## 2021-10-23 NOTE — Progress Notes (Signed)
Inpatient Diabetes Program Recommendations  AACE/ADA: New Consensus Statement on Inpatient Glycemic Control (2015)  Target Ranges:  Prepandial:   less than 140 mg/dL      Peak postprandial:   less than 180 mg/dL (1-2 hours)      Critically ill patients:  140 - 180 mg/dL   Lab Results  Component Value Date   GLUCAP 125 (H) 10/22/2021   HGBA1C 6.0 (H) 04/10/2021    Review of Glycemic Control  Latest Reference Range & Units 10/22/21 12:09 10/22/21 17:41 10/22/21 20:48 10/23/21 04:59  Glucose-Capillary 70 - 99 mg/dL 93 85 790 (H)   Glucose 70 - 99 mg/dL    77  Diabetes history:  DM 2 Outpatient Diabetes medications:  Omnipod insulin pump with Dexcom Basal insulin- 2 units/ hr Bolus- 1 unit/5 grams Current orders for Inpatient glycemic control:  Semglee 10 units q HS Metformin XR 1000 mg bid Inpatient Diabetes Program Recommendations:     Blood sugars look great!! Will follow.   Thanks,  Beryl Meager, RN, BC-ADM Inpatient Diabetes Coordinator Pager [phone]  (8a-5p)

## 2021-10-23 NOTE — Social Work (Signed)
CSW received consult for hx of Anxiety and Depression PTSD and Borderline Personality Disorder.  CSW met with MOB to offer support and complete assessment. CSW entered the room, introduced self, CSW role and reason for visit. MOB was agreeable to visit. CSW observed both infants in their bassinets, FOB and MOB's sister sitting on the couch and MOB up in the bed. CSW offered privacy, MOB granted CSW permission for CSW to speak in front of family. CSW inquired about how MOB was feeling, MOB reported she was doing pretty good. MOB reported her anxiety was a little high coming into a scheduled Csection since she had never had surgery before but MOB reported everyone helped her remain calm and the nursing staff has been great. CSW congratulated MOB on the birth of her twins. CSW inquired about MOB's MH hx. MOB reported she she was diagnosed with anxiety and depression around age 31 or 60 and dealt with that throughout her adolescent years. MOB reported she was also prescribed an antidepressant at that time. MOB explained she was diagnosed with BPD in early adulthood. CSW inquired about treatment, MOB reports she has been on medications before and in therapy multiple times but none currently. MOB reports she has had a stable mood throughout pregnancy and no concerns at this time. CSW provided education regarding the baby blues period vs. perinatal mood disorders, discussed treatment and gave resources for mental health follow up if concerns arise.  CSW recommends self-evaluation during the postpartum time period using the New Mom Checklist from Postpartum Progress and encouraged MOB to contact a medical professional if symptoms are noted at any time.  CSW assessed for safety, MOB denied any SI or HI, notified CSW her last hospitalization for SI was in 2018. MOB identified her spouse, sister, close friends as her supports.   CSW provided review of Sudden Infant Death Syndrome (SIDS) precautions. MOB identified [place] for infants follow up care.  MOB reported they have all necessary item for the infants including bassinets for them to sleep.   CSW identifies no further need for intervention and no barriers to discharge at this time.   Letta Kocher, [place] Social Worker [phone]

## 2021-10-24 LAB — GLUCOSE, CAPILLARY
Glucose-Capillary: 75 mg/dL (ref 70–99)
Glucose-Capillary: 86 mg/dL (ref 70–99)
Glucose-Capillary: 96 mg/dL (ref 70–99)
Glucose-Capillary: 105 mg/dL — ABNORMAL HIGH (ref 70–99)

## 2021-10-24 LAB — GLUCOSE, RANDOM: Glucose, Bld: 77 mg/dL (ref 70–99)

## 2021-10-24 LAB — SURGICAL PATHOLOGY

## 2021-10-24 MED ORDER — TETANUS-DIPHTH-ACELL PERTUSSIS 5-2.5-18.5 LF-MCG/0.5 IM SUSY
0.5000 mL | PREFILLED_SYRINGE | Freq: Once | INTRAMUSCULAR | Status: AC
Start: 2021-10-24 — End: 2021-10-24
  Administered 2021-10-24: 0.5 mL via INTRAMUSCULAR
  Filled 2021-10-24: qty 0.5

## 2021-10-24 NOTE — Progress Notes (Signed)
Subjective: Postpartum Day 2: Cesarean Delivery Patient has no complaints this morning. Ambulating and voiding without problems. Pain controlled    Objective: Vital signs in last 24 hours: Temp:  [98 F (36.7 C)-98.2 F (36.8 C)] 98 F (36.7 C) (09/08 0529) Pulse Rate:  [78-83] 83 (09/08 0529) Resp:  [18] 18 (09/08 0529) BP: (126-129)/(64-81) 127/81 (09/08 0529) SpO2:  [97 %] 97 % (09/08 0529)  Physical Exam:  General: alert Lochia: appropriate Uterine Fundus: firm Incision: healing well DVT Evaluation: No evidence of DVT seen on physical exam.  Recent Labs    10/23/21 0459  HGB 11.3*  HCT 33.8*    Assessment/Plan: Status post Cesarean section. Doing well postoperatively.  Continue current care.  Hermina Staggers, MD 10/24/2021, 10:30 AM

## 2021-10-24 NOTE — Lactation Note (Signed)
This note was copied from a baby's chart. Lactation Consultation Note  Patient Name: Crystal Castillo [id number] Date: 10/24/2021 Reason for consult: Follow-up assessment;Early term 37-38.6wks;Multiple gestation;Infant < 6lbs;Infant weight loss;Difficult latch;Nipple pain/trauma;Maternal endocrine disorder Type 2 DM Age:29 hours  LC in to visit with P3 Mom of ET twins born weighing <6 lbs at birth. Twin A is at a 6% weight loss Twin B is at a 9% weight loss Both babies have been supplemented after breastfeeding, but volumes have been low.  Mom in tears this am.  Support provided.  Plan today is to focus on pumping and supplementing.  Mom's nipple pain is significant, positional stripe on right nipple.  Babies need to grow into being able to latch deeper to the breast and transfer adequate volume.  Reassured Mom that this was very normal due to their small size and how important frequent consistent double pumping is to support her milk supply.  Mom stated she had not pumped since last evening.    LC provided a hand's free pumping top and assisted with first pumping.  Mom expressed about 3 ml which she will alternate which baby is fed the EBM.    Both babies fed 30 ml at last feeding.  Mom aware of importance of increasing volume of feedings to 20-30 ml.  MD to fortify donor milk to 22 cal.    Reassured Mom and praised her for her dedication to her babies.  Mom to ask for help prn.  Lactation Tools Discussed/Used Tools: Pump;Flanges;Bottle;Hands-free pumping top;Comfort gels Flange Size: 24 Breast pump type: Double-Electric Breast Pump Pump Education: Setup, frequency, and cleaning;Milk Storage Reason for Pumping: Support milk supply Pumping frequency: Mom will increase pumping frequency today to every 2-3 hrs Pumped volume: 3 mL  Interventions Interventions: Skin to skin;Breast massage;Hand express;Support pillows;DEBP;Comfort gels;Pace feeding  Discharge Pump: Personal;DEBP (Spectra  DEBP)  Consult Status Consult Status: Follow-up Date: 10/25/21 Follow-up type: In-patient    Crystal Castillo 10/24/2021, 10:11 AM

## 2021-10-25 LAB — GLUCOSE, CAPILLARY
Glucose-Capillary: 76 mg/dL (ref 70–99)
Glucose-Capillary: 104 mg/dL — ABNORMAL HIGH (ref 70–99)

## 2021-10-25 LAB — GLUCOSE, RANDOM: Glucose, Bld: 94 mg/dL (ref 70–99)

## 2021-10-25 MED ORDER — OXYCODONE HCL 5 MG PO TABS: 5.0000 mg | ORAL_TABLET | Freq: Four times a day (QID) | ORAL | 0 refills | Status: DC | PRN

## 2021-10-25 MED ORDER — POLYETHYLENE GLYCOL 3350 17 GM/SCOOP PO POWD
17.0000 g | Freq: Every day | ORAL | 0 refills | Status: DC
Start: 2021-10-25 — End: 2021-11-05

## 2021-10-25 MED ORDER — METFORMIN HCL 500 MG PO TABS: 1000.0000 mg | ORAL_TABLET | Freq: Two times a day (BID) | ORAL | 1 refills | Status: AC

## 2021-10-25 MED ORDER — IBUPROFEN 600 MG PO TABS: 600.0000 mg | ORAL_TABLET | Freq: Four times a day (QID) | ORAL | 0 refills | Status: DC

## 2021-10-25 NOTE — Lactation Note (Signed)
This note was copied from a baby's chart. Lactation Consultation Note  Patient Name: Crystal Castillo [id number] Date: 10/25/2021 Reason for consult: Follow-up assessment;Early term 37-38.6wks;Infant < 6lbs Age:31 years  Parent feels better about plan of pumping only at this time but does want to latch them when they are able to sustain deeper latch and transfer milk better.  LC praised mom's pumping efforts.  Approx. 20-25 ml pumped from each breast with previous pump session.   Pt. Is familiar with [facility].  LC strongly recommended OP appt. With sharon Hice, RN, IBCLC for follow up.  Mom is ecnouraged by this and desires appt.  Epic message sent.    Encouraged parent to pump every 3 hours.   Parent will feed neosure 22/cal for next feeding, to see if babies tolerate well.   Parent encouraged to call out for [facility] if she has questions regarding supplementation, expressed milk storage, or pumping.    Maternal Data    Feeding Mother's Current Feeding Choice: Breast Milk and Formula (also fortified donor milk) Nipple Type: Extra Slow Flow  LATCH Score                    Lactation Tools Discussed/Used    Interventions Interventions: Education;LC Services brochure  Discharge Discharge Education: Outpatient recommendation;Outpatient Epic message sent Pump: DEBP;Personal (spectra and motif "twist")  Consult Status Consult Status: Follow-up Date: 10/26/21 Follow-up type: In-patient    [name] [facility] 10/25/2021, 11:53 AM

## 2021-10-25 NOTE — Discharge Instructions (Addendum)
Leave dressing and vacuum device in place until your incision check at the 1 week mark  2.  Follow-up outpatient in 1 week for incision check, and then in 4 to 6 weeks, as scheduled for your postpartum visit.  You would have a repeat blood sugar test done at that time.  3.  Take Tylenol 1000 mg and ibuprofen 600 mg scheduled three times a day for the next few days, to help with your pain. Please take with food. You can also take oxycodone as needed for breakthrough pain.  4.  Please call if you start to experience increased vaginal bleeding requiring a change of pads every 2 hours, sudden onset severe headache, right abdominal pain, flashes of light in a vision or any concerns for new elevated blood pressure.   5. Continue metformin 1000mg  twice a daily and continue insulin 10 units nightly.

## 2021-10-29 ENCOUNTER — Other Ambulatory Visit: Payer: Self-pay

## 2021-10-29 ENCOUNTER — Ambulatory Visit (INDEPENDENT_AMBULATORY_CARE_PROVIDER_SITE_OTHER): Payer: Medicaid Other | Admitting: Family Medicine

## 2021-10-29 ENCOUNTER — Encounter: Payer: Self-pay | Admitting: Family Medicine

## 2021-10-29 VITALS — BP 136/86 | HR 64 | Wt 288.6 lb

## 2021-10-29 DIAGNOSIS — Z794 Long term (current) use of insulin: Secondary | ICD-10-CM | POA: Diagnosis not present

## 2021-10-29 DIAGNOSIS — L0291 Cutaneous abscess, unspecified: Secondary | ICD-10-CM

## 2021-10-29 DIAGNOSIS — E119 Type 2 diabetes mellitus without complications: Secondary | ICD-10-CM

## 2021-10-29 DIAGNOSIS — R238 Other skin changes: Secondary | ICD-10-CM

## 2021-10-29 DIAGNOSIS — Z5189 Encounter for other specified aftercare: Secondary | ICD-10-CM

## 2021-10-29 MED ORDER — BACITRACIN 500 UNIT/GM EX OINT: 1.0000 | TOPICAL_OINTMENT | Freq: Two times a day (BID) | CUTANEOUS | 0 refills | Status: DC

## 2021-10-29 MED ORDER — INSULIN GLARGINE 100 UNIT/ML SOLOSTAR PEN: 10.0000 [IU] | PEN_INJECTOR | Freq: Every day | SUBCUTANEOUS | 0 refills | Status: AC

## 2021-10-29 MED ORDER — CLINDAMYCIN HCL 150 MG PO CAPS: 450.0000 mg | ORAL_CAPSULE | Freq: Three times a day (TID) | ORAL | 0 refills | Status: DC

## 2021-10-29 NOTE — Assessment & Plan Note (Addendum)
Fluctuant mass palpated on exam though still relatively small (see photo). Discussed option of antibiotics with close follow up vs I&D. Patient reported she felt like she has been through this a few times before and they have never gotten better without I&D. Uncomplicated I&D performed (see procedure note below), will also do 5 day course of Clindamycin 450 mg TID.   Incision and Drainage Procedure Note Management options discussed with patient, they elected to proceed with incision and drainage. Reviewed risks of bleeding, worsening infection, pain, scarring, inability to resolve infection or recurrence of the infection. Verbal and written consent obtained.  Area cleaned with alcohol wipe. Superficial tissue injected with 3 cc's of 2% lidocaine. Adequate time allowed for anesthesia to become effective. A #11 blade was then used to open the abscess, and a 1.5 cm linear incision was created. A wound culture was obtained. Loculations were then disrupted using a hemostat. A small amount of purulent-bloody fluid was evacuated. Once all fluid had been expressed, wound was dressed with gauze. Patient tolerated procedure well.

## 2021-10-29 NOTE — Progress Notes (Signed)
GYNECOLOGY OFFICE VISIT NOTE  History:   Crystal Castillo is a 31 y.o. G2P2003 here today for wound vac removal and incision check.  Reports twins are doing well overall Having some pain and swelling below her scar Has had abscesses before and worried she is getting another one  Health Maintenance Due  Topic Date Due   FOOT EXAM  Never done   OPHTHALMOLOGY EXAM  Never done   COVID-19 Vaccine (3 - Pfizer series) 09/18/2019   INFLUENZA VACCINE  09/16/2021   HEMOGLOBIN A1C  10/08/2021    Past Medical History:  Diagnosis Date   Anorexia nervosa with bulimia    just finished treatment in August 2012 for this   Anxiety    Bartholin's cyst    Cirrhosis ([name]) 05/22/2021   Depression    Diabetes mellitus    not currently treated for this. Pt states she was told she had DM, but lost @  150 lbs in 9 months.   Diabetes mellitus without complication ([name])    Hepatitis C    Obesity    PTSD (post-traumatic stress disorder)    Vaccine counseling 05/22/2021    Past Surgical History:  Procedure Laterality Date   CESAREAN SECTION MULTI-GESTATIONAL WITH TUBAL N/A 10/22/2021   Procedure: CESAREAN SECTION MULTI-GESTATIONAL WITH TUBAL;  Surgeon: Kathrynn Running, MD;  Location: [facility]S;  Service: Obstetrics;  Laterality: N/A;   TONSILLECTOMY     WISDOM TOOTH EXTRACTION      The following portions of the patient's history were reviewed and updated as appropriate: allergies, current medications, past family history, past medical history, past social history, past surgical history and problem list.   Health Maintenance:   Last pap: Lab Results  Component Value Date   DIAGPAP  04/10/2021    - Negative for intraepithelial lesion or malignancy (NILM)    Last mammogram:  N/a    Review of Systems:  Pertinent items noted in HPI and remainder of comprehensive ROS otherwise negative.  Physical Exam:  BP 136/86   Pulse 64   Wt 288 lb 9.6 oz (130.9 kg)   LMP 02/03/2021   Breastfeeding  Yes   BMI 45.20 kg/m  CONSTITUTIONAL: Well-developed, well-nourished female in no acute distress.  HEENT:  Normocephalic, atraumatic. External right and left ear normal. No scleral icterus.  NECK: Normal range of motion, supple, no masses noted on observation SKIN: small 2-3 cm area of mild dehisence in L aspect of cesarean scar, additionally has a very small area of skin breakdown on superior edge. Minimal erythema and mild induration surrounding this area. Also has fluctuant, erythematous swelling in mons inferior to this area. See pictures.  MUSCULOSKELETAL: Normal range of motion. No edema noted. NEUROLOGIC: Alert and oriented to person, place, and time. Normal muscle tone coordination.  PSYCHIATRIC: Normal mood and affect. Normal behavior. Normal judgment and thought content. RESPIRATORY: Effort normal, no problems with respiration noted       Labs and Imaging Results for orders placed or performed during the hospital encounter of 10/22/21 (from the past 168 hour(s))  Glucose, capillary   Collection Time: 10/22/21  5:41 PM  Result Value Ref Range   Glucose-Capillary 85 70 - 99 mg/dL  Glucose, capillary   Collection Time: 10/22/21  8:48 PM  Result Value Ref Range   Glucose-Capillary 125 (H) 70 - 99 mg/dL  CBC   Collection Time: 10/23/21  4:59 AM  Result Value Ref Range   WBC 9.7 4.0 - 10.5 K/uL  RBC 3.61 (L) 3.87 - 5.11 MIL/uL   Hemoglobin 11.3 (L) 12.0 - 15.0 g/dL   HCT 16.1 (L) 09.6 - 04.5 %   MCV 93.6 80.0 - 100.0 fL   MCH 31.3 26.0 - 34.0 pg   MCHC 33.4 30.0 - 36.0 g/dL   RDW 40.9 81.1 - 91.4 %   Platelets 161 150 - 400 K/uL   nRBC 0.0 0.0 - 0.2 %  Glucose, random   Collection Time: 10/23/21  4:59 AM  Result Value Ref Range   Glucose, Bld 77 70 - 99 mg/dL  Glucose, capillary   Collection Time: 10/23/21  9:41 AM  Result Value Ref Range   Glucose-Capillary 75 70 - 99 mg/dL  Glucose, capillary   Collection Time: 10/23/21  2:17 PM  Result Value Ref Range    Glucose-Capillary 84 70 - 99 mg/dL  Glucose, capillary   Collection Time: 10/23/21  6:59 PM  Result Value Ref Range   Glucose-Capillary 79 70 - 99 mg/dL  Glucose, capillary   Collection Time: 10/23/21 10:10 PM  Result Value Ref Range   Glucose-Capillary 91 70 - 99 mg/dL  Glucose, random   Collection Time: 10/24/21  5:06 AM  Result Value Ref Range   Glucose, Bld 77 70 - 99 mg/dL  Glucose, capillary   Collection Time: 10/24/21  9:16 AM  Result Value Ref Range   Glucose-Capillary 75 70 - 99 mg/dL  Glucose, capillary   Collection Time: 10/24/21  1:18 PM  Result Value Ref Range   Glucose-Capillary 86 70 - 99 mg/dL  Glucose, capillary   Collection Time: 10/24/21  6:00 PM  Result Value Ref Range   Glucose-Capillary 96 70 - 99 mg/dL  Glucose, capillary   Collection Time: 10/24/21 10:37 PM  Result Value Ref Range   Glucose-Capillary 105 (H) 70 - 99 mg/dL  Glucose, random   Collection Time: 10/25/21  4:33 AM  Result Value Ref Range   Glucose, Bld 94 70 - 99 mg/dL  Glucose, capillary   Collection Time: 10/25/21 10:06 AM  Result Value Ref Range   Glucose-Capillary 76 70 - 99 mg/dL  Glucose, capillary   Collection Time: 10/25/21  1:20 PM  Result Value Ref Range   Glucose-Capillary 104 (H) 70 - 99 mg/dL   [place] MFM FETAL BPP WO NON STRESS  Result Date: 10/16/2021 ----------------------------------------------------------------------  OBSTETRICS REPORT                    (Corrected Final 10/16/2021 01:41 pm) ---------------------------------------------------------------------- Patient Info  ID #:       [id number]                          D.O.B.:  08-28-1990 (31 yrs)  Name:       Crystal Castillo [facility]                    Visit Date: 10/15/2021 11:35 am ---------------------------------------------------------------------- Performed By  Attending:        Braxton Feathers DO       Ref. Address:     [address]                                                             [place], [place]  [address]  Performed By:     Emeline Darling BS,      [facility]  Referred By:       Bing MD ---------------------------------------------------------------------- Orders  #  Description                           Code        Ordered By  1  [place] MFM FETAL BPP WO NON               76819.01    YU FANG     STRESS  2  [place] MFM FETAL BPP WO NST               76819.1     YU FANG     ADDL GESTATION ----------------------------------------------------------------------  #  Order #                     Accession #                Episode #  1  [id number]                   [id number]                 [id number]  2  [id number]                   [id number]                 [id number] ---------------------------------------------------------------------- Indications  Twin pregnancy, di/di, third trimester         O30.043  Pre-existing diabetes, type 2, in pregnancy,   O24.113  third trimester (insulin)  Obesity complicating pregnancy, third          O99.213  trimester (BMI 46)  Antenatal screening for elevated               Z36.1  alphafetoprotein level (AFP 3.27)  Abnormal chromosomal and genetic finding       O28.5  on antenatal screening of mother  Chronic Hepatitis C complicating pregnancy,    O98.419 B18.2  antepartum  Low Risk NIPS/Negative Horizon  [redacted] weeks gestation of pregnancy  Z3A.36  Fetal echo wnl x2 ---------------------------------------------------------------------- Vital Signs  BP:          122/69 ---------------------------------------------------------------------- Fetal Evaluation (Fetus A)  Num Of Fetuses:         2  Fetal Heart Rate(bpm):  138  Cardiac Activity:       Observed  Fetal Lie:              Maternal  lower left side  Presentation:           Breech  Membrane Desc:      Dividing Membrane seen - Dichorionic.  Amniotic Fluid  AFI FV:      Within normal limits                              Largest Pocket(cm)                              3.78 ---------------------------------------------------------------------- Biophysical Evaluation (Fetus A)  Amniotic F.V:   Pocket => 2 cm             F. Tone:        Observed  F. Movement:    Observed                   Score:          8/8  F. Breathing:   Observed ---------------------------------------------------------------------- OB History  Gravidity:    2         Term:   1        Prem:   0        SAB:   0  TOP:          0       Ectopic:  0        Living: 1 ---------------------------------------------------------------------- Gestational Age (Fetus A)  LMP:           36w 2d        Date:  02/03/21                 EDD:   11/10/21  Best:          Stevie Kern 2d     Det. By:  LMP  (02/03/21)          EDD:   11/10/21 ---------------------------------------------------------------------- Anatomy (Fetus A)  Stomach:               Appears normal, left   Bladder:                Appears normal                         sided ---------------------------------------------------------------------- Fetal Evaluation (Fetus B)  Num Of Fetuses:         2  Fetal Heart Rate(bpm):  132  Cardiac Activity:       Observed  Fetal Lie:              Maternal upper right side  Presentation:           Cephalic  Membrane Desc:      Dividing Membrane seen - Dichorionic.  Amniotic Fluid  AFI FV:      Within normal limits  Largest Pocket(cm)                              3.39 ---------------------------------------------------------------------- Biophysical Evaluation (Fetus B)  Amniotic F.V:   Pocket => 2 cm             F. Tone:        Observed  F. Movement:    Observed                   Score:          8/8  F. Breathing:   Observed  ---------------------------------------------------------------------- Biometry (Fetus B)  BPD:     80.82  mm     G. Age:  32w 3d        < 1  % ---------------------------------------------------------------------- Gestational Age (Fetus B)  LMP:           36w 2d        Date:  02/03/21                 EDD:   11/10/21  U/S Today:     32w 3d                                        EDD:   12/07/21  Best:          36w 2d     Det. By:  LMP  (02/03/21)          EDD:   11/10/21 ---------------------------------------------------------------------- Anatomy (Fetus B)  Stomach:               Appears normal, left   Bladder:                Appears normal                         sided ---------------------------------------------------------------------- Cervix Uterus Adnexa  Cervix  Not visualized (advanced GA >24wks) ---------------------------------------------------------------------- Comments  The patient is here for a a BPP for di-di twins and obesity.  She is at [redacted]w[redacted]d with EDD of 11/10/2021 dated by LMP  (02/03/21).  Sonographic findings  Di-Di intrauterine pregnancy at [redacted]w[redacted]d.  9% fetal discordance (B>A) on prior US.  Twin A  Normal cardiac activity  Breech presentation  Interval fetal anatomy appears normal.  MVP: 3.78 cm  Placenta is posterior  BPP 8/8  Twin B  Normal cardiac activity  Cephalic presentation  Interval fetal anatomy appears normal.  Amniotic fluid volume: MVP 3.39 cm.  Placenta is anterior  BPP 8/8  Recommendations  1. CD scheduled for 10/22/21. Standard OB precautions given. ----------------------------------------------------------------------                       Braxton Feathers, DO Electronically Signed Corrected Final Report  10/16/2021 01:41 pm ----------------------------------------------------------------------  [place] MFM FETAL BPP WO NST ADDL GESTATION  Result Date: 10/16/2021 ----------------------------------------------------------------------  OBSTETRICS REPORT                    (Corrected Final  10/16/2021 01:41 pm) ---------------------------------------------------------------------- Patient Info  ID #:       [id number]                          D.O.B.:  1990-11-02 (31 yrs)  Name:  CORITA ALLINSON [facility]                    Visit Date: 10/15/2021 11:35 am ---------------------------------------------------------------------- Performed By  Attending:        Braxton Feathers DO       Ref. Address:     [address]                                                             [place], [place]                                                             [address]  Performed By:     Emeline Darling BS,      [facility]  Referred By:       Bing MD ---------------------------------------------------------------------- Orders  #  Description                           Code        Ordered By  1  [place] MFM FETAL BPP WO NON               76819.01    YU FANG     STRESS  2  [place] MFM FETAL BPP WO NST               19147.8     YU FANG     ADDL GESTATION ----------------------------------------------------------------------  #  Order #                     Accession #                Episode #  1  [id number]                   [id number]  [id number]  2  [id number]                   [id number]                 [id number] ---------------------------------------------------------------------- Indications  Twin pregnancy, di/di, third trimester         O30.043  Pre-existing diabetes, type 2, in pregnancy,   O24.113  third trimester (insulin)  Obesity complicating pregnancy, third          O99.213  trimester (BMI 46)  Antenatal screening for elevated               Z36.1  alphafetoprotein level (AFP 3.27)  Abnormal chromosomal and genetic finding       O28.5  on antenatal screening of  mother  Chronic Hepatitis C complicating pregnancy,    O98.419 B18.2  antepartum  Low Risk NIPS/Negative Horizon  [redacted] weeks gestation of pregnancy                Z3A.36  Fetal echo wnl x2 ---------------------------------------------------------------------- Vital Signs  BP:          122/69 ---------------------------------------------------------------------- Fetal Evaluation (Fetus A)  Num Of Fetuses:         2  Fetal Heart Rate(bpm):  138  Cardiac Activity:       Observed  Fetal Lie:              Maternal lower left side  Presentation:           Breech  Membrane Desc:      Dividing Membrane seen - Dichorionic.  Amniotic Fluid  AFI FV:      Within normal limits                              Largest Pocket(cm)                              3.78 ---------------------------------------------------------------------- Biophysical Evaluation (Fetus A)  Amniotic F.V:   Pocket => 2 cm             F. Tone:        Observed  F. Movement:    Observed                   Score:          8/8  F. Breathing:   Observed ---------------------------------------------------------------------- OB History  Gravidity:    2         Term:   1        Prem:   0        SAB:   0  TOP:          0       Ectopic:  0        Living: 1 ---------------------------------------------------------------------- Gestational Age (Fetus A)  LMP:           36w 2d        Date:  02/03/21                 EDD:   11/10/21  Best:          Stevie Kern 2d     Det. By:  LMP  (02/03/21)          EDD:   11/10/21 ---------------------------------------------------------------------- Anatomy (Fetus A)  Stomach:  Appears normal, left   Bladder:                Appears normal                         sided ---------------------------------------------------------------------- Fetal Evaluation (Fetus B)  Num Of Fetuses:         2  Fetal Heart Rate(bpm):  132  Cardiac Activity:       Observed  Fetal Lie:              Maternal upper right side  Presentation:           Cephalic   Membrane Desc:      Dividing Membrane seen - Dichorionic.  Amniotic Fluid  AFI FV:      Within normal limits                              Largest Pocket(cm)                              3.39 ---------------------------------------------------------------------- Biophysical Evaluation (Fetus B)  Amniotic F.V:   Pocket => 2 cm             F. Tone:        Observed  F. Movement:    Observed                   Score:          8/8  F. Breathing:   Observed ---------------------------------------------------------------------- Biometry (Fetus B)  BPD:     80.82  mm     G. Age:  32w 3d        < 1  % ---------------------------------------------------------------------- Gestational Age (Fetus B)  LMP:           36w 2d        Date:  02/03/21                 EDD:   11/10/21  U/S Today:     32w 3d                                        EDD:   12/07/21  Best:          36w 2d     Det. By:  LMP  (02/03/21)          EDD:   11/10/21 ---------------------------------------------------------------------- Anatomy (Fetus B)  Stomach:               Appears normal, left   Bladder:                Appears normal                         sided ---------------------------------------------------------------------- Cervix Uterus Adnexa  Cervix  Not visualized (advanced GA >24wks) ---------------------------------------------------------------------- Comments  The patient is here for a a BPP for di-di twins and obesity.  She is at [redacted]w[redacted]d with EDD of 11/10/2021 dated by LMP  (02/03/21).  Sonographic findings  Di-Di intrauterine pregnancy at [redacted]w[redacted]d.  9% fetal discordance (B>A) on prior US.  Twin A  Normal cardiac activity  Breech presentation  Interval fetal anatomy appears normal.  MVP: 3.78 cm  Placenta is posterior  BPP 8/8  Twin B  Normal cardiac activity  Cephalic presentation  Interval fetal anatomy appears normal.  Amniotic fluid volume: MVP 3.39 cm.  Placenta is anterior  BPP 8/8  Recommendations  1. CD scheduled for 10/22/21. Standard OB  precautions given. ----------------------------------------------------------------------                       Braxton Feathers, DO Electronically Signed Corrected Final Report  10/16/2021 01:41 pm ----------------------------------------------------------------------  [place] MFM OB FOLLOW UP  Result Date: 10/09/2021 ----------------------------------------------------------------------  OBSTETRICS REPORT                       (Signed Final 10/09/2021 08:30 am) ---------------------------------------------------------------------- Patient Info  ID #:       [id number]                          D.O.B.:  Aug 13, 1990 (31 yrs)  Name:       GENESSA BEMAN [facility]                    Visit Date: 10/08/2021 03:43 pm ---------------------------------------------------------------------- Performed By  Attending:        Braxton Feathers, DO      Ref. Address:     [address]                                                             [place], [place]                                                             [address]  Performed By:     Emeline Darling BS,      [facility]  Referred By:      Magnolia Bing MD ---------------------------------------------------------------------- Orders  #  Description                           Code        Ordered By  1  [place] MFM FETAL BPP                      [id number]     YU FANG     W/NONSTRESS  2  [place] MFM OB FOLLOW UP                   [id number]    YU FANG  3  [place] MFM FETAL BPP                      [address].6     YU FANG     W/NONSTRESS ADD'L GEST  4  [place] MFM OB FOLLOW UP ADDL              04540.98    YU FANG     GEST ----------------------------------------------------------------------  #  Order #                     Accession #                Episode #  1  [id number]                    [id number]                 [id number]  2  [id number]                   [id number]                 [id number]  3  [id number]                   [id number]                 [id number]  4  [id number]                   [id number]                 [id number] ---------------------------------------------------------------------- Indications  Twin pregnancy, di/di, third trimester         O30.043  Pre-existing diabetes, type 2, in pregnancy,   O24.113  third trimester (insulin)  Obesity complicating pregnancy, third          O99.213  trimester (BMI 46)  Antenatal screening for elevated               Z36.1  alphafetoprotein level (AFP 3.27)  Abnormal chromosomal and genetic finding       O28.5  on antenatal screening of mother  Chronic Hepatitis C complicating pregnancy,    O98.419 B18.2  antepartum  Low Risk NIPS/Negative Horizon  [redacted] weeks gestation of pregnancy                Z3A.35  Fetal echo wnl x2 ---------------------------------------------------------------------- Fetal Evaluation (Fetus A)  Num Of Fetuses:         2  Fetal Heart Rate(bpm):  141  Cardiac Activity:       Observed  Fetal Lie:              Lower Left Fetus  Presentation:           Breech  Placenta:  Posterior  P. Cord Insertion:      Previously Visualized  Membrane Desc:      Dividing Membrane seen - Dichorionic.  Amniotic Fluid  AFI FV:      Within normal limits                              Largest Pocket(cm)                              3.72 ---------------------------------------------------------------------- Biophysical Evaluation (Fetus A)  Amniotic F.V:   Pocket => 2 cm             F. Tone:        Observed  F. Movement:    Observed                   N.S.T:          Reactive  F. Breathing:   Not Observed               Score:          8/10 ---------------------------------------------------------------------- Biometry (Fetus A)  BPD:     82.85  mm     G. Age:  33w 2d          8  %    CI:        72.23   %    70 - 86                                                           FL/HC:      21.7   %    20.1 - 22.3  HC:    310.17   mm     G. Age:  34w 5d          9  %    HC/AC:      1.02        0.93 - 1.11  AC:    303.59   mm     G. Age:  34w 2d         29  %    FL/BPD:     81.1   %    71 - 87  FL:      67.17  mm     G. Age:  34w 4d         25  %    FL/AC:      22.1   %    20 - 24  Est. FW:    2401  gm      5 lb 5 oz     22  %     FW Discordancy         9  % ---------------------------------------------------------------------- OB History  Gravidity:    2         Term:   1        Prem:   0        SAB:   0  TOP:          0       Ectopic:  0        Living: 1 ---------------------------------------------------------------------- Gestational Age (Fetus A)  LMP:           35w 2d        Date:  02/03/21                 EDD:   11/10/21  U/S Today:     34w 2d                                        EDD:   11/17/21  Best:          35w 2d     Det. By:  LMP  (02/03/21)          EDD:   11/10/21 ---------------------------------------------------------------------- Anatomy (Fetus A)  Cranium:               Appears normal         Aortic Arch:            Previously seen  Cavum:                 Previously seen        Ductal Arch:            Not well visualized  Ventricles:            Appears normal         Diaphragm:              Appears normal  Choroid Plexus:        Previously seen        Stomach:                Appears normal, left                                                                        sided  Cerebellum:            Previously seen        Abdomen:                Appears normal  Posterior Fossa:       Previously seen        Abdominal Wall:         Previously seen  Nuchal Fold:           Previously seen        Cord Vessels:           Previously seen  Face:                  Orbits and profile     Kidneys:                Previously seen                         previously seen  Lips:                  Previously seen        Bladder:                Appears  normal  Thoracic:  Appears normal         Spine:                  Previously seen  Heart:                 Previously seen        Upper Extremities:      Previously seen  RVOT:                  Previously seen        Lower Extremities:      Previously seen  LVOT:                  Previously seen  Other:  3VV and 3VTV previously visualized.Female gender previously seen. ---------------------------------------------------------------------- Fetal Evaluation (Fetus B)  Num Of Fetuses:         2  Fetal Heart Rate(bpm):  129  Cardiac Activity:       Observed  Fetal Lie:              Upper Right Fetus  Presentation:           Cephalic  Placenta:               Anterior  P. Cord Insertion:      Previously Visualized  Membrane Desc:      Dividing Membrane seen - Dichorionic.  Amniotic Fluid  AFI FV:      Within normal limits                              Largest Pocket(cm)                              3.97 ---------------------------------------------------------------------- Biophysical Evaluation (Fetus B)  Amniotic F.V:   Within normal limits       F. Tone:        Observed  F. Movement:    Observed                   N.S.T:          Reactive  F. Breathing:   Observed                   Score:          10/10 ---------------------------------------------------------------------- Biometry (Fetus B)  BPD:     84.82  mm     G. Age:  34w 1d         23  %    CI:        73.88   %    70 - 86                                                          FL/HC:      22.7   %    20.1 - 22.3  HC:    313.42   mm     G. Age:  35w 1d         15  %    HC/AC:      1.01        0.93 - 1.11  AC:    309.14  mm     G. Age:  34w 6d         45  %    FL/BPD:     83.9   %    71 - 87  FL:       71.2  mm     G. Age:  36w 3d         74  %    FL/AC:      23.0   %    20 - 24  Est. FW:    2631  gm    5 lb 13 oz      47  %     FW Discordancy      0 \ 9 % ---------------------------------------------------------------------- Gestational Age (Fetus B)   LMP:           35w 2d        Date:  02/03/21                 EDD:   11/10/21  U/S Today:     35w 1d                                        EDD:   11/11/21  Best:          35w 2d     Det. By:  LMP  (02/03/21)          EDD:   11/10/21 ---------------------------------------------------------------------- Anatomy (Fetus B)  Cranium:               Appears normal         Aortic Arch:            Previously seen  Cavum:                 Appears normal         Ductal Arch:            Not well visualized  Ventricles:            Appears normal         Diaphragm:              Appears normal  Choroid Plexus:        Previously seen        Stomach:                Appears normal, left                                                                        sided  Cerebellum:            Previously seen        Abdomen:                Previously seen  Posterior Fossa:       Previously seen        Abdominal Wall:         Previously seen  Nuchal Fold:           Previously seen        Cord Vessels:  Previously seen  Face:                  Orbits prev seen ,     Kidneys:                Appear normal                         profile not seen  Lips:                  Previously seen        Bladder:                Appears normal  Thoracic:              Appears normal         Spine:                  Previously seen  Heart:                 Previously seen        Upper Extremities:      Previously seen  RVOT:                  Not well visualized    Lower Extremities:      Previously seen  LVOT:                  Previously seen  Other:  Female gender previously seen. 3VV and 3VTV previously visualized. ---------------------------------------------------------------------- Cervix Uterus Adnexa  Cervix  Not visualized (advanced GA >24wks) ---------------------------------------------------------------------- Comments  The patient is here for a a BPP for di-di twins and obesity.  She is at 36w 1d with EDD of 11/04/2021 dated by LMP  (01/28/21).   Sonographic findings  Di-Di intrauterine pregnancy at at  35w 2d.  9% fetal discordance.  Twin A  Normal cardiac activity  Breech presentation  Interval fetal anatomy appears normal.  Fetal biometry shows the estimated fetal weight at the 22  percentile  MVP: 3.72 cm  Placenta is posterior  BPP 8/10 (-2 for breathing)  Twin B  Normal cardiac activity  Cephalic presentation  Interval fetal anatomy appears normal.  Fetal biometry shows the estimated fetal weight at the 47  percentile  Amniotic fluid volume: MVP 3.97 cm.  Placenta is anterior  BPP 10/10  Recommendations  1. BBPs weekly until delivery  2. Growth ultrasounds every 4 weeks until delivery  2. Delivery is schedule for 37w via cesarean section ----------------------------------------------------------------------                  Braxton Feathers, DO Electronically Signed Final Report   10/09/2021 08:30 am ----------------------------------------------------------------------  [place] MFM OB FOLLOW UP ADDL GEST  Result Date: 10/09/2021 ----------------------------------------------------------------------  OBSTETRICS REPORT                       (Signed Final 10/09/2021 08:30 am) ---------------------------------------------------------------------- Patient Info  ID #:       [id number]                          D.O.B.:  12-30-1990 (31 yrs)  Name:       ALIVIYAH MALANGA [facility]                    Visit Date: 10/08/2021 03:43 pm ---------------------------------------------------------------------- Performed By  Attending:  [employer], DO      Ref. Address:     [address]                                                             [place], [place]                                                             [address]  Performed By:     Emeline Darling BS,      [facility]                                     [facility]  Referred By:      Terlingua Bing MD ---------------------------------------------------------------------- Orders  #  Description                           Code        Ordered By  1  [place] MFM FETAL BPP                      19147.8     YU FANG     W/NONSTRESS  2  [place] MFM OB FOLLOW UP                   76816.01    YU FANG  3  [place] MFM FETAL BPP                      29562.1     YU FANG     W/NONSTRESS ADD'L GEST  4  [place] MFM OB FOLLOW UP ADDL              30865.78    YU FANG     GEST ----------------------------------------------------------------------  #  Order #                     Accession #  Episode #  1  [id number]                   [id number]                 [id number]  2  [id number]                   [id number]                 [id number]  3  [id number]                   [id number]                 [id number]  4  [id number]                   [id number]                 [id number] ---------------------------------------------------------------------- Indications  Twin pregnancy, di/di, third trimester         O30.043  Pre-existing diabetes, type 2, in pregnancy,   O24.113  third trimester (insulin)  Obesity complicating pregnancy, third          O99.213  trimester (BMI 46)  Antenatal screening for elevated               Z36.1  alphafetoprotein level (AFP 3.27)  Abnormal chromosomal and genetic finding       O28.5  on antenatal screening of mother  Chronic Hepatitis C complicating pregnancy,    O98.419 B18.2  antepartum  Low Risk NIPS/Negative Horizon  [redacted] weeks gestation of pregnancy                Z3A.35  Fetal echo wnl x2 ---------------------------------------------------------------------- Fetal Evaluation (Fetus A)  Num Of Fetuses:         2  Fetal Heart Rate(bpm):  141  Cardiac Activity:       Observed  Fetal Lie:              Lower Left Fetus  Presentation:           Breech  Placenta:               Posterior  P. Cord Insertion:      Previously Visualized   Membrane Desc:      Dividing Membrane seen - Dichorionic.  Amniotic Fluid  AFI FV:      Within normal limits                              Largest Pocket(cm)                              3.72 ---------------------------------------------------------------------- Biophysical Evaluation (Fetus A)  Amniotic F.V:   Pocket => 2 cm             F. Tone:        Observed  F. Movement:    Observed                   N.S.T:          Reactive  F. Breathing:   Not Observed               Score:          8/10 ---------------------------------------------------------------------- Biometry (Fetus A)  BPD:     82.85  mm     G. Age:  33w 2d          8  %    CI:        72.23   %    70 - 86                                                          FL/HC:      21.7   %    20.1 - 22.3  HC:    310.17   mm     G. Age:  34w 5d          9  %    HC/AC:      1.02        0.93 - 1.11  AC:    303.59   mm     G. Age:  34w 2d         29  %    FL/BPD:     81.1   %    71 - 87  FL:      67.17  mm     G. Age:  34w 4d         25  %    FL/AC:      22.1   %    20 - 24  Est. FW:    2401  gm      5 lb 5 oz     22  %     FW Discordancy         9  % ---------------------------------------------------------------------- OB History  Gravidity:    2         Term:   1        Prem:   0        SAB:   0  TOP:          0       Ectopic:  0        Living: 1 ---------------------------------------------------------------------- Gestational Age (Fetus A)  LMP:           35w 2d        Date:  02/03/21                 EDD:   11/10/21  U/S Today:     34w 2d                                        EDD:   11/17/21  Best:          35w 2d     Det. By:  LMP  (02/03/21)          EDD:   11/10/21 ---------------------------------------------------------------------- Anatomy (Fetus A)  Cranium:               Appears normal         Aortic Arch:            Previously seen  Cavum:                 Previously seen        Ductal Arch:            Not  well visualized  Ventricles:            Appears  normal         Diaphragm:              Appears normal  Choroid Plexus:        Previously seen        Stomach:                Appears normal, left                                                                        sided  Cerebellum:            Previously seen        Abdomen:                Appears normal  Posterior Fossa:       Previously seen        Abdominal Wall:         Previously seen  Nuchal Fold:           Previously seen        Cord Vessels:           Previously seen  Face:                  Orbits and profile     Kidneys:                Previously seen                         previously seen  Lips:                  Previously seen        Bladder:                Appears normal  Thoracic:              Appears normal         Spine:                  Previously seen  Heart:                 Previously seen        Upper Extremities:      Previously seen  RVOT:                  Previously seen        Lower Extremities:      Previously seen  LVOT:                  Previously seen  Other:  3VV and 3VTV previously visualized.Female gender previously seen. ---------------------------------------------------------------------- Fetal Evaluation (Fetus B)  Num Of Fetuses:         2  Fetal Heart Rate(bpm):  129  Cardiac Activity:       Observed  Fetal Lie:              Upper Right Fetus  Presentation:           Cephalic  Placenta:  Anterior  P. Cord Insertion:      Previously Visualized  Membrane Desc:      Dividing Membrane seen - Dichorionic.  Amniotic Fluid  AFI FV:      Within normal limits                              Largest Pocket(cm)                              3.97 ---------------------------------------------------------------------- Biophysical Evaluation (Fetus B)  Amniotic F.V:   Within normal limits       F. Tone:        Observed  F. Movement:    Observed                   N.S.T:          Reactive  F. Breathing:   Observed                   Score:          10/10  ---------------------------------------------------------------------- Biometry (Fetus B)  BPD:     84.82  mm     G. Age:  34w 1d         23  %    CI:        73.88   %    70 - 86                                                          FL/HC:      22.7   %    20.1 - 22.3  HC:    313.42   mm     G. Age:  35w 1d         15  %    HC/AC:      1.01        0.93 - 1.11  AC:    309.14   mm     G. Age:  34w 6d         45  %    FL/BPD:     83.9   %    71 - 87  FL:       71.2  mm     G. Age:  36w 3d         74  %    FL/AC:      23.0   %    20 - 24  Est. FW:    2631  gm    5 lb 13 oz      47  %     FW Discordancy      0 \ 9 % ---------------------------------------------------------------------- Gestational Age (Fetus B)  LMP:           35w 2d        Date:  02/03/21                 EDD:   11/10/21  U/S Today:     35w 1d  EDD:   11/11/21  Best:          35w 2d     Det. By:  LMP  (02/03/21)          EDD:   11/10/21 ---------------------------------------------------------------------- Anatomy (Fetus B)  Cranium:               Appears normal         Aortic Arch:            Previously seen  Cavum:                 Appears normal         Ductal Arch:            Not well visualized  Ventricles:            Appears normal         Diaphragm:              Appears normal  Choroid Plexus:        Previously seen        Stomach:                Appears normal, left                                                                        sided  Cerebellum:            Previously seen        Abdomen:                Previously seen  Posterior Fossa:       Previously seen        Abdominal Wall:         Previously seen  Nuchal Fold:           Previously seen        Cord Vessels:           Previously seen  Face:                  Orbits prev seen ,     Kidneys:                Appear normal                         profile not seen  Lips:                  Previously seen        Bladder:                Appears normal   Thoracic:              Appears normal         Spine:                  Previously seen  Heart:                 Previously seen        Upper Extremities:      Previously seen  RVOT:  Not well visualized    Lower Extremities:      Previously seen  LVOT:                  Previously seen  Other:  Female gender previously seen. 3VV and 3VTV previously visualized. ---------------------------------------------------------------------- Cervix Uterus Adnexa  Cervix  Not visualized (advanced GA >24wks) ---------------------------------------------------------------------- Comments  The patient is here for a a BPP for di-di twins and obesity.  She is at 36w 1d with EDD of 11/04/2021 dated by LMP  (01/28/21).  Sonographic findings  Di-Di intrauterine pregnancy at at  35w 2d.  9% fetal discordance.  Twin A  Normal cardiac activity  Breech presentation  Interval fetal anatomy appears normal.  Fetal biometry shows the estimated fetal weight at the 22  percentile  MVP: 3.72 cm  Placenta is posterior  BPP 8/10 (-2 for breathing)  Twin B  Normal cardiac activity  Cephalic presentation  Interval fetal anatomy appears normal.  Fetal biometry shows the estimated fetal weight at the 47  percentile  Amniotic fluid volume: MVP 3.97 cm.  Placenta is anterior  BPP 10/10  Recommendations  1. BBPs weekly until delivery  2. Growth ultrasounds every 4 weeks until delivery  2. Delivery is schedule for 37w via cesarean section ----------------------------------------------------------------------                  Braxton Feathers, DO Electronically Signed Final Report   10/09/2021 08:30 am ----------------------------------------------------------------------  [place] MFM FETAL BPP W/NONSTRESS  Result Date: 10/09/2021 ----------------------------------------------------------------------  OBSTETRICS REPORT                       (Signed Final 10/09/2021 08:30 am) ----------------------------------------------------------------------  Patient Info  ID #:       [id number]                          D.O.B.:  [birth year] (31 yrs)  Name:       MYKHIA DANISH [facility]                    Visit Date: 10/08/2021 03:43 pm ---------------------------------------------------------------------- Performed By  Attending:        Braxton Feathers, DO      Ref. Address:     [address]                                                             [place], [place]                                                             [address]  Performed By:     Emeline Darling BS,      [facility]  Referred By:      Jerusalem Bing MD ---------------------------------------------------------------------- Orders  #  Description                           Code        Ordered By  1  [place] MFM FETAL BPP                      16109.6     YU FANG     W/NONSTRESS  2  [place] MFM OB FOLLOW UP                   76816.01    YU FANG  3  [place] MFM FETAL BPP                      04540.9     YU FANG     W/NONSTRESS ADD'L GEST  4  [place] MFM OB FOLLOW UP ADDL              81191.47    YU FANG     GEST ----------------------------------------------------------------------  #  Order #                     Accession #                Episode #  1  [id number]                   [id number]                 [id number]  2  [id number]                   [id number]                 [id number]  3  [id number]                   [id number]                 [id number]  4  [id number]                   [id number]                 [id number] ---------------------------------------------------------------------- Indications  Twin pregnancy, di/di, third trimester         O30.043  Pre-existing diabetes, type 2, in pregnancy,   O24.113  third trimester (insulin)  Obesity complicating pregnancy, third          O99.213   trimester (BMI  46)  Antenatal screening for elevated               Z36.1  alphafetoprotein level (AFP 3.27)  Abnormal chromosomal and genetic finding       O28.5  on antenatal screening of mother  Chronic Hepatitis C complicating pregnancy,    O98.419 B18.2  antepartum  Low Risk NIPS/Negative Horizon  [redacted] weeks gestation of pregnancy                Z3A.35  Fetal echo wnl x2 ---------------------------------------------------------------------- Fetal Evaluation (Fetus A)  Num Of Fetuses:         2  Fetal Heart Rate(bpm):  141  Cardiac Activity:       Observed  Fetal Lie:              Lower Left Fetus  Presentation:           Breech  Placenta:               Posterior  P. Cord Insertion:      Previously Visualized  Membrane Desc:      Dividing Membrane seen - Dichorionic.  Amniotic Fluid  AFI FV:      Within normal limits                              Largest Pocket(cm)                              3.72 ---------------------------------------------------------------------- Biophysical Evaluation (Fetus A)  Amniotic F.V:   Pocket => 2 cm             F. Tone:        Observed  F. Movement:    Observed                   N.S.T:          Reactive  F. Breathing:   Not Observed               Score:          8/10 ---------------------------------------------------------------------- Biometry (Fetus A)  BPD:     82.85  mm     G. Age:  33w 2d          8  %    CI:        72.23   %    70 - 86                                                          FL/HC:      21.7   %    20.1 - 22.3  HC:    310.17   mm     G. Age:  34w 5d          9  %    HC/AC:      1.02        0.93 - 1.11  AC:    303.59   mm     G. Age:  34w 2d         29  %    FL/BPD:     81.1   %    71 -  87  FL:      67.17  mm     G. Age:  34w 4d         25  %    FL/AC:      22.1   %    20 - 24  Est. FW:    2401  gm      5 lb 5 oz     22  %     FW Discordancy         9  % ---------------------------------------------------------------------- OB History  Gravidity:    2          Term:   1        Prem:   0        SAB:   0  TOP:          0       Ectopic:  0        Living: 1 ---------------------------------------------------------------------- Gestational Age (Fetus A)  LMP:           35w 2d        Date:  02/03/21                 EDD:   11/10/21  U/S Today:     34w 2d                                        EDD:   11/17/21  Best:          35w 2d     Det. By:  LMP  (02/03/21)          EDD:   11/10/21 ---------------------------------------------------------------------- Anatomy (Fetus A)  Cranium:               Appears normal         Aortic Arch:            Previously seen  Cavum:                 Previously seen        Ductal Arch:            Not well visualized  Ventricles:            Appears normal         Diaphragm:              Appears normal  Choroid Plexus:        Previously seen        Stomach:                Appears normal, left                                                                        sided  Cerebellum:            Previously seen        Abdomen:                Appears normal  Posterior Fossa:       Previously seen        Abdominal Wall:  Previously seen  Nuchal Fold:           Previously seen        Cord Vessels:           Previously seen  Face:                  Orbits and profile     Kidneys:                Previously seen                         previously seen  Lips:                  Previously seen        Bladder:                Appears normal  Thoracic:              Appears normal         Spine:                  Previously seen  Heart:                 Previously seen        Upper Extremities:      Previously seen  RVOT:                  Previously seen        Lower Extremities:      Previously seen  LVOT:                  Previously seen  Other:  3VV and 3VTV previously visualized.Female gender previously seen. ---------------------------------------------------------------------- Fetal Evaluation (Fetus B)  Num Of Fetuses:         2  Fetal Heart Rate(bpm):  129   Cardiac Activity:       Observed  Fetal Lie:              Upper Right Fetus  Presentation:           Cephalic  Placenta:               Anterior  P. Cord Insertion:      Previously Visualized  Membrane Desc:      Dividing Membrane seen - Dichorionic.  Amniotic Fluid  AFI FV:      Within normal limits                              Largest Pocket(cm)                              3.97 ---------------------------------------------------------------------- Biophysical Evaluation (Fetus B)  Amniotic F.V:   Within normal limits       F. Tone:        Observed  F. Movement:    Observed                   N.S.T:          Reactive  F. Breathing:   Observed                   Score:          10/10 ---------------------------------------------------------------------- Biometry (Fetus B)  BPD:     84.82  mm  G. Age:  34w 1d         23  %    CI:        73.88   %    70 - 86                                                          FL/HC:      22.7   %    20.1 - 22.3  HC:    313.42   mm     G. Age:  35w 1d         15  %    HC/AC:      1.01        0.93 - 1.11  AC:    309.14   mm     G. Age:  34w 6d         45  %    FL/BPD:     83.9   %    71 - 87  FL:       71.2  mm     G. Age:  36w 3d         74  %    FL/AC:      23.0   %    20 - 24  Est. FW:    2631  gm    5 lb 13 oz      47  %     FW Discordancy      0 \ 9 % ---------------------------------------------------------------------- Gestational Age (Fetus B)  LMP:           35w 2d        Date:  02/03/21                 EDD:   11/10/21  U/S Today:     35w 1d                                        EDD:   11/11/21  Best:          35w 2d     Det. By:  LMP  (02/03/21)          EDD:   11/10/21 ---------------------------------------------------------------------- Anatomy (Fetus B)  Cranium:               Appears normal         Aortic Arch:            Previously seen  Cavum:                 Appears normal         Ductal Arch:            Not well visualized  Ventricles:            Appears normal          Diaphragm:              Appears normal  Choroid Plexus:        Previously seen        Stomach:                Appears normal, left  sided  Cerebellum:            Previously seen        Abdomen:                Previously seen  Posterior Fossa:       Previously seen        Abdominal Wall:         Previously seen  Nuchal Fold:           Previously seen        Cord Vessels:           Previously seen  Face:                  Orbits prev seen ,     Kidneys:                Appear normal                         profile not seen  Lips:                  Previously seen        Bladder:                Appears normal  Thoracic:              Appears normal         Spine:                  Previously seen  Heart:                 Previously seen        Upper Extremities:      Previously seen  RVOT:                  Not well visualized    Lower Extremities:      Previously seen  LVOT:                  Previously seen  Other:  Female gender previously seen. 3VV and 3VTV previously visualized. ---------------------------------------------------------------------- Cervix Uterus Adnexa  Cervix  Not visualized (advanced GA >24wks) ---------------------------------------------------------------------- Comments  The patient is here for a a BPP for di-di twins and obesity.  She is at 36w 1d with EDD of 11/04/2021 dated by LMP  (01/28/21).  Sonographic findings  Di-Di intrauterine pregnancy at at  35w 2d.  9% fetal discordance.  Twin A  Normal cardiac activity  Breech presentation  Interval fetal anatomy appears normal.  Fetal biometry shows the estimated fetal weight at the 22  percentile  MVP: 3.72 cm  Placenta is posterior  BPP 8/10 (-2 for breathing)  Twin B  Normal cardiac activity  Cephalic presentation  Interval fetal anatomy appears normal.  Fetal biometry shows the estimated fetal weight at the 47  percentile  Amniotic fluid volume: MVP 3.97 cm.  Placenta is  anterior  BPP 10/10  Recommendations  1. BBPs weekly until delivery  2. Growth ultrasounds every 4 weeks until delivery  2. Delivery is schedule for 37w via cesarean section ----------------------------------------------------------------------                  Braxton Feathers, DO Electronically Signed Final Report   10/09/2021 08:30 am ----------------------------------------------------------------------  [place] MFM FETAL BPP W/NONSTRESS ADD'L GEST  Result Date: 10/09/2021 ----------------------------------------------------------------------  OBSTETRICS REPORT                       (  Signed Final 10/09/2021 08:30 am) ---------------------------------------------------------------------- Patient Info  ID #:       [id number]                          D.O.B.:  [birth year] (31 yrs)  Name:       ALTHEIA SHAFRAN [facility]                    Visit Date: 10/08/2021 03:43 pm ---------------------------------------------------------------------- Performed By  Attending:        Braxton Feathers, DO      Ref. Address:     [address]                                                             [place], [place]                                                             [address]  Performed By:     Emeline Darling BS,      [facility]  Referred By:      Casselman Bing MD ---------------------------------------------------------------------- Orders  #  Description                           Code        Ordered By  1  [place] MFM FETAL BPP                      19147.8     YU FANG     W/NONSTRESS  2  [place] MFM OB FOLLOW UP                   29562.13    YU FANG  3  [place] MFM FETAL BPP  16109.6     YU FANG     W/NONSTRESS ADD'L GEST  4  [place] MFM OB FOLLOW UP ADDL              04540.98    YU FANG      GEST ----------------------------------------------------------------------  #  Order #                     Accession #                Episode #  1  [id number]                   [id number]                 [id number]  2  [id number]                   [id number]                 [id number]  3  [id number]                   [id number]                 [id number]  4  [id number]                   [id number]                 [id number] ---------------------------------------------------------------------- Indications  Twin pregnancy, di/di, third trimester         O30.043  Pre-existing diabetes, type 2, in pregnancy,   O24.113  third trimester (insulin)  Obesity complicating pregnancy, third          O99.213  trimester (BMI 46)  Antenatal screening for elevated               Z36.1  alphafetoprotein level (AFP 3.27)  Abnormal chromosomal and genetic finding       O28.5  on antenatal screening of mother  Chronic Hepatitis C complicating pregnancy,    O98.419 B18.2  antepartum  Low Risk NIPS/Negative Horizon  [redacted] weeks gestation of pregnancy                Z3A.35  Fetal echo wnl x2 ---------------------------------------------------------------------- Fetal Evaluation (Fetus A)  Num Of Fetuses:         2  Fetal Heart Rate(bpm):  141  Cardiac Activity:       Observed  Fetal Lie:              Lower Left Fetus  Presentation:           Breech  Placenta:               Posterior  P. Cord Insertion:      Previously Visualized  Membrane Desc:      Dividing Membrane seen - Dichorionic.  Amniotic Fluid  AFI FV:      Within normal limits                              Largest Pocket(cm)                              3.72 ---------------------------------------------------------------------- Biophysical Evaluation (Fetus A)  Amniotic F.V:   Pocket => 2 cm  F. Tone:        Observed  F. Movement:    Observed                   N.S.T:          Reactive  F. Breathing:   Not Observed               Score:          8/10  ---------------------------------------------------------------------- Biometry (Fetus A)  BPD:     82.85  mm     G. Age:  33w 2d          8  %    CI:        72.23   %    70 - 86                                                          FL/HC:      21.7   %    20.1 - 22.3  HC:    310.17   mm     G. Age:  34w 5d          9  %    HC/AC:      1.02        0.93 - 1.11  AC:    303.59   mm     G. Age:  34w 2d         29  %    FL/BPD:     81.1   %    71 - 87  FL:      67.17  mm     G. Age:  34w 4d         25  %    FL/AC:      22.1   %    20 - 24  Est. FW:    2401  gm      5 lb 5 oz     22  %     FW Discordancy         9  % ---------------------------------------------------------------------- OB History  Gravidity:    2         Term:   1        Prem:   0        SAB:   0  TOP:          0       Ectopic:  0        Living: 1 ---------------------------------------------------------------------- Gestational Age (Fetus A)  LMP:           35w 2d        Date:  02/03/21                 EDD:   11/10/21  U/S Today:     34w 2d                                        EDD:   11/17/21  Best:          35w 2d     Det. By:  LMP  (02/03/21)  EDD:   11/10/21 ---------------------------------------------------------------------- Anatomy (Fetus A)  Cranium:               Appears normal         Aortic Arch:            Previously seen  Cavum:                 Previously seen        Ductal Arch:            Not well visualized  Ventricles:            Appears normal         Diaphragm:              Appears normal  Choroid Plexus:        Previously seen        Stomach:                Appears normal, left                                                                        sided  Cerebellum:            Previously seen        Abdomen:                Appears normal  Posterior Fossa:       Previously seen        Abdominal Wall:         Previously seen  Nuchal Fold:           Previously seen        Cord Vessels:           Previously seen  Face:                   Orbits and profile     Kidneys:                Previously seen                         previously seen  Lips:                  Previously seen        Bladder:                Appears normal  Thoracic:              Appears normal         Spine:                  Previously seen  Heart:                 Previously seen        Upper Extremities:      Previously seen  RVOT:                  Previously seen        Lower Extremities:      Previously seen  LVOT:  Previously seen  Other:  3VV and 3VTV previously visualized.Female gender previously seen. ---------------------------------------------------------------------- Fetal Evaluation (Fetus B)  Num Of Fetuses:         2  Fetal Heart Rate(bpm):  129  Cardiac Activity:       Observed  Fetal Lie:              Upper Right Fetus  Presentation:           Cephalic  Placenta:               Anterior  P. Cord Insertion:      Previously Visualized  Membrane Desc:      Dividing Membrane seen - Dichorionic.  Amniotic Fluid  AFI FV:      Within normal limits                              Largest Pocket(cm)                              3.97 ---------------------------------------------------------------------- Biophysical Evaluation (Fetus B)  Amniotic F.V:   Within normal limits       F. Tone:        Observed  F. Movement:    Observed                   N.S.T:          Reactive  F. Breathing:   Observed                   Score:          10/10 ---------------------------------------------------------------------- Biometry (Fetus B)  BPD:     84.82  mm     G. Age:  34w 1d         23  %    CI:        73.88   %    70 - 86                                                          FL/HC:      22.7   %    20.1 - 22.3  HC:    313.42   mm     G. Age:  35w 1d         15  %    HC/AC:      1.01        0.93 - 1.11  AC:    309.14   mm     G. Age:  34w 6d         45  %    FL/BPD:     83.9   %    71 - 87  FL:       71.2  mm     G. Age:  36w 3d         74  %    FL/AC:      23.0    %    20 - 24  Est. FW:    2631  gm    5 lb 13 oz      47  %     FW Discordancy  0 \ 9 % ---------------------------------------------------------------------- Gestational Age (Fetus B)  LMP:           35w 2d        Date:  02/03/21                 EDD:   11/10/21  U/S Today:     35w 1d                                        EDD:   11/11/21  Best:          35w 2d     Det. By:  LMP  (02/03/21)          EDD:   11/10/21 ---------------------------------------------------------------------- Anatomy (Fetus B)  Cranium:               Appears normal         Aortic Arch:            Previously seen  Cavum:                 Appears normal         Ductal Arch:            Not well visualized  Ventricles:            Appears normal         Diaphragm:              Appears normal  Choroid Plexus:        Previously seen        Stomach:                Appears normal, left                                                                        sided  Cerebellum:            Previously seen        Abdomen:                Previously seen  Posterior Fossa:       Previously seen        Abdominal Wall:         Previously seen  Nuchal Fold:           Previously seen        Cord Vessels:           Previously seen  Face:                  Orbits prev seen ,     Kidneys:                Appear normal                         profile not seen  Lips:                  Previously seen        Bladder:  Appears normal  Thoracic:              Appears normal         Spine:                  Previously seen  Heart:                 Previously seen        Upper Extremities:      Previously seen  RVOT:                  Not well visualized    Lower Extremities:      Previously seen  LVOT:                  Previously seen  Other:  Female gender previously seen. 3VV and 3VTV previously visualized. ---------------------------------------------------------------------- Cervix Uterus Adnexa  Cervix  Not visualized (advanced GA >24wks)  ---------------------------------------------------------------------- Comments  The patient is here for a a BPP for di-di twins and obesity.  She is at 36w 1d with EDD of 11/04/2021 dated by LMP  (01/28/21).  Sonographic findings  Di-Di intrauterine pregnancy at at  35w 2d.  9% fetal discordance.  Twin A  Normal cardiac activity  Breech presentation  Interval fetal anatomy appears normal.  Fetal biometry shows the estimated fetal weight at the 22  percentile  MVP: 3.72 cm  Placenta is posterior  BPP 8/10 (-2 for breathing)  Twin B  Normal cardiac activity  Cephalic presentation  Interval fetal anatomy appears normal.  Fetal biometry shows the estimated fetal weight at the 47  percentile  Amniotic fluid volume: MVP 3.97 cm.  Placenta is anterior  BPP 10/10  Recommendations  1. BBPs weekly until delivery  2. Growth ultrasounds every 4 weeks until delivery  2. Delivery is schedule for 37w via cesarean section ----------------------------------------------------------------------                  Braxton Feathers, DO Electronically Signed Final Report   10/09/2021 08:30 am ----------------------------------------------------------------------  [place] MFM FETAL BPP WO NON STRESS  Result Date: 10/01/2021 ----------------------------------------------------------------------  OBSTETRICS REPORT                       (Signed Final 10/01/2021 02:45 pm) ---------------------------------------------------------------------- Patient Info  ID #:       [id number]                          D.O.B.:  06-Jul-1990 (31 yrs)  Name:       CRISTEL RAIL [facility]                    Visit Date: 10/01/2021 01:14 pm ---------------------------------------------------------------------- Performed By  Attending:        Braxton Feathers, DO      Ref. Address:     [address]                                                             [place], [place]  [address]  Performed By:     Reinaldo Raddle            Location[facility]                                     [facility]  Referred By:      Murray Bing MD ---------------------------------------------------------------------- Orders  #  Description                           Code        Ordered By  1  [place] MFM FETAL BPP WO NON               76819.01    YU FANG     STRESS  2  [place] MFM FETAL BPP WO NST               76819.1     YU FANG     ADDL GESTATION ----------------------------------------------------------------------  #  Order #                     Accession #                Episode #  1  [id number]                   [id number]                 [id number]  2  [id number]                   [id number]                 [id number] ---------------------------------------------------------------------- Indications  Twin pregnancy, di/di, third trimester         O30.043  Pre-existing diabetes, type 2, in pregnancy,   O24.113  third trimester (insulin)  Obesity complicating pregnancy, third          O99.213  trimester (BMI 46)  Antenatal screening for elevated               Z36.1  alphafetoprotein level (AFP 3.27)  Abnormal chromosomal and genetic finding       O28.5  on antenatal screening of mother  Chronic Hepatitis C complicating pregnancy,    O98.419 B18.2  antepartum  Low Risk NIPS/Negative Horizon  Fetal echo wnl x2  [redacted] weeks gestation of pregnancy                Z3A.34 ---------------------------------------------------------------------- Fetal Evaluation (Fetus A)  Num Of Fetuses:         2  Fetal Heart Rate(bpm):  138  Cardiac Activity:       Observed  Fetal Lie:              Lower Fetus  Presentation:           Breech  Placenta:               Posterior  P. Cord Insertion:      Previously Visualized  Amniotic Fluid  AFI FV:      Within normal limits                               Largest Pocket(cm)                              2.9 ---------------------------------------------------------------------- Biophysical Evaluation (Fetus A)  Amniotic F.V:   Within normal limits       F. Tone:        Observed  F. Movement:    Observed                   Score:          8/8  F. Breathing:   Observed ---------------------------------------------------------------------- OB History  Gravidity:    2         Term:   1        Prem:   0        SAB:   0  TOP:          0       Ectopic:  0        Living: 1 ---------------------------------------------------------------------- Gestational Age (Fetus A)  LMP:           34w 2d        Date:  02/03/21                 EDD:   11/10/21  Best:          34w 2d     Det. By:  LMP  (02/03/21)          EDD:   11/10/21 ---------------------------------------------------------------------- Anatomy (Fetus A)  Ventricles:            Appears normal         Kidneys:                Appear normal  Diaphragm:             Appears normal         Bladder:                Appears normal  Stomach:               Appears normal, left                         sided ---------------------------------------------------------------------- Fetal Evaluation (Fetus B)  Num Of Fetuses:         2  Fetal Heart Rate(bpm):  137  Cardiac Activity:       Observed  Fetal Lie:  Upper Fetus  Presentation:           Breech  Placenta:               Anterior  P. Cord Insertion:      Previously Visualized  Amniotic Fluid  AFI FV:      Within normal limits                              Largest Pocket(cm)                              3.16 ---------------------------------------------------------------------- Biophysical Evaluation (Fetus B)  Amniotic F.V:   Within normal limits       F. Tone:        Observed  F. Movement:    Observed                   Score:          8/8  F. Breathing:   Observed ---------------------------------------------------------------------- Gestational Age (Fetus B)  LMP:            34w 2d        Date:  02/03/21                 EDD:   11/10/21  Best:          34w 2d     Det. By:  LMP  (02/03/21)          EDD:   11/10/21 ---------------------------------------------------------------------- Anatomy (Fetus B)  Ventricles:            Appears normal         Kidneys:                Appear normal  Diaphragm:             Appears normal         Bladder:                Appears normal  Stomach:               Appears normal, left                         sided ---------------------------------------------------------------------- Impression  31y G2P1 at [redacted]w[redacted]d w/ Di-Di twins, HCV, BMI 46 here for  BPPs.  Intrauterine pregnancy x 2 at [redacted]w[redacted]d with normal fetal cardiac  activity x 2.  Twin A  Breech  Normal appearing interval anatomy, previous anatomy seen  Normal amniotic fluid, AFI 2.9 cm  BPP 8/8  Twin B  Breech  Normal appearing interval anatomy, previous anatomy seen  Normal amniotic fluid, AFI 3.1 cm  BPP 8/8 ---------------------------------------------------------------------- Recommendations  I discussed that timing of delivery for di-di twins without  growth complications is typically at [redacted]w[redacted]d to [redacted]w[redacted]d,  however, given her elevated BMI and type 2 DM,  consideration for delivery at 37w can also be considered.  After using shared decision making, the patient elected to  have her cesarean during the 37w of pregnancy given her  additional comorbidities. She will schedule this after today's  visit.  Fetal discordance of 13% (normal is < 20%) based on prior  US, next growth will be at her 36w visit.  Continue weekly BPPs until delivery.  Standard OB precautions discussed and the patient  verbalized understanding. ----------------------------------------------------------------------  Braxton Feathers, DO Electronically Signed Final Report   10/01/2021 02:45 pm ----------------------------------------------------------------------  [place] MFM FETAL BPP WO NST ADDL GESTATION  Result Date:  10/01/2021 ----------------------------------------------------------------------  OBSTETRICS REPORT                       (Signed Final 10/01/2021 02:45 pm) ---------------------------------------------------------------------- Patient Info  ID #:       [id number]                          D.O.B.:  1990/03/12 (31 yrs)  Name:       ANAELLE DUNTON [facility]                    Visit Date: 10/01/2021 01:14 pm ---------------------------------------------------------------------- Performed By  Attending:        Braxton Feathers, DO      Ref. Address:     [address]                                                             [place], [place]                                                             [address]  Performed By:     Reinaldo Raddle            Location[facility]                                     [facility]  Referred By:      [place] Bing MD ---------------------------------------------------------------------- Orders  #  Description                           Code        Ordered By  1  [place] MFM FETAL BPP WO NON               [phone]  YU FANG     STRESS  2  [place] MFM FETAL BPP WO NST               76819.1     Rosana Hoes     ADDL GESTATION ----------------------------------------------------------------------  #  Order #                     Accession #                Episode #  1  [id number]                   [id number]                 [id number]  2  [id number]                   [id number]                 [id number] ---------------------------------------------------------------------- Indications  Twin pregnancy, di/di, third trimester         O30.043  Pre-existing diabetes, type 2, in pregnancy,   O24.113  third trimester (insulin)  Obesity complicating pregnancy, third          O99.213  trimester (BMI 46)  Antenatal screening for elevated                Z36.1  alphafetoprotein level (AFP 3.27)  Abnormal chromosomal and genetic finding       O28.5  on antenatal screening of mother  Chronic Hepatitis C complicating pregnancy,    O98.419 B18.2  antepartum  Low Risk NIPS/Negative Horizon  Fetal echo wnl x2  [redacted] weeks gestation of pregnancy                Z3A.34 ---------------------------------------------------------------------- Fetal Evaluation (Fetus A)  Num Of Fetuses:         2  Fetal Heart Rate(bpm):  138  Cardiac Activity:       Observed  Fetal Lie:              Lower Fetus  Presentation:           Breech  Placenta:               Posterior  P. Cord Insertion:      Previously Visualized  Amniotic Fluid  AFI FV:      Within normal limits                              Largest Pocket(cm)                              2.9 ---------------------------------------------------------------------- Biophysical Evaluation (Fetus A)  Amniotic F.V:   Within normal limits       F. Tone:        Observed  F. Movement:    Observed                   Score:          8/8  F. Breathing:   Observed ---------------------------------------------------------------------- OB History  Gravidity:    2         Term:   1        Prem:   0        SAB:   0  TOP:  0       Ectopic:  0        Living: 1 ---------------------------------------------------------------------- Gestational Age (Fetus A)  LMP:           34w 2d        Date:  02/03/21                 EDD:   11/10/21  Best:          34w 2d     Det. By:  LMP  (02/03/21)          EDD:   11/10/21 ---------------------------------------------------------------------- Anatomy (Fetus A)  Ventricles:            Appears normal         Kidneys:                Appear normal  Diaphragm:             Appears normal         Bladder:                Appears normal  Stomach:               Appears normal, left                         sided ---------------------------------------------------------------------- Fetal Evaluation (Fetus B)  Num Of  Fetuses:         2  Fetal Heart Rate(bpm):  137  Cardiac Activity:       Observed  Fetal Lie:              Upper Fetus  Presentation:           Breech  Placenta:               Anterior  P. Cord Insertion:      Previously Visualized  Amniotic Fluid  AFI FV:      Within normal limits                              Largest Pocket(cm)                              3.16 ---------------------------------------------------------------------- Biophysical Evaluation (Fetus B)  Amniotic F.V:   Within normal limits       F. Tone:        Observed  F. Movement:    Observed                   Score:          8/8  F. Breathing:   Observed ---------------------------------------------------------------------- Gestational Age (Fetus B)  LMP:           34w 2d        Date:  02/03/21                 EDD:   11/10/21  Best:          34w 2d     Det. By:  LMP  (02/03/21)          EDD:   11/10/21 ---------------------------------------------------------------------- Anatomy (Fetus B)  Ventricles:            Appears normal         Kidneys:  Appear normal  Diaphragm:             Appears normal         Bladder:                Appears normal  Stomach:               Appears normal, left                         sided ---------------------------------------------------------------------- Impression  31y G2P1 at [redacted]w[redacted]d w/ Di-Di twins, HCV, BMI 46 here for  BPPs.  Intrauterine pregnancy x 2 at [redacted]w[redacted]d with normal fetal cardiac  activity x 2.  Twin A  Breech  Normal appearing interval anatomy, previous anatomy seen  Normal amniotic fluid, AFI 2.9 cm  BPP 8/8  Twin B  Breech  Normal appearing interval anatomy, previous anatomy seen  Normal amniotic fluid, AFI 3.1 cm  BPP 8/8 ---------------------------------------------------------------------- Recommendations  I discussed that timing of delivery for di-di twins without  growth complications is typically at [redacted]w[redacted]d to [redacted]w[redacted]d,  however, given her elevated BMI and type 2 DM,  consideration for  delivery at 37w can also be considered.  After using shared decision making, the patient elected to  have her cesarean during the 37w of pregnancy given her  additional comorbidities. She will schedule this after today's  visit.  Fetal discordance of 13% (normal is < 20%) based on prior  US, next growth will be at her 36w visit.  Continue weekly BPPs until delivery.  Standard OB precautions discussed and the patient  verbalized understanding. ----------------------------------------------------------------------                  Braxton Feathers, DO Electronically Signed Final Report   10/01/2021 02:45 pm ----------------------------------------------------------------------     Assessment and Plan:   Problem List Items Addressed This Visit       Endocrine   Type 2 diabetes mellitus without complication ([name])   Relevant Medications   insulin glargine (LANTUS) 100 UNIT/ML injection   insulin glargine (LANTUS) 100 UNIT/ML Solostar Pen     Musculoskeletal and Integument   Skin breakdown    Small area of skin breakdown. Bacitracin, ABD pads, regular wound care. Some mild induration and erythema but low suspicion for infection, suspect induration more related to tissue edema. Regardless taking antibiotics for abscess.       Relevant Medications   bacitracin 500 UNIT/GM ointment     Other   Abscess    Fluctuant mass palpated on exam though still relatively small (see photo). Discussed option of antibiotics with close follow up vs I&D. Patient reported she felt like she has been through this a few times before and they have never gotten better without I&D. Uncomplicated I&D performed (see procedure note below), will also do 5 day course of Clindamycin 450 mg TID.   Incision and Drainage Procedure Note Management options discussed with patient, they elected to proceed with incision and drainage. Reviewed risks of bleeding, worsening infection, pain, scarring, inability to resolve infection or recurrence  of the infection. Verbal and written consent obtained.  Area cleaned with alcohol wipe. Superficial tissue injected with 3 cc's of 2% lidocaine. Adequate time allowed for anesthesia to become effective. A #11 blade was then used to open the abscess, and a 1.5 cm linear incision was created. A wound culture was obtained. Loculations were then disrupted using a hemostat. A small amount of purulent-bloody fluid was evacuated.  Once all fluid had been expressed, wound was dressed with gauze. Patient tolerated procedure well.         Relevant Medications   clindamycin (CLEOCIN) 150 MG capsule   Other Visit Diagnoses     Visit for wound check    -  Primary       Routine preventative health maintenance measures emphasized. Please refer to After Visit Summary for other counseling recommendations.   Return in about 1 week (around 11/05/2021) for wound check.    Total face-to-face time with patient: 20 minutes.  Over 50% of encounter was spent on counseling and coordination of care.   Venora MaplesMatthew M Masaji Billups, MD/MPH Attending Family Medicine Physician, [facility], [facility]

## 2021-10-29 NOTE — Progress Notes (Signed)
Incision Check Visit  Crystal Castillo is here for incision check following primary c-section on 10/22/21. Provena wound vac removed.  Assessment:  Education: Reviewed good wound care and s/s of infection with patient.  Patient will follow up nurse visit in 1 week.  Marjo Bicker, RN 10/29/2021  9:18 AM

## 2021-10-29 NOTE — Assessment & Plan Note (Addendum)
Small area of skin breakdown. Bacitracin, ABD pads, regular wound care. Some mild induration and erythema but low suspicion for infection, suspect induration more related to tissue edema. Regardless taking antibiotics for abscess.

## 2021-11-02 LAB — WOUND CULTURE: Organism ID, Bacteria: NONE SEEN

## 2021-11-03 ENCOUNTER — Encounter: Payer: Self-pay | Admitting: Family Medicine

## 2021-11-05 ENCOUNTER — Other Ambulatory Visit: Payer: Self-pay

## 2021-11-05 ENCOUNTER — Ambulatory Visit (INDEPENDENT_AMBULATORY_CARE_PROVIDER_SITE_OTHER): Payer: Medicaid Other | Admitting: General Practice

## 2021-11-05 ENCOUNTER — Encounter: Payer: Self-pay | Admitting: Family Medicine

## 2021-11-05 VITALS — BP 113/70 | HR 90 | Ht 67.0 in | Wt 275.0 lb

## 2021-11-05 DIAGNOSIS — Z5189 Encounter for other specified aftercare: Secondary | ICD-10-CM | POA: Diagnosis not present

## 2021-11-05 NOTE — Progress Notes (Signed)
Patient presents to office today with concerns about c-section incision. She states she feels like the wound on her incision is "getting longer". 2cm area of skin breakdown noted on left side of incision. No odor detected. Dr Dione Plover will come in to assess incision.   Koren Bound RN BSN 11/05/21

## 2021-11-12 ENCOUNTER — Encounter: Payer: Self-pay | Admitting: Family Medicine

## 2021-11-12 ENCOUNTER — Other Ambulatory Visit: Payer: Self-pay

## 2021-11-13 ENCOUNTER — Other Ambulatory Visit: Payer: Self-pay

## 2021-11-13 ENCOUNTER — Ambulatory Visit (INDEPENDENT_AMBULATORY_CARE_PROVIDER_SITE_OTHER): Payer: Medicaid Other | Admitting: General Practice

## 2021-11-13 VITALS — BP 116/62 | HR 85 | Ht 67.0 in | Wt 274.0 lb

## 2021-11-13 DIAGNOSIS — Z5189 Encounter for other specified aftercare: Secondary | ICD-10-CM

## 2021-11-13 DIAGNOSIS — G8918 Other acute postprocedural pain: Secondary | ICD-10-CM

## 2021-11-13 MED ORDER — CEPHALEXIN 500 MG PO CAPS: 500.0000 mg | ORAL_CAPSULE | Freq: Three times a day (TID) | ORAL | 0 refills | Status: AC

## 2021-11-13 MED ORDER — IBUPROFEN 600 MG PO TABS: 600.0000 mg | ORAL_TABLET | Freq: Four times a day (QID) | ORAL | 0 refills | Status: DC

## 2021-11-13 NOTE — Progress Notes (Signed)
Patient presents to office today for wound check following up from last week. She states her husband thinks the incision is slowly improving from a week ago. 2cm x 2cm yellowish open area above incision on left side persistently present from last week, no tunneling visible. Also noted is some redness around incision as well. Dr Rip Harbour in to assess incision.   Koren Bound RN BSN 11/13/21

## 2021-11-20 ENCOUNTER — Other Ambulatory Visit: Payer: Self-pay

## 2021-11-20 ENCOUNTER — Encounter: Payer: Self-pay | Admitting: Obstetrics and Gynecology

## 2021-11-20 ENCOUNTER — Ambulatory Visit (INDEPENDENT_AMBULATORY_CARE_PROVIDER_SITE_OTHER): Payer: Medicaid Other | Admitting: Obstetrics and Gynecology

## 2021-11-20 DIAGNOSIS — Z9851 Tubal ligation status: Secondary | ICD-10-CM | POA: Diagnosis not present

## 2021-11-20 DIAGNOSIS — Z98891 History of uterine scar from previous surgery: Secondary | ICD-10-CM | POA: Diagnosis not present

## 2021-11-20 NOTE — Progress Notes (Signed)
    Post Partum Visit Note  Crystal Castillo is a 31 y.o. [id number] female who presents for a postpartum visit. She is 4 weeks postpartum following a repeat cesarean section.  I have fully reviewed the prenatal and intrapartum course. The delivery was at [redacted]w[redacted]d gestational weeks.  Anesthesia: spinal. Postpartum course has been unremarkable. Baby is doing well. Baby is feeding by breast. Bleeding no bleeding. Bowel function is normal. Bladder function is normal. Patient is sexually active. Contraception method is tubal ligation. Postpartum depression screening: negative.   The pregnancy intention screening data noted above was reviewed. Potential methods of contraception were discussed. The patient elected to proceed with No data recorded.    Health Maintenance Due  Topic Date Due   FOOT EXAM  Never done   OPHTHALMOLOGY EXAM  Never done   COVID-19 Vaccine (3 - Pfizer series) 09/18/2019   INFLUENZA VACCINE  09/16/2021   HEMOGLOBIN A1C  10/08/2021    Medical Records  Review of Systems Pertinent items noted in HPI and remainder of comprehensive ROS otherwise negative.  Objective:  LMP 02/03/2021    General:  alert   Breasts:  not indicated  Lungs: clear to auscultation bilaterally  Heart:  regular rate and rhythm, S1, S2 normal, no murmur, click, rub or gallop  Abdomen: soft, non-tender; bowel sounds normal; no masses,  no organomegaly   Wound Healing  GU exam:  not indicated       Assessment:    1. Encounter for routine postpartum follow-up   NL postpartum exam.   Plan:   Essential components of care per ACOG recommendations:  1.  Mood and well being: Patient with negative depression screening today. Reviewed local resources for support.  - Patient tobacco use? No.   - hx of drug use? No.    2. Infant care and feeding:  -Patient currently breastmilk feeding? No.  -Social determinants of health (SDOH) reviewed in EPIC. No concerns  3. Sexuality, contraception and birth  spacing - Patient does not want a pregnancy in the next year.  Desired family size is completed  - Reviewed reproductive life planning. Reviewed contraceptive methods based on pt preferences and effectiveness.  - Discussed birth spacing of 18 months  4. Sleep and fatigue -Encouraged family/partner/community support of 4 hrs of uninterrupted sleep to help with mood and fatigue  5. Physical Recovery  - Discussed patients delivery and complications. She describes her labor as good. - Patient had a C-section with BTL. - Patient has urinary incontinence? No. - Patient is safe to resume physical and sexual activity  6.  Health Maintenance - HM due items addressed Yes - Last pap smear  Diagnosis  Date Value Ref Range Status  04/10/2021   Final   - Negative for intraepithelial lesion or malignancy (NILM)   Pap smear not done at today's visit.  -Breast Cancer screening indicated? No.   7. Chronic Disease/Pregnancy Condition follow up: None    Arlina Robes, MD Center for [facility], [place]

## 2021-11-20 NOTE — Patient Instructions (Signed)

## 2021-11-21 ENCOUNTER — Ambulatory Visit: Payer: Medicaid Other

## 2021-11-25 ENCOUNTER — Ambulatory Visit: Payer: Self-pay | Admitting: Family Medicine
